# Patient Record
Sex: Female | Born: 1955 | Race: White | Hispanic: No | Smoking: Never smoker
Health system: Southern US, Community
[De-identification: ages and names within clinical notes are randomized; demographics above are authoritative.]

## PROBLEM LIST (undated history)

## (undated) DIAGNOSIS — R609 Edema, unspecified: Secondary | ICD-10-CM

## (undated) DIAGNOSIS — I10 Essential (primary) hypertension: Secondary | ICD-10-CM

## (undated) DIAGNOSIS — I83899 Varicose veins of unspecified lower extremities with other complications: Secondary | ICD-10-CM

## (undated) DIAGNOSIS — E0500 Thyrotoxicosis with diffuse goiter without thyrotoxic crisis or storm: Secondary | ICD-10-CM

## (undated) DIAGNOSIS — I83893 Varicose veins of bilateral lower extremities with other complications: Secondary | ICD-10-CM

## (undated) DIAGNOSIS — R600 Localized edema: Secondary | ICD-10-CM

## (undated) DIAGNOSIS — D649 Anemia, unspecified: Secondary | ICD-10-CM

## (undated) DIAGNOSIS — L309 Dermatitis, unspecified: Secondary | ICD-10-CM

## (undated) DIAGNOSIS — E785 Hyperlipidemia, unspecified: Secondary | ICD-10-CM

## (undated) DIAGNOSIS — E669 Obesity, unspecified: Secondary | ICD-10-CM

## (undated) DIAGNOSIS — I8390 Asymptomatic varicose veins of unspecified lower extremity: Secondary | ICD-10-CM

## (undated) DIAGNOSIS — I319 Disease of pericardium, unspecified: Secondary | ICD-10-CM

## (undated) DIAGNOSIS — E079 Disorder of thyroid, unspecified: Secondary | ICD-10-CM

## (undated) HISTORY — DX: Essential (primary) hypertension: I10

## (undated) HISTORY — DX: Obesity, unspecified: E66.9

## (undated) HISTORY — DX: Edema, unspecified: R60.9

## (undated) HISTORY — PX: BREAST BIOPSY: SHX20

## (undated) HISTORY — DX: Thyrotoxicosis with diffuse goiter without thyrotoxic crisis or storm: E05.00

## (undated) HISTORY — DX: Varicose veins of bilateral lower extremities with other complications: I83.893

## (undated) HISTORY — PX: SKIN GRAFT: SHX250

## (undated) HISTORY — DX: Dermatitis, unspecified: L30.9

## (undated) HISTORY — DX: Varicose veins of unspecified lower extremity with other complications: I83.899

## (undated) HISTORY — DX: Localized edema: R60.0

## (undated) HISTORY — DX: Disorder of thyroid, unspecified: E07.9

## (undated) HISTORY — DX: Asymptomatic varicose veins of unspecified lower extremity: I83.90

## (undated) HISTORY — DX: Hyperlipidemia, unspecified: E78.5

## (undated) HISTORY — DX: Anemia, unspecified: D64.9

## (undated) HISTORY — DX: Disease of pericardium, unspecified: I31.9

## (undated) HISTORY — PX: CHOLECYSTECTOMY: SHX55

## (undated) HISTORY — PX: ENDOVENOUS ABLATION SAPHENOUS VEIN W/ LASER: SUR449

---

## 1999-11-23 ENCOUNTER — Ambulatory Visit (HOSPITAL_COMMUNITY): Admission: RE | Admit: 1999-11-23 | Discharge: 1999-11-23 | Payer: Self-pay | Admitting: Orthopedic Surgery

## 1999-11-23 ENCOUNTER — Encounter: Payer: Self-pay | Admitting: Orthopedic Surgery

## 1999-12-06 ENCOUNTER — Other Ambulatory Visit: Admission: RE | Admit: 1999-12-06 | Discharge: 1999-12-06 | Payer: Self-pay | Admitting: Gynecology

## 2001-06-03 ENCOUNTER — Other Ambulatory Visit: Admission: RE | Admit: 2001-06-03 | Discharge: 2001-06-03 | Payer: Self-pay | Admitting: Gynecology

## 2002-09-11 ENCOUNTER — Encounter: Payer: Self-pay | Admitting: Family Medicine

## 2002-09-11 ENCOUNTER — Encounter: Admission: RE | Admit: 2002-09-11 | Discharge: 2002-09-11 | Payer: Self-pay | Admitting: Family Medicine

## 2002-11-02 ENCOUNTER — Other Ambulatory Visit: Admission: RE | Admit: 2002-11-02 | Discharge: 2002-11-02 | Payer: Self-pay | Admitting: Gynecology

## 2003-05-07 ENCOUNTER — Encounter: Admission: RE | Admit: 2003-05-07 | Discharge: 2003-05-07 | Payer: Self-pay | Admitting: Orthopedic Surgery

## 2004-04-20 ENCOUNTER — Other Ambulatory Visit: Admission: RE | Admit: 2004-04-20 | Discharge: 2004-04-20 | Payer: Self-pay | Admitting: Gynecology

## 2009-01-10 ENCOUNTER — Encounter: Admission: RE | Admit: 2009-01-10 | Discharge: 2009-01-10 | Payer: Self-pay | Admitting: Gynecology

## 2010-04-17 ENCOUNTER — Other Ambulatory Visit: Payer: Self-pay | Admitting: Gynecology

## 2010-04-17 DIAGNOSIS — Z1231 Encounter for screening mammogram for malignant neoplasm of breast: Secondary | ICD-10-CM

## 2010-04-19 ENCOUNTER — Ambulatory Visit
Admission: RE | Admit: 2010-04-19 | Discharge: 2010-04-19 | Disposition: A | Discharge status: Home / Self Care | Payer: Commercial Indemnity | Source: Ambulatory Visit | Attending: Gynecology | Admitting: Gynecology

## 2010-04-19 DIAGNOSIS — Z1231 Encounter for screening mammogram for malignant neoplasm of breast: Secondary | ICD-10-CM

## 2010-05-04 ENCOUNTER — Other Ambulatory Visit: Payer: Self-pay | Admitting: Gynecology

## 2010-05-04 DIAGNOSIS — N950 Postmenopausal bleeding: Secondary | ICD-10-CM

## 2010-05-10 ENCOUNTER — Ambulatory Visit
Admission: RE | Admit: 2010-05-10 | Discharge: 2010-05-10 | Disposition: A | Discharge status: Home / Self Care | Payer: Commercial Indemnity | Source: Ambulatory Visit | Attending: Gynecology | Admitting: Gynecology

## 2010-05-10 DIAGNOSIS — N950 Postmenopausal bleeding: Secondary | ICD-10-CM

## 2010-05-30 ENCOUNTER — Observation Stay (HOSPITAL_COMMUNITY)
Admission: EM | Admit: 2010-05-30 | Discharge: 2010-05-31 | Disposition: A | Discharge status: Home / Self Care | Payer: Commercial Indemnity | Attending: Internal Medicine | Admitting: Internal Medicine

## 2010-05-30 ENCOUNTER — Emergency Department (HOSPITAL_COMMUNITY): Payer: Commercial Indemnity

## 2010-05-30 DIAGNOSIS — E0500 Thyrotoxicosis with diffuse goiter without thyrotoxic crisis or storm: Secondary | ICD-10-CM | POA: Insufficient documentation

## 2010-05-30 DIAGNOSIS — R030 Elevated blood-pressure reading, without diagnosis of hypertension: Secondary | ICD-10-CM | POA: Insufficient documentation

## 2010-05-30 DIAGNOSIS — E669 Obesity, unspecified: Secondary | ICD-10-CM | POA: Insufficient documentation

## 2010-05-30 DIAGNOSIS — R0789 Other chest pain: Principal | ICD-10-CM | POA: Insufficient documentation

## 2010-05-30 LAB — POCT I-STAT, CHEM 8
HCT: 36 % (ref 36.0–46.0)
Hemoglobin: 12.2 g/dL (ref 12.0–15.0)
Potassium: 3.8 mEq/L (ref 3.5–5.1)
Sodium: 140 mEq/L (ref 135–145)
TCO2: 27 mmol/L (ref 0–100)

## 2010-05-30 LAB — DIFFERENTIAL
Basophils Absolute: 0.1 10*3/uL (ref 0.0–0.1)
Basophils Relative: 1 % (ref 0–1)
Lymphocytes Relative: 31 % (ref 12–46)
Monocytes Absolute: 0.6 10*3/uL (ref 0.1–1.0)
Neutro Abs: 4.8 10*3/uL (ref 1.7–7.7)

## 2010-05-30 LAB — CBC
HCT: 37.2 % (ref 36.0–46.0)
Hemoglobin: 12.7 g/dL (ref 12.0–15.0)
MCHC: 34.1 g/dL (ref 30.0–36.0)
RDW: 14.1 % (ref 11.5–15.5)
WBC: 8 10*3/uL (ref 4.0–10.5)

## 2010-05-30 LAB — POCT CARDIAC MARKERS
CKMB, poc: 2.8 ng/mL (ref 1.0–8.0)
Myoglobin, poc: 84 ng/mL (ref 12–200)

## 2010-05-31 DIAGNOSIS — R072 Precordial pain: Secondary | ICD-10-CM

## 2010-05-31 LAB — COMPREHENSIVE METABOLIC PANEL
AST: 29 U/L (ref 0–37)
BUN: 15 mg/dL (ref 6–23)
CO2: 26 mEq/L (ref 19–32)
Calcium: 8.9 mg/dL (ref 8.4–10.5)
Chloride: 106 mEq/L (ref 96–112)
Creatinine, Ser: 0.85 mg/dL (ref 0.4–1.2)
GFR calc non Af Amer: >60 mL/min (ref >60)
Glucose, Bld: 108 mg/dL — ABNORMAL HIGH (ref 70–99)
Total Bilirubin: 0.8 mg/dL (ref 0.3–1.2)

## 2010-05-31 LAB — CARDIAC PANEL(CRET KIN+CKTOT+MB+TROPI)
CK, MB: 2.5 ng/mL (ref 0.3–4.0)
Relative Index: INVALID (ref 0.0–2.5)
Relative Index: 2.1 (ref 0.0–2.5)
Total CK: 120 U/L (ref 7–177)
Troponin I: 0.02 ng/mL (ref 0.00–0.06)

## 2010-05-31 LAB — CBC
MCH: 29.2 pg (ref 26.0–34.0)
MCHC: 33.5 g/dL (ref 30.0–36.0)
Platelets: 227 10*3/uL (ref 150–400)
RDW: 14.2 % (ref 11.5–15.5)

## 2010-05-31 LAB — CK TOTAL AND CKMB (NOT AT ARMC)
Relative Index: INVALID (ref 0.0–2.5)
Total CK: 98 U/L (ref 7–177)

## 2010-05-31 LAB — LIPID PANEL
Cholesterol: 189 mg/dL (ref 0–200)
HDL: 36 mg/dL — ABNORMAL LOW (ref >39)
LDL Cholesterol: 115 mg/dL — ABNORMAL HIGH (ref 0–99)
Total CHOL/HDL Ratio: 5.3 RATIO
VLDL: 38 mg/dL (ref 0–40)

## 2010-05-31 LAB — POCT CARDIAC MARKERS: Myoglobin, poc: 81.9 ng/mL (ref 12–200)

## 2010-05-31 LAB — MAGNESIUM: Magnesium: 2.1 mg/dL (ref 1.5–2.5)

## 2010-05-31 LAB — TSH: TSH: 2.409 u[IU]/mL (ref 0.350–4.500)

## 2010-05-31 NOTE — H&P (Signed)
NAME:  Cheryl [name], Cheryl [name]                ACCOUNT NO.:  [id number]  MEDICAL RECORD NO.:  [id number]           PATIENT TYPE:  E  LOCATION:  [facility]                         FACILITY:  [facility]  PHYSICIAN:  Talmage Nap, MD  DATE OF BIRTH:  1955-09-06  DATE OF ADMISSION:  05/30/2010 DATE OF DISCHARGE:                             HISTORY & PHYSICAL   PRIMARY CARE PHYSICIAN:  Unassigned.  History obtainable from the patient.  CHIEF COMPLAINT:  Chest pain of 1 day duration.  The patient is a 55 year old obese Caucasian female with history of Graves disease on Synthroid replacement presenting to the emergency room with chest pain of 1 day duration, which started 24 hours prior to presentation.  The patient claimed that she was at home, at rest and developed retrosternal chest pain about 4/10 in intensity and was nonradiating.  This was said to be associated with feeling of gas in her chest.  She sometimes described the pain as pressure like.  There was no radiation of pain, but the patient claimed she had some discomfort on deep inspiration.  She denied any history of diaphoresis.  She denied any history of nausea or vomiting.  No fever or chills.  No rigor.  Pain was said to have been transient and subsided.  There was no known relieving or aggravating factor. However, the patient had a second episode today and subsequently decided to come to the emergency room to be evaluated.  PAST MEDICAL HISTORY:  Positive for Graves disease.  PAST SURGICAL HISTORY:  Cholecystectomy and skin graft following a washing machine accident.  Preadmission meds include aspirin dose unknown, Synthroid dose unknown.  ALLERGIES:  COUGH SYRUP with ANTIHISTAMINE.  SOCIAL HISTORY:  Negative for alcohol or tobacco use.  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  Family history is said to be positive for coronary artery disease.  REVIEW OF SYSTEMS:  The patient denies any  history of headaches.  No blurred vision.  No nausea or vomiting.  No fever.  No chills.  No rigor.  Presently denies any chest pain or discomfort, but claimed that the pain is now in the precordial region.  Denies any radiation of pain. No cough.  Denies any PND or orthopnea.  No abdominal discomfort.  No diarrhea or hematochezia.  No dysuria or hematuria.  Periodically have swelling of the lower extremity with area of irregular hyperemia in the lower extremity.  No intolerance to heat, cold.  No neuropsychiatric disorder.  PHYSICAL EXAMINATION:  GENERAL:  On examination, very pleasant middle- aged lady, not in any obvious respiratory distress. VITAL SIGNS:  Blood pressure is 192/92, pulse 83, respiratory rate is 19, temperature is 97.4. HEENT:  Pupils are reactive to light and extraocular muscles are intact. NECK:  No jugular venous distention.  No carotid bruit.  No lymphadenopathy. CHEST:  Clear to auscultation. HEART:  Heart sounds are 1 and 2. ABDOMEN:  Soft, nontender.  Liver, spleen, kidney not palpable.  Bowel sounds are positive. EXTREMITIES:  Trace edema with areas of irregular hyperemia in the lower leg.  No intolerance to heat or cold.  NEURO:  No neuropsychiatric disorder.  LABORATORY DATA:  First set of cardiac markers; troponin-I less than 0.05, CK-MB 2.8.  Hematological indices showed WBC of 8.0, hemoglobin of 12.7, hematocrit 37.2, MCV 87.2, platelet count of 232, normal differentials.  Chemistry shows sodium of 140, potassium of 3.8, chloride of 105, BUN is 18, creatinine is 1.0, glucose is 92.  Imaging studies done include chest x-ray, which showed borderline cardiomegaly without any acute disease.  EKG showed normal sinus rhythm with nonspecific ST-wave changes in the anterolateral leads.  ADMITTING IMPRESSION: 1. Chest pain, rule out acute coronary syndrome. 2. Graves disease. 3. Obesity. 4. Elevated blood pressure. 5. 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  PLAN:  Admit the patient to telemetry.  The patient will be on aspirin 325 mg p.o. daily, morphine 2 mg IV q.4 p.r.n. for chest pain, and nitroglycerin 0.4 mg sublingual p.r.n. for chest pain.  She will also be restarted on Synthroid 50 mcg p.o. daily for now, when exact dose is known will be changed.  Elevated blood pressure will be controlled with lisinopril 10 mg p.o. daily.  She will be on Protonix 40 mg p.o. daily for GI prophylaxis as well as Mylanta 10 mL p.o. t.i.d. for dyspepsia. GI prophylaxis will be with Lovenox 40 mg subcu q.24 h.  Further labs to be ordered on this patient will include cardiac enzymes q.6 x3, lipid panel, thyroid panel, which include TSH, T3, and T4, 2-D echo will also be done.  CBC, CMP, and magnesium will be repeated in a.m.  The patient will be followed and evaluated on day-to-day basis.     Talmage Nap, MD     CN/MEDQ  D:  05/30/2010  T:  05/30/2010  Job:  [id number]  Electronically Signed by Talmage Nap  on 05/31/2010 01:01:53 AM

## 2010-06-02 NOTE — Discharge Summary (Signed)
NAME:  Cheryl [name], Cheryl [name]                ACCOUNT NO.:  [id number]  MEDICAL RECORD NO.:  [id number]           PATIENT TYPE:  I  LOCATION:  2020                         FACILITY:  [facility]  PHYSICIAN:  Marinda Elk, M.D.DATE OF BIRTH:  February 09, 1955  DATE OF ADMISSION:  05/30/2010 DATE OF DISCHARGE:  05/31/2010                              DISCHARGE SUMMARY   PRIMARY CARE DOCTOR:  Dr. Andrey Campanile over at [facility].  DISCHARGE DIAGNOSES: 1. Atypical chest pain with cardiac markers negative x3.  EKG with     sinus rhythm. 2. Graves disease, 3. Elevated blood pressure.  DISCHARGE MEDICATIONS: 1. Tylenol 500 mg p.o. at bedtime p.r.n. 2. Aspirin 325 mg daily. 3. Synthroid 175 mcg daily. 4. Vitamin B6 1 tablet daily. 5. Pravachol 40 mg p.o. daily.  PROCEDURES PERFORMED: 1. Two-D echo which is pending at the time of the dictation, which     will be followed up by Dr. Andrey Campanile. 2. Chest x-ray that showed borderline cardiomegaly without any acute     pulmonary disease.  CONSULTANTS:  None.  BRIEF ADMITTING H AND P:  This is a 55 year old female with past medical history of Graves disease, who came to the emergency room for chest pain 1 day prior to coming to the hospital, it is intermittent.  She relates she was at home.  She was sitting in chair when she developed retrosternal chest pain, 4/10, nonradiating, was associated with gas in her chest.  She describes sometimes the pain as pressure like, but the patient claimed that it is some discomfort with inspiration.  She denies any diaphoresis.  Denies any history of nausea, vomiting, chills, rigor. There were no alleviating or aggravating factors.  This happened twice, lasted about a minute or so.  PHYSICAL EXAMINATION:  VITAL SIGNS:  Blood pressure 192/92, pulse of 83, respiration of 19, temperature 97.  HEENT:  Pupils equally round and reactive to light. NECK:  No JVD.  No carotid bruits.  No  thyromegaly. CARDIOVASCULAR:  She has a regular rate and rhythm with distant heart sounds, S1 and S2. LUNGS:  Good air movement.  Clear to auscultation. ABDOMEN:  Positive bowel sounds, nontender, nondistended, soft. EXTREMITIES:  Trace edema with areas of hyperemia in the legs and two knots posterior.  It looks like chronic venous insufficiency. NEUROLOGIC:  Nonfocal.  Labs on admission showed white count of 8.0, hemoglobin of 12, platelet count of 232, ANC of 4.8.  Her two sets of point of cardiac care markers are negative x2.  Her triglycerides are 190, HDL 36, LDL 115.  Her white count is 6.1, hemoglobin of 12.3, platelet count of 227.  Third set of cardiac enzymes is negative.  Sodium 141, potassium 4.1, chloride 106, bicarb 26, glucose 108, BUN of 15, creatinine 0.5.  LFTs within normal limits except for an AST, which is 50.  Her mag is 2.1.  ASSESSMENT AND PLAN: 1. Atypical chest pain.  Monitor on telemetry with no events,     currently in sinus rhythm with nonspecific T-wave changes on EKG,     but in V2.  She has a normal  axis, normal intervals, and no ST-     segment changes.  Cardiac enzymes were negative x3 repeated after     the point of cardiac care markers.  The chest pain is reproducible     by palpation.  A two-D echo is pending at the time of the     dictation.  She will be followed up with [facility] for     stress test as an outpatient.  Results will be sent to her primary     care doctor. 2. Graves disease.  Continue Synthroid.  No changes were made. 3. Elevated blood pressure.  She was started on lisinopril.  On     admission, her blood pressure was 190, now in the hospital was     95/58.  This is just one measurement.  I will not start her on any     blood pressure medication at this time. 4. Hyperlipidemia.  We will start her on statin, Pravachol.  Her LFTs     were checked and were within normal limits.  LABS ON THE DAY OF DISCHARGE:  Cardiac  enzymes continued to be negative. They were TI 0.01, before that it was 0.01.  Her MB was 2.5, before that it was 3.4.  Her TSH was checked, it was 2.4.  Her T3 and T4 was 1.2.  Vitals on the day of discharge show a temperature 98, pulse 76, respirations 18, blood pressure 95/58, she was satting 95% on room air.  DISPOSITION:  The patient will follow up with [facility] cardiologist for an outpatient stress test, and the results will be sent to her primary care doctor.     Marinda Elk, M.D.     AF/MEDQ  D:  05/31/2010  T:  06/01/2010  Job:  [id number]  cc:   Dr. Andrey Campanile  Electronically Signed by Marinda Elk M.D. on 06/02/2010 04:07:36 PM

## 2010-06-14 ENCOUNTER — Encounter: Payer: Self-pay | Admitting: Cardiovascular Disease

## 2010-06-15 ENCOUNTER — Encounter: Payer: Self-pay | Admitting: Cardiovascular Disease

## 2010-06-15 ENCOUNTER — Ambulatory Visit (INDEPENDENT_AMBULATORY_CARE_PROVIDER_SITE_OTHER): Payer: Commercial Indemnity | Admitting: Cardiovascular Disease

## 2010-06-15 DIAGNOSIS — R609 Edema, unspecified: Secondary | ICD-10-CM | POA: Insufficient documentation

## 2010-06-15 DIAGNOSIS — E079 Disorder of thyroid, unspecified: Secondary | ICD-10-CM

## 2010-06-15 DIAGNOSIS — R079 Chest pain, unspecified: Secondary | ICD-10-CM

## 2010-06-15 HISTORY — DX: Edema, unspecified: R60.9

## 2010-06-15 HISTORY — DX: Disorder of thyroid, unspecified: E07.9

## 2010-06-15 NOTE — Assessment & Plan Note (Signed)
F/U Dr Sharl Ma.  TSH normal in hospital

## 2010-06-15 NOTE — Assessment & Plan Note (Signed)
Atypical but required hospitalization and abnormal ECG.  F/U myovue

## 2010-06-15 NOTE — Assessment & Plan Note (Signed)
Likely venous isuff.  F/U Dr Andrey Campanile consider diruetic RX and support hose.  She declines duplex

## 2010-06-15 NOTE — Patient Instructions (Signed)
Your physician has requested that you have en exercise stress myoview. For further information please visit [email or web address]. Please follow instruction sheet, as given.   

## 2010-06-15 NOTE — Progress Notes (Signed)
Referred post hospital for SSCP 4/24 Atypical pain R/O Echo with AV sclerosis.  Pain worse with inspiration.  Both shoulders and epigastric area.  Only taking ASA not NSAI's.  ECG in hospital with nonspecific ST/T wave changes.  Pain persists post D/C but not worse.  Makes it hard for her to sleep at night as pain can be positional.  No dyspnea, palpitations, edema or syncope.  Anxiety as two of her sisters were recently diagnosed with cancer ( breast and uterine)  No previous ETT Reviewed hospital D/C summary, echo, ECG and labs.  TSH was 2.4 and has outpt F/U for her thyroid disease with Dr Sharl Ma.  Has LE edema and venous insuficiency.  Offerred to work this up further but she declined due to cost.  Encouraged her to F/U with Dr Andrey Campanile and consider a diuretic for LE edema.  ROS: Denies fever, malais, weight loss, blurry vision, decreased visual acuity, cough, sputum, SOB, hemoptysis, pleuritic pain, palpitaitons, heartburn, abdominal pain, melena, lower extremity edema, claudication, or rash.   General: Affect appropriate Healthy:  appears stated age HEENT: normal Neck supple with no adenopathy JVP normal no bruits no thyromegaly Lungs clear with no wheezing and good diaphragmatic motion Heart:  S1/S2 SEM no ,rub, gallop or click PMI normal Abdomen: benighn, BS positve, no tenderness, no AAA no bruit.  No HSM or HJR Distal pulses intact with no bruits Plus one bilateral edema with varicosities Neuro non-focal Skin warm and dry No muscular weakness  Medications Current Outpatient Prescriptions  Medication Sig Dispense Refill  . acetaminophen (TYLENOL) 500 MG tablet Take 500 mg by mouth every 6 (six) hours as needed.        Marland Kitchen aspirin 325 MG tablet Take 325 mg by mouth daily.        Marland Kitchen ibuprofen (ADVIL,MOTRIN) 200 MG tablet Take 200 mg by mouth every 6 (six) hours as needed.        Marland Kitchen levothyroxine (SYNTHROID, LEVOTHROID) 175 MCG tablet Take 175 mcg by mouth daily.        . pravastatin  (PRAVACHOL) 40 MG tablet Take 40 mg by mouth daily.        . Pyridoxine HCl (VITAMIN B-6 PO) Take by mouth daily.          Allergies Review of patient's allergies indicates no known allergies.  Family History: Family History  Problem Relation Age of Onset  . Coronary artery disease      Social History: History   Social History  . Marital Status: Married    Spouse Name: N/A    Number of Children: N/A  . Years of Education: N/A   Occupational History  . automobile purifiers    Social History Main Topics  . Smoking status: Never Smoker   . Smokeless tobacco: Not on file  . Alcohol Use: No  . Drug Use: Not on file  . Sexually Active: Not on file   Other Topics Concern  . Not on file   Social History Narrative  . No narrative on file    Electrocardiogram:  NSR nonspecific ST/T wave changes   Assessment and Plan

## 2010-06-20 ENCOUNTER — Ambulatory Visit: Payer: Commercial Indemnity | Admitting: Physician Assistant

## 2010-06-20 ENCOUNTER — Other Ambulatory Visit (HOSPITAL_COMMUNITY): Payer: Commercial Indemnity | Admitting: Radiology

## 2010-06-22 ENCOUNTER — Ambulatory Visit (HOSPITAL_COMMUNITY): Payer: Commercial Indemnity | Attending: Cardiovascular Disease | Admitting: Radiology

## 2010-06-22 DIAGNOSIS — R0789 Other chest pain: Secondary | ICD-10-CM

## 2010-06-22 DIAGNOSIS — R0989 Other specified symptoms and signs involving the circulatory and respiratory systems: Secondary | ICD-10-CM

## 2010-06-22 DIAGNOSIS — R9431 Abnormal electrocardiogram [ECG] [EKG]: Secondary | ICD-10-CM

## 2010-06-22 MED ORDER — TECHNETIUM TC 99M TETROFOSMIN IV KIT
33.0000 | PACK | Freq: Once | INTRAVENOUS | Status: AC | PRN
  Administered 2010-06-22: 33 via INTRAVENOUS

## 2010-06-22 NOTE — Progress Notes (Signed)
[facility] SITE 3 NUCLEAR MED [address] [place] [address] [phone]  Cardiology Nuclear Med Study  Cheryl [name] is a 55 y.o. female [id number] 1955/11/12   Nuclear Med Background Indication for Stress Test:  Evaluation for Ischemia, Post Hospital 05/30/10 CP, (-) enzymes and Abnormal EKG History:  '05/31/10 Echo:EF=60% Cardiac Risk Factors: Family History - CAD, Hypertension, Lipids and Obesity  Symptoms:  Chest Pain, Chest Pressure.  (last episode of chest discomfort was this a.m., 6/10; none now), DOE and Fatigue   Nuclear Pre-Procedure Caffeine/Decaff Intake:  None NPO After: 12:00am   Lungs:  Clear IV 0.9% NS with Angio Cath:  22g  IV Site: R Antecubital  IV Started by:  Burna Mortimer Deal, RT-N  Chest Size (in):  46 Cup Size: D  Height: 5\' 7"  (1.702 m)  Weight:  284 lb (128.822 kg)  BMI:  Body mass index is 44.48 kg/(m^2). Tech Comments:  n/a    Nuclear Med Study 1 or 2 day study: 2 day  Stress Test Type:  Stress  Reading MD: Cassell Clement MD  Order Authorizing Provider:  Charlton Haws, MD  Resting Radionuclide: Technetium 49m Tetrofosmin  Resting Radionuclide Dose: 33 mCi   Stress Radionuclide:  Technetium 69m Tetrofosmin  Stress Radionuclide Dose: 33 mCi           Stress Protocol Rest HR: 98 Stress HR: 141  Rest BP: 117/77 Stress BP: 175/69  Exercise Time (min): 3:31 METS: 5.0   Predicted Max HR: 167 bpm % Max HR: 84.43 bpm Rate Pressure Product: 21308   Dose of Adenosine (mg):  n/a Dose of Lexiscan: n/a mg  Dose of Atropine (mg): n/a Dose of Dobutamine: n/a mcg/kg/min (at max HR)  Stress Test Technologist: Smiley Houseman, CMA-N  Nuclear Technologist:  Doyne Keel, CNMT     Rest Procedure:  Myocardial perfusion imaging was performed at rest 45 minutes following the intravenous administration of Technetium 88m Tetrofosmin.  Rest ECG: Nonspecific T-wave changes.  Stress Procedure:  The patient exercised for 3:31 on the treadmill  utilizing the Bruce protocol.  The patient stopped due to fatigue and denied any chest pain.  There were no significant ST-T wave changes.  She did c/o chest tightness, 4-5/10, with exercise.  Technetium 53m Tetrofosmin was injected at peak exercise and myocardial perfusion imaging was performed after a brief delay.  Stress ECG: No significant change from baseline ECG  QPS Raw Data Images:  Normal; no motion artifact; normal heart/lung ratio. Stress Images:  Normal homogeneous uptake in all areas of the myocardium. Rest Images:  Normal homogeneous uptake in all areas of the myocardium. Subtraction (SDS):  No evidence of ischemia. Transient Ischemic Dilatation (Normal <1.22): .78  Lung/Heart Ratio (Normal <0.45):  .31   Quantitative Gated Spect Images QGS EDV:  73 ml QGS ESV:  23 ml QGS cine images:  NL LV Function; NL Wall Motion QGS EF: 69%  Impression Exercise Capacity:  Poor exercise capacity. BP Response:  Normal blood pressure response. Clinical Symptoms:  Mild chest pain/dyspnea. ECG Impression:  No significant ST segment change suggestive of ischemia. Comparison with Prior Nuclear Study: No previous nuclear study performed  Overall Impression:  Normal stress nuclear study.      Cassell Clement

## 2010-06-26 ENCOUNTER — Inpatient Hospital Stay (HOSPITAL_COMMUNITY)
Admission: EM | Admit: 2010-06-26 | Discharge: 2010-06-30 | DRG: 187 | Disposition: A | Discharge status: Home / Self Care | Payer: Commercial Indemnity | Source: Ambulatory Visit | Attending: Internal Medicine | Admitting: Internal Medicine

## 2010-06-26 ENCOUNTER — Emergency Department (HOSPITAL_COMMUNITY): Payer: Commercial Indemnity

## 2010-06-26 DIAGNOSIS — I319 Disease of pericardium, unspecified: Secondary | ICD-10-CM | POA: Diagnosis present

## 2010-06-26 DIAGNOSIS — E039 Hypothyroidism, unspecified: Secondary | ICD-10-CM | POA: Diagnosis present

## 2010-06-26 DIAGNOSIS — J90 Pleural effusion, not elsewhere classified: Principal | ICD-10-CM | POA: Diagnosis present

## 2010-06-26 DIAGNOSIS — E0500 Thyrotoxicosis with diffuse goiter without thyrotoxic crisis or storm: Secondary | ICD-10-CM | POA: Diagnosis present

## 2010-06-26 DIAGNOSIS — Z6835 Body mass index (BMI) 35.0-35.9, adult: Secondary | ICD-10-CM

## 2010-06-26 DIAGNOSIS — Z7982 Long term (current) use of aspirin: Secondary | ICD-10-CM

## 2010-06-26 DIAGNOSIS — E785 Hyperlipidemia, unspecified: Secondary | ICD-10-CM | POA: Diagnosis present

## 2010-06-26 LAB — DIFFERENTIAL
Basophils Absolute: 0 10*3/uL (ref 0.0–0.1)
Eosinophils Relative: 0 % (ref 0–5)
Lymphocytes Relative: 20 % (ref 12–46)
Neutrophils Relative %: 73 % (ref 43–77)

## 2010-06-26 LAB — CBC
HCT: 30.7 % — ABNORMAL LOW (ref 36.0–46.0)
Hemoglobin: 10.1 g/dL — ABNORMAL LOW (ref 12.0–15.0)
MCH: 28.5 pg (ref 26.0–34.0)
MCHC: 32.9 g/dL (ref 30.0–36.0)
MCV: 86.7 fL (ref 78.0–100.0)
Platelets: 355 10*3/uL (ref 150–400)
RBC: 3.54 MIL/uL — ABNORMAL LOW (ref 3.87–5.11)
RDW: 13.7 % (ref 11.5–15.5)
WBC: 10.4 10*3/uL (ref 4.0–10.5)

## 2010-06-26 LAB — HEPATIC FUNCTION PANEL
ALT: 141 U/L — ABNORMAL HIGH (ref 0–35)
AST: 66 U/L — ABNORMAL HIGH (ref 0–37)
Bilirubin, Direct: 0.3 mg/dL (ref 0.0–0.3)
Total Bilirubin: 0.8 mg/dL (ref 0.3–1.2)

## 2010-06-26 LAB — POCT CARDIAC MARKERS
CKMB, poc: 1.3 ng/mL (ref 1.0–8.0)
Myoglobin, poc: 78.5 ng/mL (ref 12–200)
Troponin i, poc: <0.05 ng/mL (ref 0.00–0.09)

## 2010-06-26 LAB — BASIC METABOLIC PANEL
BUN: 11 mg/dL (ref 6–23)
Calcium: 9 mg/dL (ref 8.4–10.5)
GFR calc non Af Amer: >60 mL/min (ref >60)
Glucose, Bld: 106 mg/dL — ABNORMAL HIGH (ref 70–99)
Sodium: 138 mEq/L (ref 135–145)

## 2010-06-27 ENCOUNTER — Encounter (HOSPITAL_COMMUNITY): Payer: Commercial Indemnity | Admitting: Radiology

## 2010-06-27 ENCOUNTER — Emergency Department (HOSPITAL_COMMUNITY): Payer: Commercial Indemnity

## 2010-06-27 DIAGNOSIS — R079 Chest pain, unspecified: Secondary | ICD-10-CM

## 2010-06-27 LAB — CARDIAC PANEL(CRET KIN+CKTOT+MB+TROPI)
CK, MB: 1.5 ng/mL (ref 0.3–4.0)
CK, MB: 1.6 ng/mL (ref 0.3–4.0)
Relative Index: INVALID (ref 0.0–2.5)
Total CK: 62 U/L (ref 7–177)
Troponin I: <0.3 ng/mL (ref <0.30)

## 2010-06-27 MED ORDER — IOHEXOL 350 MG/ML SOLN
100.0000 mL | Freq: Once | INTRAVENOUS | Status: AC | PRN
  Administered 2010-06-27: 100 mL via INTRAVENOUS

## 2010-06-28 DIAGNOSIS — I319 Disease of pericardium, unspecified: Secondary | ICD-10-CM

## 2010-06-28 LAB — COMPREHENSIVE METABOLIC PANEL
Albumin: 3.1 g/dL — ABNORMAL LOW (ref 3.5–5.2)
Alkaline Phosphatase: 166 U/L — ABNORMAL HIGH (ref 39–117)
BUN: 15 mg/dL (ref 6–23)
Chloride: 100 mEq/L (ref 96–112)
Glucose, Bld: 121 mg/dL — ABNORMAL HIGH (ref 70–99)
Potassium: 3.7 mEq/L (ref 3.5–5.1)
Total Bilirubin: 0.6 mg/dL (ref 0.3–1.2)

## 2010-06-29 LAB — COMPREHENSIVE METABOLIC PANEL
ALT: 93 U/L — ABNORMAL HIGH (ref 0–35)
AST: 23 U/L (ref 0–37)
Albumin: 3 g/dL — ABNORMAL LOW (ref 3.5–5.2)
Alkaline Phosphatase: 152 U/L — ABNORMAL HIGH (ref 39–117)
BUN: 18 mg/dL (ref 6–23)
Chloride: 101 mEq/L (ref 96–112)
Potassium: 4.3 mEq/L (ref 3.5–5.1)
Total Bilirubin: 0.3 mg/dL (ref 0.3–1.2)

## 2010-06-29 LAB — APTT: aPTT: 29 seconds (ref 24–37)

## 2010-06-29 LAB — RHEUMATOID FACTOR: Rhuematoid fact SerPl-aCnc: <10 IU/mL (ref <=14)

## 2010-06-30 LAB — ANTIPHOSPHOLIPID SYNDROME EVAL, BLD
Anticardiolipin IgA: 3 APL U/mL — ABNORMAL LOW (ref <22)
Anticardiolipin IgG: 6 GPL U/mL — ABNORMAL LOW (ref <23)
Anticardiolipin IgM: 3 MPL U/mL — ABNORMAL LOW (ref <11)
PTT Lupus Anticoagulant: 36.9 secs (ref 30.0–45.6)
Phosphatydalserine, IgA: <20 U/mL (ref <20)
Phosphatydalserine, IgG: <10 U/mL (ref <10)
Phosphatydalserine, IgM: <25 U/mL (ref <25)

## 2010-06-30 LAB — MITOCHONDRIAL ANTIBODIES: Mitochondrial M2 Ab, IgG: 0.09 (ref <0.91)

## 2010-06-30 LAB — HEPATITIS PANEL, ACUTE: Hep B C IgM: NEGATIVE

## 2010-07-01 NOTE — Discharge Summary (Signed)
NAME:  Cheryl [name], Cheryl [name]                ACCOUNT NO.:  [id number]  MEDICAL RECORD NO.:  [id number]           PATIENT TYPE:  I  LOCATION:  3702                         FACILITY:  [facility]  PHYSICIAN:  Conley Canal, MD      DATE OF BIRTH:  1955/06/25  DATE OF ADMISSION:  06/26/2010 DATE OF DISCHARGE:  06/30/2010                        DISCHARGE SUMMARY - REFERRING   PRIMARY CARE PHYSICIAN:  Dr. Benedetto Goad with [facility].  CONSULTING PHYSICIAN:  Dr. Charlton Haws, Cardiology.  DISCHARGE DIAGNOSES: 1. Pleuropericardial effusion,  moderate.  Etiology unclear.  No     clinical evidence of tamponade. 2. Hypothyroidism. 3. Hyperlipidemia. 4. Transaminitis may be combination of medications including Tylenol     and statins. 5. Morbid obesity.  BMI greater than 35. 6. Graves disease, status post radioactive iodine therapy. 7. Status post cholecystectomy, status post IUD. 8. Hyperlipidemia.  DISCHARGE MEDICATIONS: 1. Colchicine 0.6 mg twice daily. 2. Lasix 20 mg daily. 3. Naproxen 500 mg 3 times daily. 4. Protonix 40 mg daily. 5. KCl 20 mEq daily. 6. Aspirin 325 mg daily. 7. Synthroid 175 mcg daily. 8. Prednisone taper.  PROCEDURES PERFORMED: 1. CT of the chest with contrast on 05/22 as well as CT abdomen and     pelvis with contrast 05/22 showing no evidence of pulmonary     embolism and mild bibasilar linear atelectasis as well as small to     moderate pericardial effusion. 2. The 2-D echocardiogram on 05/23 showing EF 55% to 60% with grade 2     diastolic dysfunction and a moderate to large pericardial effusion     with no significant right ventricular diastolic collapse suggesting     of early tamponade on the 2-D echocardiogram.  HOSPITAL COURSE:  Cheryl [name] is a pleasant 55 year old female who came in with complaints of pleuritic chest pain, positional in nature who was found to have pleuropericardial effusion as described above.  For this reason, she was seen  by Cardiology who started patient on a trial of steroids, colchicine, and naproxen.  She feels better with this intervention.  She also had a PPD which was negative.  The cause of the cause pericardial effusion not clear at this point.  Her workup also included ANA which was negative.  Sed rate 50, rheumatoid factor, negative D-dimer slightly elevated at 2.64.  Cardiac enzymes which were negative.  Antiphospholipid antibody unremarkable.  Mitochondrial antibodies also negative.  Hepatitis panel negative.  She had some transaminitis which continued to improve once patient was taken off statins.  She may not be tolerating statins well.  She has remained hemodynamically stable and should follow with Dr. Charlton Haws who graciously helped with the patient's management in this hospitalization. She will be on prednisone, naproxen, colchicine for 3 weeks until she gets followed up by Dr. Eden Emms.  In the interim, I set up an appointment with Dr. Benedetto Goad on June 4 at 3:20 p.m. particularly to have her kidney function checked due to the potential for nephrotoxicity with the medication combination she is being discharged on.  Otherwise, patient feels much better today.  She is discharged  in a stable condition.  Her labs include electrolytes showing sodium 137, potassium 4.3, BUN 18, creatinine 0.83.  LFTs showing total bilirubin 0.3, alkaline phosphatase 152, AST 23, ALT 93.  CBC showing white count 10.4, hemoglobin 10.1, hematocrit 30.7, platelet count 355.  Coagulation profile unremarkable. Time spent for discharge preparation less than 30 minutes.     Conley Canal, MD     SR/MEDQ  D:  06/30/2010  T:  06/30/2010  Job:  [id number]  cc:   Gloriajean Dell. Andrey Campanile, M.D. Noralyn Pick. Eden Emms, MD, [facility]  Electronically Signed by Conley Canal  on 07/01/2010 03:54:10 PM

## 2010-07-04 ENCOUNTER — Telehealth: Payer: Self-pay | Admitting: Cardiovascular Disease

## 2010-07-04 NOTE — Telephone Encounter (Signed)
Will ask Dr.Nishan on 5/30. Staff note fowarded to [facility] LPN because she will be working with him tomorrow.

## 2010-07-04 NOTE — Telephone Encounter (Signed)
Amy is the pt employer. Amy states she needs a letter stating what the patient is able to do and not do. Amy states the pt gave her a letter that she able to return to work but with restriction. Amy states they need details.  Amy fax# [phone]

## 2010-07-04 NOTE — Telephone Encounter (Signed)
Take naproxen with steroids and colchicine for pericarditis Naproxen q8 regularly not just as needed

## 2010-07-04 NOTE — Telephone Encounter (Signed)
Pt called because was unclear if she should take Naprosyn as needed or schedule it. Advised her if no Sx could take PRN. Pt still with some pain at night, advised her she should take before bedtime to prevent this. Pt understood.

## 2010-07-06 ENCOUNTER — Telehealth: Payer: Self-pay | Admitting: *Deleted

## 2010-07-06 NOTE — Telephone Encounter (Signed)
ATTEMPTED TO REACH PT RE NO RESTRICTIONS FOR JOB OTHER THAN NO MORE THAN 8 HOUR SHIFT AND MAY NOT LIFT MORE THAN 50 POUNDS. PER DR NISHAN./CY

## 2010-07-10 NOTE — Telephone Encounter (Signed)
Lmtcb./cy 

## 2010-07-12 NOTE — Telephone Encounter (Signed)
Spoke with pt, she is aware of dr Fabio Bering recommendations. She has FMLA papers that she will bring to the office Deliah Goody

## 2010-07-12 NOTE — Telephone Encounter (Signed)
pls rtn call to pt at [phone]

## 2010-07-14 NOTE — Consult Note (Signed)
NAME:  Cheryl [name], Cheryl [name]                ACCOUNT NO.:  [id number]  MEDICAL RECORD NO.:  [id number]           PATIENT TYPE:  O  LOCATION:  3702                         FACILITY:  [facility]  PHYSICIAN:  Jesse Sans. Shalaina Guardiola, MD, FACCDATE OF BIRTH:  December 22, 1955  DATE OF CONSULTATION:  06/27/2010 DATE OF DISCHARGE:                                CONSULTATION   PRIMARY CARDIOLOGIST:  Theron Arista C. Eden Emms, MD, [facility]  PRIMARY CARE PROVIDER:  Gloriajean Dell. Andrey Campanile, MD at [facility].  HISTORY OF PRESENT ILLNESS:  This is a 55 year old Caucasian female who was recently evaluated by Dr. Eden Emms for atypical chest pain.  At that time, the patient was complaining of pain worse with inspiration in the substernal area as well as between her shoulder blades and epigastric area.  The patient was scheduled for a stress Myoview at that time.  The patient completed the stress images approximately 4 days ago and was scheduled for her resting images today.  I have called the office and the patient's stress images showing ejection fraction of 60% with a small area of anterior apical thinning that is possibly breast attenuation, there is no full report as the patient's rest images are not completed.  The patient has continued to complain of the same pain, it has been persistent for over a month.  Again, it is still worse with inspiration as well as lying flat.  The patient does state now the pain has wrapped around her mid torso, it feels like a squeezing fullness sensation.  Over the past 3 days, she has had no appetite secondary to increased fullness.  She has also been complaining of chills intermittently over the past month with a fever, although she has not check this with the thermometer.  The patient has been complaining of increased nausea over the weekend as well as weakness.  With the increased pain, the patient came to the emergency department for further evaluation.  Of note, the patient's pain is not  exertional.  In the emergency department, labs were drawn showing an elevated D-dimer at 5.4.  Subsequent CT angio showed no evidence of pulmonary emboli, but there was a small-to-moderate pleural effusion that was not seen on echocardiogram on May 31, 2010.  The patient dif vomit while in the emergency department, but this was after contrast injection.  She has had no further episodes of vomiting.  She still mildly nauseous with intermittent pain in her shoulder and abdomen.  EKG shows diffuse T-wave flattening and low voltage, but no acute changes.  Of note, her LFTs are also elevated, she has currently been on a statin for almost a month at this time.  The patient was initially tachycardic upon arrival to the emergency department, but pulse is better controlled.  The patient was admitted by the [facility] and Cardiology was consulted for questionable chest pain and need for progressive imaging studies.  The patient has tried alternating Tylenol and ibuprofen for pain without relief.  She states that she will needs two certain positions that does make the pain ease, although does not resolve it completely.  PAST MEDICAL  HISTORY: 1. Graves disease, status post radioactive iodine in 1993.     a.     TSH, free T3/T4 are within normal limits last admission. 2. Hyperlipidemia, statin therapy initiated last admission. 3. Obesity. 4. Status post cholecystectomy. 5. Status post IUD.  SOCIAL HISTORY:  The patient lives in [place] by herself.  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  She has one son.  She is divorced.  She denies any tobacco or illicit drug use.  She occasionally uses alcohol.  FAMILY HISTORY:  Noncontributory for premature coronary artery disease. Her mother passed away from lung cancer.  Her father passed away from myocardial infarction in his late 5s.  She has two sisters, one with breast cancer and one with uterine cancer.  ALLERGIES:   No known drug allergies, also histamine-containing medications caused increased jitters.  HOME MEDICATIONS: 1. Synthroid 175 mg daily. 2. Pravachol 40 mg daily. 3. Tylenol 500 mg as needed. 4. Ibuprofen 200 mg as needed. 5. Aspirin 325 mg daily.  REVIEW OF SYSTEMS:  All pertinent positives and negatives as stated in HPI.  Other systems have been reviewed and are negative.  CODE STATUS:  Full.  PHYSICAL EXAMINATION:  VITAL SIGNS:  Temperature 98.8, pulse 85-113, respirations 18-20, blood pressure 117-142/57-91, O2 saturation 94% on room air. GENERAL:  This is a tearful middle-aged female.  She is in no acute distress. HEENT:  Normal. NECK:  Supple without bruit or JVD. HEART:  Regular rate and rhythm with S1 and S2.  There is a soft systolic murmur noted.  No rub.  1+ pedal pulses. LUNGS:  Clear to auscultation without wheezes, rales or rhonchi. ABDOMEN:  Soft, nontender, obese, positive bowel sounds x4. EXTREMITIES:  Trace bilateral lower extremity edema with no jugular venous insufficiency. MUSCULOSKELETAL:  No joint deformities or effusions. NEUROLOGIC:  Alert and oriented x3, cranial nerves II through XII grossly intact.  CT angiography of the chest, with no evidence of pulmonary embolus. Mild bibasilar atelectasis.  Small-to-moderate pericardial effusion.  CT of the abdomen and pelvis with contrast demonstrating no acute abnormalities within the abdomen or pelvis.  Very mild diverticulosis along the ascending colon.  No evidence of diverticulitis.  Intrauterine device noted in expected position.  Minimal degenerative changes at the lower lumbar spine.  Acute abdomen series showing no acute abnormalities of the chest and abdomen.  Stable cardiomegaly without failure.  Low lung volumes.  Leftward curvature at lumbar spine.  EKG showing sinus tachycardia at a rate of 100 beats per minute.  There is diffuse T-wave flattening as well as low voltage.  This is somewhat worse  than prior tracings in April.  Axis is normal.  Intervals are normal.  LABORATORY DATA:  WBC of 10.4, hemoglobin 10.1, hematocrit 30.7, platelets 355.  Sodium 138, potassium 3.5, chloride 100, bicarb 27, BUN 11, creatinine 0.67, alkaline phosphatase 165, AST 66, ALT 141, D-dimer 5.4.  Point-of-care markers are negative x1.  ASSESSMENT AND PLAN:  This is a 55 year old Caucasian female with history of Graves disease who presents with a chest pain that is pleuritic as well positional.  We felt the patient is having pericarditic pain without EKG changes besides low voltage.  The patient has no rub on exam.  CT angio of the chest did show mild-to-moderate pericardial effusion.  We will treat the patient empirically for pericarditis as well as her pain with nonsteroidal anti-inflammatory drugs.  Of note, pulsus paradoxus could not be evaluated, as there was not a  large cuff available on the floor.  Clinically the patient does not appear to have tamponade but we will repeat a 2-D echocardiogram to determine the size and strain of the pericardial effusion.  With the history of fevers and chills over the past 1-2 months and no complaints of upper respiratory infection or other infections, we will check a PPD to rule out tuberculosis as etiology.  The patient is with increased LFTs and question if this is infection versus hepatic congestion versus statin initiation.  The patient's statin will be discontinued.  The patient should be continued on Synthroid for her Graves disease.  Further treatment will be dependent upon the above. As far as the patient's recent stress test, she can have her resting images  completed at the office once her pericarditic pain has resolved.     Leonette Monarch, PA-C   ______________________________ Jesse Sans Daleen Squibb, MD, [facility]    NB/MEDQ  D:  06/27/2010  T:  06/28/2010  Job:  [id number]  cc:   Noralyn Pick. Eden Emms, MD, [facility] Gloriajean Dell. Andrey Campanile, M.D.  Electronically Signed  by Alen Blew P.A. on 06/28/2010 02:05:17 PM Electronically Signed by Valera Castle MD [facility] on 07/14/2010 01:37:09 PM

## 2010-07-21 ENCOUNTER — Other Ambulatory Visit (INDEPENDENT_AMBULATORY_CARE_PROVIDER_SITE_OTHER): Payer: Commercial Indemnity | Admitting: *Deleted

## 2010-07-21 ENCOUNTER — Encounter: Payer: Commercial Indemnity | Admitting: Cardiovascular Disease

## 2010-07-21 ENCOUNTER — Telehealth: Payer: Self-pay | Admitting: *Deleted

## 2010-07-21 DIAGNOSIS — R0989 Other specified symptoms and signs involving the circulatory and respiratory systems: Secondary | ICD-10-CM

## 2010-07-21 DIAGNOSIS — E079 Disorder of thyroid, unspecified: Secondary | ICD-10-CM

## 2010-07-21 DIAGNOSIS — R609 Edema, unspecified: Secondary | ICD-10-CM

## 2010-07-21 DIAGNOSIS — I319 Disease of pericardium, unspecified: Secondary | ICD-10-CM

## 2010-07-21 NOTE — Telephone Encounter (Signed)
PT CAME IN TODAY  FOR AN APPT WITH DR Eden Emms WAS NOT ON HIS SCHEDULE PER DR NISHAN PT NEEDS TO FINISH MYOVIEW AND F/U  AFTER REST OF TEST DONE .PT ALSO HAD QUESTION RE MEDS PREDNISONE NAPROXEN, AND COLCHICINE IF NEEDED TO CONT PER DR NISHAN PT MAY CONT NAPROXEN AND COLCHICINE  AND NEEDS SED RATE PENDING IF SED  RATE LOW IS TO WHETHER PT NEEDS TO CONT PREDNISONE PT AWARE  WILL HAVE SED RATE DRAWN TODAY AND MYOVIEW SCHEDULED .Zack Seal

## 2010-07-26 ENCOUNTER — Encounter (HOSPITAL_COMMUNITY): Payer: Commercial Indemnity | Admitting: Radiology

## 2010-07-31 ENCOUNTER — Telehealth: Payer: Self-pay | Admitting: Cardiovascular Disease

## 2010-07-31 NOTE — Telephone Encounter (Signed)
Pt has questions regarding medication if she should keep taking them or not

## 2010-07-31 NOTE — Telephone Encounter (Signed)
Spoke with pt, questions answered Cheryl [name]  

## 2010-08-03 ENCOUNTER — Ambulatory Visit (HOSPITAL_COMMUNITY): Payer: Commercial Indemnity | Attending: Cardiovascular Disease | Admitting: Radiology

## 2010-08-03 ENCOUNTER — Other Ambulatory Visit: Payer: Self-pay | Admitting: *Deleted

## 2010-08-03 ENCOUNTER — Other Ambulatory Visit (INDEPENDENT_AMBULATORY_CARE_PROVIDER_SITE_OTHER): Payer: Commercial Indemnity | Admitting: *Deleted

## 2010-08-03 DIAGNOSIS — I319 Disease of pericardium, unspecified: Secondary | ICD-10-CM

## 2010-08-03 DIAGNOSIS — R079 Chest pain, unspecified: Secondary | ICD-10-CM | POA: Insufficient documentation

## 2010-08-03 DIAGNOSIS — R0989 Other specified symptoms and signs involving the circulatory and respiratory systems: Secondary | ICD-10-CM

## 2010-08-03 MED ORDER — TECHNETIUM TC 99M TETROFOSMIN IV KIT
33.0000 | PACK | Freq: Once | INTRAVENOUS | Status: AC | PRN
  Administered 2010-08-03: 33 via INTRAVENOUS

## 2010-08-08 ENCOUNTER — Telehealth: Payer: Self-pay | Admitting: Cardiovascular Disease

## 2010-08-08 NOTE — Telephone Encounter (Signed)
Returning call back to [place].

## 2010-08-08 NOTE — Telephone Encounter (Signed)
Left message for pt of normal lab results Cheryl [name]

## 2010-08-18 ENCOUNTER — Telehealth: Payer: Self-pay | Admitting: Cardiovascular Disease

## 2010-08-18 NOTE — Telephone Encounter (Signed)
Per pt call, please call anytime after 3pm. Pt calling asking about results from stress test. Pt said stress test was over 2 weeks ago. Please call pt to report test results.

## 2010-08-21 ENCOUNTER — Telehealth: Payer: Self-pay | Admitting: Cardiovascular Disease

## 2010-08-21 NOTE — Telephone Encounter (Signed)
Pt returning call to Debra from Friday. Regarding stress test results. Please return pt call to advise.

## 2010-08-22 NOTE — Telephone Encounter (Signed)
See other phone note Jatinder Mcdonagh  

## 2010-08-22 NOTE — Telephone Encounter (Signed)
Spoke with pt, she is aware of the stress test results. She is still having some discomfort in her chest and back. She has several questions and conerns. Follow up appt made with dr Eden Emms Deliah Goody

## 2010-08-22 NOTE — Telephone Encounter (Signed)
Test results

## 2010-08-24 NOTE — H&P (Signed)
NAME:  Cheryl [name], Cheryl [name]                ACCOUNT NO.:  [id number]  MEDICAL RECORD NO.:  [id number]           PATIENT TYPE:  E  LOCATION:  [facility]                         FACILITY:  [facility]  PHYSICIAN:  Calvert Cantor, M.D.     DATE OF BIRTH:  [birth year]  DATE OF ADMISSION:  06/26/2010 DATE OF DISCHARGE:                             HISTORY & PHYSICAL   PRIMARY CARE PHYSICIAN:  Gloriajean Dell. Andrey Campanile, MD at [facility].  CHIEF COMPLAINT:  Chest pain.  HISTORY OF PRESENT ILLNESS:  This is a 55 year old female with a history of hyperlipidemia, Graves disease status post radioiodine ablation, and obesity.  The patient came in for chest pain on May 31, 2010.  She was discharged on June 01, 2010 after being ruled out for an MI.  She was set up with [facility] as an outpatient.  She had part one of the stress test done, part 2 of the stress test is supposed to be done tomorrow.  The patient states that the pain restarted about 3 days ago.  At present she describes it like a band around her chest traveling from her back to under her breasts resulting in tightness and has been coming and going. It has caused some shortness of breath.  In addition to the pain, she felt her abdomen was quite distended and tight.  Due to this fullness, she states that she did not eat at all yesterday.  Last night she was asked to drink contrast for a CAT scan and after drinking this contrast, she vomited it all up.  Currently she is not feeling any abdominal distention or abdominal discomfort. Currently she is not having any chest pain either.  She also describes pain in the back of her neck resulting in headaches. Pain is also present in her upper and mid back.  She has been taking ibuprofen and Tylenol for this pain without much relief.  In the ER, her D-dimer was noted to be elevated.  She had a CT of her chest done which is negative for a PE but does reveal a small to moderate pericardial  effusion.  Her LFTs are also noted to be elevated.  She has had a CT of her abdomen and pelvis with contrast performed in the ER which does not reveal any cause for the vomiting or the pain.  PAST MEDICAL HISTORY: 1. Hyperlipidemia. 2. Graves disease status post radioiodine ablation.  She is now on     Synthroid. 3. Morbid obesity.  PAST SURGICAL HISTORY: 1. Cholecystectomy. 2. She has an IUD.  SOCIAL HISTORY:  She has never smoked.  She drinks occasionally.  She is divorced.  She has 1 child.  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  ALLERGIES:  HISTAMINE medications give her the jitters.  HOME MEDICATIONS:  Per med rec. 1. Synthroid 175 mcg daily. 2. Pravachol 40 mg daily. 3. Aspirin 325 mg daily. 4. Acetaminophen as needed for muscular pain. 5. Ibuprofen as needed for muscular pain.  FAMILY HISTORY:  One sister with breast cancer.  Another sister with uterine cancer.  Mother died of lung cancer.  She was a smoker.  Father had a massive heart attack and died in his late 30s.  REVIEW OF SYSTEMS:  CONSTITUTIONAL:  She thinks she has lost a few pounds over this past month.  Positive for fatigue, positive for feeling hot and cold.  HEENT: She complains of headaches, mostly radiating up from her neck.  Most of her neck pain is left-sided.  No visual disturbances.  She does not have a sore throat but does have sinus trouble.  No earache or fullness in her ears.  RESPIRATORY:  She has been coughing up clear thick mucus.  She does have shortness of breath when she walks.  CARDIAC:  Chest pain as described in H and P.  No palpitations.  Complains of pedal edema.  GI:  Occasionally has heartburn, otherwise does not usually have any nausea, vomiting, abdominal pain, or diarrhea.  GU: No dysuria or hematuria.  She states urine has been very dark but she also has not been eating or drinking much for about 48 hours.  HEMATOLOGICAL: No easy bruising.  SKIN:  No rash.   MUSCULOSKELETAL:  Has joint pain in her ankles and knees and some back pain and neck pain as described in H&P.  NEUROLOGICAL:  She has never had a stroke or seizure.  No focal numbness or weakness. PSYCHOLOGICAL:  No anxiety or depression.  PHYSICAL EXAMINATION:  GENERAL:  Middle-aged female sitting up in bed in no acute distress. VITAL SIGNS:  Blood pressure 142/83, pulse 113, respiratory rate 20, temperature 98.8, oxygen saturation is 98%. HEENT:  Pupils equal, round, reactive to light.  Extraocular movements are intact.  Conjunctiva pink.  No scleral icterus.  Oral mucosa moist. NECK:  Supple.  No thyromegaly or lymphadenopathy. HEART:  Regular rate and rhythm.  No murmurs, rubs, or gallops. LUNGS:  Clear bilaterally.  Good respiratory effort.  No use of accessory muscles. ABDOMEN:  Obese, soft, nontender, nondistended.  Bowel sounds positive. Unable to assess for organomegaly. EXTREMITIES:  No cyanosis, clubbing, or edema.  Pedal pulses positive. NEUROLOGICAL:  Cranial nerves II-XII intact.  Able to move all 4 extremities. PSYCHOLOGICAL:  Awake, alert, oriented x3.  Mood and affect normal. SKIN:  Warm, dry.  No rash or bruising.  BLOOD WORK:  CBC reveals a hemoglobin of 10.1, hematocrit of 30.7.  First set of cardiac markers are negative.  LFTs reveal alk phos which is elevated at 165, AST 66, ALT 141.  Lipase is normal at 17.  D dimer a is elevated at 5.40.  Metabolic panel reveals a glucose which is mildly elevated at 106.  CT scan of the chest with contrast reveals mild basilar linear atelectasis and small to moderate pericardial effusion.  CT of the abdomen and pelvis with contrast reveals very mild diverticulosis along the ascending colon.  Intrauterine device noted in the expected position at the fundus of the uterus.  Minimal degenerative changes in lower lumbar spine.  X-ray of the abdomen acute with chest x-ray reveals stable cardiomegaly without failure to low  lung volumes and left fourth curvature of the lumbar spine.  EKG reveals sinus tachycardia at 100 beats per minute.  There is some T- wave flattening and mild inversion in V2, V3, V4, V5, and V6 and aVF.  ASSESSMENT/PLAN: 1. Chest pain.  I will request [facility] to see if we can     complete the rest of her stress test here.  In addition, I will ask     them to comment on  her pericardial effusion.  At this time her pain     is resolved.  We will go ahead and check 3 sets of cardiac enzymes     as well. 2. Abdominal distention.  Pain may be coming from the abdominal     etiology.  I will start her on proton pump inhibitor.  She has had     a cholecystectomy already. 3. Muscular neck and back pain.  She has tried Tylenol and ibuprofen     without much relief.  I will order some Vicodin for her. 4. Elevated LFTs.  We will follow up on these.  As mentioned above,     she has had a cholecystectomy. 5. Hyperlipidemia.  Continue pravastatin. 6. Hypothyroidism.  Continue Synthroid. 7. I am not sure why she takes a full-dose aspirin every day but for     now I will continue this as well. 8. Dehydration.  The patient states she has not been eating or     drinking much for a couple of days.  We will give her slow IV     fluids at 75 mL an hour for 1 liter. 9. Vomiting after contrast.  She can have Zofran p.r.n. if she     continues to have nausea and vomiting.  Time on admission was 50 minutes.     Calvert Cantor, M.D.     SR/MEDQ  D:  06/27/2010  T:  06/27/2010  Job:  [id number]  cc:   Gloriajean Dell. Andrey Campanile, M.D.  Electronically Signed by Calvert Cantor M.D. on 08/24/2010 12:03:51 PM

## 2010-09-08 ENCOUNTER — Encounter: Payer: Self-pay | Admitting: Cardiovascular Disease

## 2010-09-08 ENCOUNTER — Ambulatory Visit (INDEPENDENT_AMBULATORY_CARE_PROVIDER_SITE_OTHER): Payer: Commercial Indemnity | Admitting: Cardiovascular Disease

## 2010-09-08 VITALS — BP 158/84 | HR 90 | Ht 67.0 in | Wt 286.4 lb

## 2010-09-08 DIAGNOSIS — R609 Edema, unspecified: Secondary | ICD-10-CM

## 2010-09-08 DIAGNOSIS — R079 Chest pain, unspecified: Secondary | ICD-10-CM

## 2010-09-08 DIAGNOSIS — I319 Disease of pericardium, unspecified: Secondary | ICD-10-CM

## 2010-09-08 NOTE — Assessment & Plan Note (Signed)
Stable dependant PRN diuretic.  Declines duplex and vein specialist referral

## 2010-09-08 NOTE — Patient Instructions (Signed)
Your physician recommends that you schedule a follow-up appointment in: AS NEEDED  Your physician recommends that you continue on your current medications as directed. Please refer to the Current Medication list given to you today.  Your physician has requested that you have an echocardiogram. Echocardiography is a painless test that uses sound waves to create images of your heart. It provides your doctor with information about the size and shape of your heart and how well your heart's chambers and valves are working. This procedure takes approximately one hour. There are no restrictions for this procedure. PT'S CONVENIENCE DX PERCARDITIS

## 2010-09-08 NOTE — Progress Notes (Signed)
Referred post hospital for SSCP 4/24 Atypical pain R/O Echo with AV sclerosis. Pain worse with inspiration. Both shoulders and epigastric area. Only taking ASA not NSAI's. ECG in hospital with nonspecific ST/T wave changes. Pain persists post D/C but not worse. Makes it hard for her to sleep at night as pain can be positional. No dyspnea, palpitations, edema or syncope. Anxiety as two of her sisters were recently diagnosed with cancer ( breast and uterine) Rehospitalzed 5/12 and seen by TW.  CT with moderate pericardial effusion.  Rx ASA and NSAI's.  Myovue in our office normal Reviewed hospital D/C summary, echo, ECG and labs. TSH was 2.4 and has outpt F/U for her thyroid disease with Dr Sharl Ma. Has LE edema and venous insuficiency. Offerred to work this up further but she declined due to cost. Encouraged her to F/U with Dr Andrey Campanile and consider a diuretic for LE edema.  Had CT 5/22 and had pericardial effusion.  Reviewed Myovue 06/22/10  Normal no ishemia EF 57% Reviewed echo: 06/28/10 moderate effusion no tamponade  ROS: Denies fever, malais, weight loss, blurry vision, decreased visual acuity, cough, sputum, SOB, hemoptysis, pleuritic pain, palpitaitons, heartburn, abdominal pain, melena, lower extremity edema, claudication, or rash.  All other systems reviewed and negative  General: Affect appropriate Healthy:  appears stated age HEENT: normal Neck supple with no adenopathy JVP normal no bruits no thyromegaly Lungs clear with no wheezing and good diaphragmatic motion Heart:  S1/S2 no murmur,rub, gallop or click PMI normal Abdomen: benighn, BS positve, no tenderness, no AAA no bruit.  No HSM or HJR Distal pulses intact with no bruits No edema Neuro non-focal Skin warm and dry No muscular weakness   Current Outpatient Prescriptions  Medication Sig Dispense Refill  . acetaminophen (TYLENOL) 500 MG tablet Take 500 mg by mouth every 6 (six) hours as needed.        Marland Kitchen ibuprofen (ADVIL,MOTRIN)  200 MG tablet Take 200 mg by mouth every 6 (six) hours as needed.        Marland Kitchen levothyroxine (SYNTHROID, LEVOTHROID) 175 MCG tablet Take 175 mcg by mouth daily.          Allergies  Review of patient's allergies indicates no known allergies.  Electrocardiogram:  Assessment and Plan

## 2010-09-08 NOTE — Assessment & Plan Note (Signed)
Likely pericardial. Had course of steroids for 3 weeks.  No just asa.  F/U echo.  If effusion gone no other Rx.  If still present resume 3 week course of naproxen and colchicine

## 2010-09-14 ENCOUNTER — Ambulatory Visit (HOSPITAL_COMMUNITY): Payer: Commercial Indemnity | Attending: Cardiovascular Disease | Admitting: Radiology

## 2010-09-14 DIAGNOSIS — E669 Obesity, unspecified: Secondary | ICD-10-CM | POA: Insufficient documentation

## 2010-09-14 DIAGNOSIS — R609 Edema, unspecified: Secondary | ICD-10-CM | POA: Insufficient documentation

## 2010-09-14 DIAGNOSIS — I319 Disease of pericardium, unspecified: Secondary | ICD-10-CM | POA: Insufficient documentation

## 2010-09-14 DIAGNOSIS — I079 Rheumatic tricuspid valve disease, unspecified: Secondary | ICD-10-CM | POA: Insufficient documentation

## 2010-09-14 DIAGNOSIS — R079 Chest pain, unspecified: Secondary | ICD-10-CM | POA: Insufficient documentation

## 2010-09-14 DIAGNOSIS — I872 Venous insufficiency (chronic) (peripheral): Secondary | ICD-10-CM | POA: Insufficient documentation

## 2010-09-14 DIAGNOSIS — E0500 Thyrotoxicosis with diffuse goiter without thyrotoxic crisis or storm: Secondary | ICD-10-CM | POA: Insufficient documentation

## 2010-09-19 ENCOUNTER — Telehealth: Payer: Self-pay | Admitting: Cardiovascular Disease

## 2010-09-19 NOTE — Telephone Encounter (Signed)
Spoke with pt, aware of echo results. Follow up appt made per pt request Cheryl [name]

## 2010-09-19 NOTE — Telephone Encounter (Signed)
PT CALLING FOR ECHO RESULTS

## 2010-10-05 ENCOUNTER — Encounter: Payer: Self-pay | Admitting: Cardiovascular Disease

## 2010-10-05 ENCOUNTER — Encounter: Payer: Self-pay | Admitting: *Deleted

## 2010-10-05 ENCOUNTER — Ambulatory Visit (INDEPENDENT_AMBULATORY_CARE_PROVIDER_SITE_OTHER): Payer: Commercial Indemnity | Admitting: Cardiovascular Disease

## 2010-10-05 DIAGNOSIS — R079 Chest pain, unspecified: Secondary | ICD-10-CM

## 2010-10-05 DIAGNOSIS — I319 Disease of pericardium, unspecified: Secondary | ICD-10-CM

## 2010-10-05 HISTORY — DX: Disease of pericardium, unspecified: I31.9

## 2010-10-05 NOTE — Assessment & Plan Note (Signed)
Pleuritic pain gone.  Echo shows total resolution of pericardial effusion.  Continue ASA  F/U Dr Andrey Campanile

## 2010-10-05 NOTE — Progress Notes (Signed)
Referred post hospital for SSCP 4/24 Atypical pain R/O Echo with AV sclerosis. Pain worse with inspiration. Both shoulders and epigastric area. Only taking ASA not NSAI's. ECG in hospital with nonspecific ST/T wave changes. Pain persists post D/C but not worse. Makes it hard for her to sleep at night as pain can be positional. No dyspnea, palpitations, edema or syncope. Anxiety as two of her sisters were recently diagnosed with cancer ( breast and uterine) Rehospitalzed 5/12 and seen by TW. CT with moderate pericardial effusion. Rx ASA and NSAI's. Myovue in our office normal Reviewed hospital D/C summary, echo, ECG and labs. TSH was 2.4 and has outpt F/U for her thyroid disease with Dr Sharl Ma. Has LE edema and venous insuficiency. Offerred to work this up further but she declined due to cost. Encouraged her to F/U with Dr Andrey Campanile and consider a diuretic for LE edema.   Had CT 5/22 and had pericardial effusion. Reviewed Myovue 06/22/10 Normal no ishemia EF 57% Reviewed echo: 06/28/10 moderate effusion no tamponade  Reviewed echo 09/14/10 effusion resolved normal LV  ROS: Denies fever, malais, weight loss, blurry vision, decreased visual acuity, cough, sputum, SOB, hemoptysis, pleuritic pain, palpitaitons, heartburn, abdominal pain, melena, lower extremity edema, claudication, or rash.  All other systems reviewed and negative  General: Affect appropriate Healthy:  appears stated age HEENT: normal Neck supple with no adenopathy JVP normal no bruits no thyromegaly Lungs clear with no wheezing and good diaphragmatic motion Heart:  S1/S2 no murmur,rub, gallop or click PMI normal Abdomen: benighn, BS positve, no tenderness, no AAA no bruit.  No HSM or HJR Distal pulses intact with no bruits No edema Neuro non-focal Skin warm and dry No muscular weakness   Current Outpatient Prescriptions  Medication Sig Dispense Refill  . acetaminophen (TYLENOL) 500 MG tablet Take 500 mg by mouth every 6 (six) hours  as needed.        Marland Kitchen ibuprofen (ADVIL,MOTRIN) 200 MG tablet Take 200 mg by mouth every 6 (six) hours as needed.        Marland Kitchen levothyroxine (SYNTHROID, LEVOTHROID) 175 MCG tablet Take 175 mcg by mouth daily.          Allergies  Review of patient's allergies indicates no known allergies.  Electrocardiogram:  Assessment and Plan

## 2010-10-05 NOTE — Assessment & Plan Note (Signed)
Normal myovue no evidence of CAD

## 2011-04-17 ENCOUNTER — Other Ambulatory Visit: Payer: Self-pay | Admitting: Gynecology

## 2011-04-17 DIAGNOSIS — Z1231 Encounter for screening mammogram for malignant neoplasm of breast: Secondary | ICD-10-CM

## 2011-04-30 ENCOUNTER — Ambulatory Visit: Payer: Commercial Indemnity

## 2011-05-01 ENCOUNTER — Ambulatory Visit
Admission: RE | Admit: 2011-05-01 | Discharge: 2011-05-01 | Disposition: A | Discharge status: Home / Self Care | Payer: Commercial Indemnity | Source: Ambulatory Visit | Attending: Gynecology | Admitting: Gynecology

## 2011-05-01 DIAGNOSIS — Z1231 Encounter for screening mammogram for malignant neoplasm of breast: Secondary | ICD-10-CM

## 2012-05-07 ENCOUNTER — Other Ambulatory Visit: Payer: Self-pay

## 2012-05-07 DIAGNOSIS — Z803 Family history of malignant neoplasm of breast: Secondary | ICD-10-CM

## 2012-05-07 DIAGNOSIS — Z1231 Encounter for screening mammogram for malignant neoplasm of breast: Secondary | ICD-10-CM

## 2012-06-03 ENCOUNTER — Ambulatory Visit
Admission: RE | Admit: 2012-06-03 | Discharge: 2012-06-03 | Disposition: A | Discharge status: Home / Self Care | Payer: Commercial Indemnity | Source: Ambulatory Visit

## 2012-06-03 DIAGNOSIS — Z803 Family history of malignant neoplasm of breast: Secondary | ICD-10-CM

## 2012-06-03 DIAGNOSIS — Z1231 Encounter for screening mammogram for malignant neoplasm of breast: Secondary | ICD-10-CM

## 2012-06-04 ENCOUNTER — Other Ambulatory Visit: Payer: Self-pay | Admitting: Gynecology

## 2012-06-04 DIAGNOSIS — R928 Other abnormal and inconclusive findings on diagnostic imaging of breast: Secondary | ICD-10-CM

## 2012-06-17 ENCOUNTER — Ambulatory Visit
Admission: RE | Admit: 2012-06-17 | Discharge: 2012-06-17 | Disposition: A | Discharge status: Home / Self Care | Payer: Commercial Indemnity | Source: Ambulatory Visit | Attending: Gynecology | Admitting: Gynecology

## 2012-06-17 ENCOUNTER — Other Ambulatory Visit: Payer: Self-pay | Admitting: Gynecology

## 2012-06-17 DIAGNOSIS — R928 Other abnormal and inconclusive findings on diagnostic imaging of breast: Secondary | ICD-10-CM

## 2012-06-17 DIAGNOSIS — N6320 Unspecified lump in the left breast, unspecified quadrant: Secondary | ICD-10-CM

## 2012-06-23 ENCOUNTER — Other Ambulatory Visit: Payer: Self-pay | Admitting: Gynecology

## 2012-06-23 ENCOUNTER — Ambulatory Visit
Admission: RE | Admit: 2012-06-23 | Discharge: 2012-06-23 | Disposition: A | Discharge status: Home / Self Care | Payer: Commercial Indemnity | Source: Ambulatory Visit | Attending: Gynecology | Admitting: Gynecology

## 2012-06-23 DIAGNOSIS — N6320 Unspecified lump in the left breast, unspecified quadrant: Secondary | ICD-10-CM

## 2012-07-07 ENCOUNTER — Other Ambulatory Visit: Payer: Self-pay | Admitting: *Deleted

## 2012-07-07 DIAGNOSIS — R600 Localized edema: Secondary | ICD-10-CM

## 2012-09-01 ENCOUNTER — Encounter: Payer: Self-pay | Admitting: Vascular Surgery

## 2012-09-02 ENCOUNTER — Ambulatory Visit (INDEPENDENT_AMBULATORY_CARE_PROVIDER_SITE_OTHER): Payer: Commercial Indemnity | Admitting: Vascular Surgery

## 2012-09-02 ENCOUNTER — Encounter: Payer: Self-pay | Admitting: Vascular Surgery

## 2012-09-02 ENCOUNTER — Encounter (INDEPENDENT_AMBULATORY_CARE_PROVIDER_SITE_OTHER): Payer: Commercial Indemnity | Admitting: *Deleted

## 2012-09-02 VITALS — BP 143/83 | HR 65 | Resp 20 | Ht 67.0 in | Wt 262.0 lb

## 2012-09-02 DIAGNOSIS — R609 Edema, unspecified: Secondary | ICD-10-CM

## 2012-09-02 DIAGNOSIS — R600 Localized edema: Secondary | ICD-10-CM

## 2012-09-02 DIAGNOSIS — I83893 Varicose veins of bilateral lower extremities with other complications: Secondary | ICD-10-CM

## 2012-09-02 HISTORY — DX: Localized edema: R60.0

## 2012-09-02 HISTORY — DX: Varicose veins of bilateral lower extremities with other complications: I83.893

## 2012-09-02 NOTE — Progress Notes (Signed)
Subjective:     Patient ID: Cheryl [name], female   DOB: [birth year], 56 y.o.   MRN: [id number]  HPI this 57 year old female is referred for swelling, pain, and darkening of the skin in both lower extremities. She states that this has been progressing over the last few years. She denies a history of DVT, thrombophlebitis, or previous stasis ulcers or bleeding. She has noticed the skin being quite thick and shiny over the past several months. She does not wear elastic compression stockings nor elevate her legs or regular basis. She does have aching throbbing and burning discomfort as the day progresses.  Past Medical History  Diagnosis Date  . Chest pain   . Grave's disease   . HTN (hypertension)   . HLD (hyperlipidemia)   . Obesity   . Chronic dermatitis   . Edema of both legs     History  Substance Use Topics  . Smoking status: Never Smoker   . Smokeless tobacco: Never Used  . Alcohol Use: No    Family History  Problem Relation Age of Onset  . Coronary artery disease    . Cancer Mother     lung  . Other Mother     varicose veins  . Heart disease Father   . Hyperlipidemia Father   . Hypertension Father   . Other Father     pvd  . Cancer Sister     No Known Allergies  Current outpatient prescriptions:acetaminophen (TYLENOL) 500 MG tablet, Take 500 mg by mouth every 6 (six) hours as needed.  , Disp: , Rfl: ;  ibuprofen (ADVIL,MOTRIN) 200 MG tablet, Take 200 mg by mouth every 6 (six) hours as needed.  , Disp: , Rfl: ;  levothyroxine (SYNTHROID, LEVOTHROID) 175 MCG tablet, Take 0.15 mcg by mouth daily. , Disp: , Rfl:   BP 143/83  Pulse 65  Resp 20  Ht 5\' 7"  (1.702 m)  Wt 262 lb (118.842 kg)  BMI 41.03 kg/m2  Body mass index is 41.03 kg/(m^2).           Review of Systems denies chest pain or dyspnea on exertion or hemoptysis. Does complain primarily of swelling in her legs with no other specific complaints and a complete review of systems    Objective:   Physical Exam blood pressure 143/83 heart rate 65 respirations 20 Gen.-alert and oriented x3 in no apparent distress HEENT normal for age Lungs no rhonchi or wheezing Cardiovascular regular rhythm no murmurs carotid pulses 3+ palpable no bruits audible Abdomen soft nontender no palpable masses Musculoskeletal free of  major deformities Skin clear -no rashes Neurologic normal Lower extremities 3+ femoral and dorsalis pedis pulses palpable bilaterally with 1+ edema bilaterally Both lower extremities have symmetrical severe hyperpigmentation and thickening of the skin with lipoma dermatosclerosis but no active ulceration. Multiple reticular veins around both ankle areas. No large bulging varicosities noted.  Today I ordered bilateral venous duplex exam which I reviewed and interpreted. Patient has gross reflux throughout both great saphenous systems from the mid calf to the saphenofemoral junction with no DVT and no reflux in the small saphenous systems      Assessment:     Severe venous insufficiency bilaterally due to gross reflux bilateral great saphenous systems with severe skin changes but no active ulcers    Plan:     #1 long-leg elastic compression stockings 20-30 mm gradient #2 elevate legs as much as possible during the day #3 ibuprofen on a daily basis #4 patient  to return in 3 months. If no dramatic improvement in skin quality she will need laser ablation of bilateral great saphenous veins.

## 2012-11-11 ENCOUNTER — Telehealth: Payer: Self-pay | Admitting: *Deleted

## 2012-11-11 NOTE — Telephone Encounter (Signed)
Returned patient's call. He leg pain has worsened esp behind her knees. I suggested continued use of the compression stockings , Ibuprofen, and heat. She is to return to [place] in a feww weeks. We will then precert her for laser ablation.

## 2012-12-08 ENCOUNTER — Encounter: Payer: Self-pay | Admitting: Vascular Surgery

## 2012-12-09 ENCOUNTER — Encounter: Payer: Self-pay | Admitting: Vascular Surgery

## 2012-12-09 ENCOUNTER — Ambulatory Visit (INDEPENDENT_AMBULATORY_CARE_PROVIDER_SITE_OTHER): Payer: Commercial Indemnity | Admitting: Vascular Surgery

## 2012-12-09 VITALS — BP 152/84 | HR 72 | Resp 16 | Ht 67.0 in | Wt 262.0 lb

## 2012-12-09 DIAGNOSIS — I83893 Varicose veins of bilateral lower extremities with other complications: Secondary | ICD-10-CM

## 2012-12-09 NOTE — Progress Notes (Signed)
Subjective:     Patient ID: Cheryl [name], female   DOB: 1955/05/28, 57 y.o.   MRN: [id number]  HPI this 57 year old female returns for continued followup regarding her severe venous insufficiency of both lower extremities. She has severe skin changes with hyperpigmentation and lipopdermatosclerosis of both lower extremities but has no history of active ulcerations. She has pain and swelling despite trying use long-leg elastic compression stockings 20-30 mm gradient as well as trying elevation and ibuprofen. She continues to have pain and has made no improvement over the past 3 months with conservative measures.  Past Medical History  Diagnosis Date  . Chest pain   . Grave's disease   . HTN (hypertension)   . HLD (hyperlipidemia)   . Obesity   . Chronic dermatitis   . Edema of both legs     History  Substance Use Topics  . Smoking status: Never Smoker   . Smokeless tobacco: Never Used  . Alcohol Use: No    Family History  Problem Relation Age of Onset  . Coronary artery disease    . Cancer Mother     lung  . Other Mother     varicose veins  . Heart disease Father   . Hyperlipidemia Father   . Hypertension Father   . Other Father     pvd  . Cancer Sister     No Known Allergies  Current outpatient prescriptions:acetaminophen (TYLENOL) 500 MG tablet, Take 500 mg by mouth every 6 (six) hours as needed.  , Disp: , Rfl: ;  ibuprofen (ADVIL,MOTRIN) 200 MG tablet, Take 200 mg by mouth every 6 (six) hours as needed.  , Disp: , Rfl: ;  levothyroxine (SYNTHROID, LEVOTHROID) 175 MCG tablet, Take 0.15 mcg by mouth daily. Take 0.15 mcg per day, Disp: , Rfl:   BP 152/84  Pulse 72  Resp 16  Ht 5\' 7"  (1.702 m)  Wt 262 lb (118.842 kg)  BMI 41.03 kg/m2  Body mass index is 41.03 kg/(m^2).           Review of Systems denies chest pain, dyspnea on exertion, PND, orthopnea, hemoptysis.    Objective:   Physical Exam BP 152/84  Pulse 72  Resp 16  Ht 5\' 7"  (1.702 m)  Wt 262  lb (118.842 kg)  BMI 41.03 kg/m2  General well-developed well-nourished female no apparent stress alert and oriented x3 Lungs no rhonchi or wheezing Bilateral lower extremities with severe hyperpigmentation and thinning of skin with scaly rash lower third and some reticular veins distally 1+ edema and tightness of skin.  Patient has documented gross reflux in both great saphenous systems on formal venous duplex performed at last visit but no DVT     Assessment:     Severe bilateral gross reflux great saphenous veins with severe skin changes lower third bilateral lower extremities with pain and swelling not responding to conservative measures    Plan:     Patient needs #1 laser ablation right great saphenous vein to be followed by #2 laser ablation left great saphenous vein. Will proceed with precertification to perform this in the near future to hopefully stabilize and improve the skin quality and edema and pain and this nicely

## 2012-12-17 ENCOUNTER — Other Ambulatory Visit: Payer: Self-pay | Admitting: *Deleted

## 2012-12-17 DIAGNOSIS — I83893 Varicose veins of bilateral lower extremities with other complications: Secondary | ICD-10-CM

## 2012-12-26 ENCOUNTER — Encounter: Payer: Self-pay | Admitting: Vascular Surgery

## 2012-12-29 ENCOUNTER — Encounter: Payer: Self-pay | Admitting: Vascular Surgery

## 2012-12-29 ENCOUNTER — Ambulatory Visit (INDEPENDENT_AMBULATORY_CARE_PROVIDER_SITE_OTHER): Payer: Commercial Indemnity | Admitting: Vascular Surgery

## 2012-12-29 VITALS — BP 164/78 | HR 69 | Resp 18 | Ht 67.0 in | Wt 250.0 lb

## 2012-12-29 DIAGNOSIS — I83893 Varicose veins of bilateral lower extremities with other complications: Secondary | ICD-10-CM

## 2012-12-29 NOTE — Progress Notes (Signed)
   Laser Ablation Procedure      Date: 12/29/2012    Cheryl [name] DOB:October 03, 1955  Consent signed: Yes  Surgeon:J.D. Hart [place]  Procedure: Laser Ablation: right Greater Saphenous Vein  BP 164/78  Pulse 69  Resp 18  Ht 5\' 7"  (1.702 m)  Wt 250 lb (113.399 kg)  BMI 39.15 kg/m2  Start time: 9   End time: 9:50  Tumescent Anesthesia: 475 cc 0.9% NaCl with 50 cc Lidocaine HCL with 1% Epi and 15 cc 8.4% NaHCO3  Local Anesthesia: 4 cc Lidocaine HCL and NaHCO3 (ratio 2:1)  Pulsed mode: 15 watts, delay, 1.0 duration Total energy: 2478, total pulses: 167, total time: 2:46       Patient tolerated procedure well: Yes  Notes:   Description of Procedure:  After marking the course of the saphenous vein and the secondary varicosities in the standing position, the patient was placed on the operating table in the supine position, and the right leg was prepped and draped in sterile fashion. Local anesthetic was administered, and under ultrasound guidance the saphenous vein was accessed with a micro needle and guide wire; then the micro puncture sheath was placed. A guide wire was inserted to the saphenofemoral junction, followed by a 5 french sheath.  The position of the sheath and then the laser fiber below the junction was confirmed using the ultrasound and visualization of the aiming beam.  Tumescent anesthesia was administered along the course of the saphenous vein using ultrasound guidance. Protective laser glasses were placed on the patient, and the laser was fired at 15 watts pulsed mode.  For a total of 2478 joules.  A steri strip was applied to the puncture site.   ABD pads and thigh high compression stockings were applied.  Ace wrap bandages were applied over the phlebectomy sites and at the top of the saphenofemoral junction.  Blood loss was less than 15 cc.  The patient ambulated out of the operating room having tolerated the procedure well.

## 2012-12-29 NOTE — Progress Notes (Signed)
Subjective:     Patient ID: Cheryl [name], female   DOB: Sep 15, 1955, 57 y.o.   MRN: [id number]  HPI this 57 year old female had laser ablation of the right great saphenous vein performed under local tumescent anesthesia for venous hypertension with severe skin changes the lower third of the right leg. Total of 2470 J of energy was utilized. She tolerated suture well.  Review of Systems     Objective:   Physical Exam BP 164/78  Pulse 69  Resp 18  Ht 5\' 7"  (1.702 m)  Wt 250 lb (113.399 kg)  BMI 39.15 kg/m2       Assessment:     Well-tolerated laser ablation right great saphenous vein performed under local tumescent anesthesia for venous hypertension with severe skin changes    Plan:     Return in one week for venous duplex exam to confirm closure right great saphenous vein. Will then proceed with similar procedure on contralateral left leg

## 2012-12-30 ENCOUNTER — Encounter: Payer: Self-pay | Admitting: Vascular Surgery

## 2012-12-30 ENCOUNTER — Telehealth: Payer: Self-pay | Admitting: *Deleted

## 2012-12-30 NOTE — Telephone Encounter (Signed)
Patient doing well. Not having any pain. Following all instructions. Reminded her of her fu appt next week.

## 2013-01-05 ENCOUNTER — Encounter (HOSPITAL_COMMUNITY): Payer: Commercial Indemnity

## 2013-01-05 ENCOUNTER — Ambulatory Visit: Payer: Commercial Indemnity | Admitting: Vascular Surgery

## 2013-01-05 ENCOUNTER — Encounter: Payer: Self-pay | Admitting: Vascular Surgery

## 2013-01-06 ENCOUNTER — Encounter: Payer: Self-pay | Admitting: Vascular Surgery

## 2013-01-06 ENCOUNTER — Ambulatory Visit (HOSPITAL_COMMUNITY)
Admission: RE | Admit: 2013-01-06 | Discharge: 2013-01-06 | Disposition: A | Discharge status: Home / Self Care | Payer: Commercial Indemnity | Source: Ambulatory Visit | Attending: Vascular Surgery | Admitting: Vascular Surgery

## 2013-01-06 ENCOUNTER — Ambulatory Visit (INDEPENDENT_AMBULATORY_CARE_PROVIDER_SITE_OTHER): Payer: Commercial Indemnity | Admitting: Vascular Surgery

## 2013-01-06 VITALS — BP 153/85 | HR 66 | Resp 16 | Ht 67.0 in | Wt 250.0 lb

## 2013-01-06 DIAGNOSIS — I83893 Varicose veins of bilateral lower extremities with other complications: Secondary | ICD-10-CM

## 2013-01-06 NOTE — Progress Notes (Signed)
Subjective:     Patient ID: Cheryl [name], female   DOB: 07/09/1955, 57 y.o.   MRN: [id number]  HPI this 57 year old female is one week post laser ablation right great saphenous vein from proximal The saphenofemoral junction for severe venous insufficiency with severe skin changes and edema in the lower third of the right leg. She states the leg feels much better with decreased edema. She's had some mild-to-moderate discomfort along the course of the great saphenous vein from the knee to the groin area. She has worn elastic compression stocking in take and ibuprofen as instructed.  Past Medical History  Diagnosis Date  . Chest pain   . Grave's disease   . HTN (hypertension)   . HLD (hyperlipidemia)   . Obesity   . Chronic dermatitis   . Edema of both legs     History  Substance Use Topics  . Smoking status: Never Smoker   . Smokeless tobacco: Never Used  . Alcohol Use: No    Family History  Problem Relation Age of Onset  . Coronary artery disease    . Cancer Mother     lung  . Other Mother     varicose veins  . Heart disease Father   . Hyperlipidemia Father   . Hypertension Father   . Other Father     pvd  . Cancer Sister     No Known Allergies  Current outpatient prescriptions:acetaminophen (TYLENOL) 500 MG tablet, Take 500 mg by mouth every 6 (six) hours as needed.  , Disp: , Rfl: ;  ibuprofen (ADVIL,MOTRIN) 200 MG tablet, Take 200 mg by mouth every 6 (six) hours as needed.  , Disp: , Rfl: ;  levothyroxine (SYNTHROID, LEVOTHROID) 175 MCG tablet, Take 0.15 mcg by mouth daily. Take 0.15 mcg per day, Disp: , Rfl:   BP 153/85  Pulse 66  Resp 16  Ht 5\' 7"  (1.702 m)  Wt 250 lb (113.399 kg)  BMI 39.15 kg/m2  Body mass index is 39.15 kg/(m^2).          Review of Systems denies chest pain, dyspnea on exertion, PND, orthopnea, hemoptysis     Objective:   Physical Exam BP 153/85  Pulse 66  Resp 16  Ht 5\' 7"  (1.702 m)  Wt 250 lb (113.399 kg)  BMI 39.15  kg/m2  Gen. obese female in no apparent distress alert and oriented x3 Lungs no rhonchi or wheezing Right leg with mild discomfort along the course of great saphenous vein from proximal calf to saphenofemoral junction. Decreased edema distally with severe thickening and hyperpigmentation of the lower leg with no active ulcer. 3+ dorsalis pedis pulse palpable.  Today I ordered a venous duplex exam of the right leg which are reviewed and interpreted. There is no DVT. There is successful closure of the right great saphenous vein.      Assessment:     Successful laser ablation right great saphenous vein for venous hypertension with severe skin changes and edema right leg    Plan:     Return next week for same procedure on contralateral left leg

## 2013-01-09 ENCOUNTER — Encounter: Payer: Self-pay | Admitting: Vascular Surgery

## 2013-01-12 ENCOUNTER — Ambulatory Visit (INDEPENDENT_AMBULATORY_CARE_PROVIDER_SITE_OTHER): Payer: Commercial Indemnity | Admitting: Vascular Surgery

## 2013-01-12 ENCOUNTER — Encounter: Payer: Self-pay | Admitting: Vascular Surgery

## 2013-01-12 VITALS — BP 125/85 | HR 75 | Resp 16 | Ht 66.0 in | Wt 250.0 lb

## 2013-01-12 DIAGNOSIS — I83893 Varicose veins of bilateral lower extremities with other complications: Secondary | ICD-10-CM

## 2013-01-12 NOTE — Progress Notes (Signed)
   Laser Ablation Procedure      Date: 01/12/2013    Cheryl [name] DOB:04-08-1955  Consent signed: Yes  Surgeon:J.D. Hart [place]  Procedure: Laser Ablation: left Greater Saphenous Vein  BP 125/85  Pulse 75  Resp 16  Ht 5\' 6"  (1.676 m)  Wt 250 lb (113.399 kg)  BMI 40.37 kg/m2  Start time: 3:00   End time: 3:45  Tumescent Anesthesia: 425 cc 0.9% NaCl with 50 cc Lidocaine HCL with 1% Epi and 15 cc 8.4% NaHCO3  Local Anesthesia: 5 cc Lidocaine HCL and NaHCO3 (ratio 2:1)  Pulsed mode: 15 watts, 500 ms delay, 1.0 duration Total energy: 2215, total pulses: 148, total time: 2:28     Patient tolerated procedure well: Yes  Notes:   Description of Procedure:  After marking the course of the saphenous vein and the secondary varicosities in the standing position, the patient was placed on the operating table in the supine position, and the left leg was prepped and draped in sterile fashion. Local anesthetic was administered, and under ultrasound guidance the saphenous vein was accessed with a micro needle and guide wire; then the micro puncture sheath was placed. A guide wire was inserted to the saphenofemoral junction, followed by a 5 french sheath.  The position of the sheath and then the laser fiber below the junction was confirmed using the ultrasound and visualization of the aiming beam.  Tumescent anesthesia was administered along the course of the saphenous vein using ultrasound guidance. Protective laser glasses were placed on the patient, and the laser was fired at 15 watt pulsed mode advancing 1-2 mm per sec.  For a total of 2215 joules.  A steri strip was applied to the puncture site.    ABD pads and thigh high compression stockings were applied.  Ace wrap bandages were applied over the phlebectomy sites and at the top of the saphenofemoral junction.  Blood loss was less than 15 cc.  The patient ambulated out of the operating room having tolerated the procedure well.

## 2013-01-12 NOTE — Progress Notes (Signed)
Subjective:     Patient ID: EFFIE WAHLERT, female   DOB: [birth year], 57 y.o.   MRN: [id number]  HPI this 57 year old female laser ablation left great saphenous vein performed from the proximal calf to the saphenofemoral junction under local tumescent anesthesia. She tolerated the procedure well.   Review of Systems     Objective:   Physical Exam BP 125/85  Pulse 75  Resp 16  Ht 5\' 6"  (1.676 m)  Wt 250 lb (113.399 kg)  BMI 40.37 kg/m2        Assessment:     Well-tolerated laser ablation left great saphenous vein performed under local tumescent anesthesia for venous hypertension with pain and severe skin changes    Plan:     Return in one week for venous duplex exam to confirm closure left great saphenous

## 2013-01-13 ENCOUNTER — Telehealth: Payer: Self-pay | Admitting: *Deleted

## 2013-01-13 ENCOUNTER — Encounter: Payer: Self-pay | Admitting: Vascular Surgery

## 2013-01-13 NOTE — Telephone Encounter (Signed)
Pt doing well. No problems or concerns. Reminded her of her fu appt next week. She is following all instructions.

## 2013-01-19 ENCOUNTER — Encounter: Payer: Self-pay | Admitting: Vascular Surgery

## 2013-01-20 ENCOUNTER — Other Ambulatory Visit: Payer: Self-pay | Admitting: Vascular Surgery

## 2013-01-20 ENCOUNTER — Ambulatory Visit (INDEPENDENT_AMBULATORY_CARE_PROVIDER_SITE_OTHER): Payer: Commercial Indemnity | Admitting: Vascular Surgery

## 2013-01-20 ENCOUNTER — Encounter: Payer: Self-pay | Admitting: Vascular Surgery

## 2013-01-20 ENCOUNTER — Ambulatory Visit (HOSPITAL_COMMUNITY)
Admission: RE | Admit: 2013-01-20 | Discharge: 2013-01-20 | Disposition: A | Discharge status: Home / Self Care | Payer: Commercial Indemnity | Source: Ambulatory Visit | Attending: Vascular Surgery | Admitting: Vascular Surgery

## 2013-01-20 VITALS — BP 124/84 | HR 74 | Resp 16 | Ht 66.5 in | Wt 250.0 lb

## 2013-01-20 DIAGNOSIS — I83893 Varicose veins of bilateral lower extremities with other complications: Secondary | ICD-10-CM

## 2013-01-20 NOTE — Progress Notes (Signed)
Subjective:     Patient ID: Cheryl [name], female   DOB: 1955/07/23, 57 y.o.   MRN: [id number]  HPI this 57 year old female returns 1 week post laser ablation left great saphenous vein for venous hypertension with severe skin changes and chronic edema. She states that the skin or any looks better and that the swelling has definitely diminished since her procedure. She has had some mild-to-moderate discomfort in the medial thigh as one would expect.  Past Medical History  Diagnosis Date  . Chest pain   . Grave's disease   . HTN (hypertension)   . HLD (hyperlipidemia)   . Obesity   . Chronic dermatitis   . Edema of both legs   . Varicose veins     History  Substance Use Topics  . Smoking status: Never Smoker   . Smokeless tobacco: Never Used  . Alcohol Use: No    Family History  Problem Relation Age of Onset  . Coronary artery disease    . Cancer Mother     lung  . Other Mother     varicose veins  . Heart disease Father   . Hyperlipidemia Father   . Hypertension Father   . Other Father     pvd  . Cancer Sister     No Known Allergies  Current outpatient prescriptions:acetaminophen (TYLENOL) 500 MG tablet, Take 500 mg by mouth every 6 (six) hours as needed.  , Disp: , Rfl: ;  ibuprofen (ADVIL,MOTRIN) 200 MG tablet, Take 200 mg by mouth every 6 (six) hours as needed.  , Disp: , Rfl: ;  levothyroxine (SYNTHROID, LEVOTHROID) 175 MCG tablet, Take 0.15 mcg by mouth daily. Take 0.15 mcg per day, Disp: , Rfl:   BP 124/84  Pulse 74  Resp 16  Ht 5' 6.5" (1.689 m)  Wt 250 lb (113.399 kg)  BMI 39.75 kg/m2  Body mass index is 39.75 kg/(m^2).          Review of Systems denies chest pain, dyspnea on exertion, PND, orthopnea, hemoptysis, claudication.    Objective:   Physical Exam BP 124/84  Pulse 74  Resp 16  Ht 5' 6.5" (1.689 m)  Wt 250 lb (113.399 kg)  BMI 39.75 kg/m2  General well-developed well-nourished female no apparent stress alert and oriented x3 Lungs no  rhonchi or wheezing Left leg with mild discomfort to palpation mid thigh over the great saphenous vein. 3+ dorsalis pedis pulse palpable distally. Hyperpigmentation lower third of left leg with no active ulcers and decreased edema from last week.  Today I ordered venous duplex exam of the left leg which are reviewed and interpreted. There is no DVT. Left great saphenous vein is closed up to near the saphenofemoral junction.      Assessment:     Successful laser ablation left great saphenous vein venous hypertension with skin changes and chronic edema-already improved    Plan:     Return to see [place] on when necessary basis

## 2013-04-30 ENCOUNTER — Other Ambulatory Visit: Payer: Self-pay

## 2013-04-30 DIAGNOSIS — Z803 Family history of malignant neoplasm of breast: Secondary | ICD-10-CM

## 2013-04-30 DIAGNOSIS — Z1231 Encounter for screening mammogram for malignant neoplasm of breast: Secondary | ICD-10-CM

## 2013-06-04 ENCOUNTER — Ambulatory Visit
Admission: RE | Admit: 2013-06-04 | Discharge: 2013-06-04 | Disposition: A | Discharge status: Home / Self Care | Payer: Commercial Indemnity | Source: Ambulatory Visit

## 2013-06-04 DIAGNOSIS — Z803 Family history of malignant neoplasm of breast: Secondary | ICD-10-CM

## 2013-06-04 DIAGNOSIS — Z1231 Encounter for screening mammogram for malignant neoplasm of breast: Secondary | ICD-10-CM

## 2014-01-13 ENCOUNTER — Ambulatory Visit (INDEPENDENT_AMBULATORY_CARE_PROVIDER_SITE_OTHER): Payer: Commercial Indemnity | Admitting: *Deleted

## 2014-01-13 DIAGNOSIS — I83892 Varicose veins of left lower extremities with other complications: Secondary | ICD-10-CM

## 2014-01-13 NOTE — Progress Notes (Signed)
Pt came in to have me look at a small "bump" on her inner left lower calf. Had Dr, Edilia Boickson come in. He thinks its a perforator and told her not to worry about it. He said there is no need to treat it. He suggested wearing knee high compression since even after her laser procedure, her skin is still thickened and discolored. 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 Pt seemed reassured. We also discussed what to do if the raised spider vein on the outer side of her left leg were to burst. Gave her a 4" ace wrap. Will follow prn.

## 2014-05-11 ENCOUNTER — Other Ambulatory Visit: Payer: Self-pay

## 2014-05-11 DIAGNOSIS — Z1231 Encounter for screening mammogram for malignant neoplasm of breast: Secondary | ICD-10-CM

## 2014-06-07 ENCOUNTER — Encounter (INDEPENDENT_AMBULATORY_CARE_PROVIDER_SITE_OTHER): Payer: Self-pay

## 2014-06-07 ENCOUNTER — Ambulatory Visit
Admission: RE | Admit: 2014-06-07 | Discharge: 2014-06-07 | Disposition: A | Discharge status: Home / Self Care | Payer: Commercial Indemnity | Source: Ambulatory Visit

## 2014-06-07 DIAGNOSIS — Z1231 Encounter for screening mammogram for malignant neoplasm of breast: Secondary | ICD-10-CM

## 2014-06-19 ENCOUNTER — Emergency Department (HOSPITAL_COMMUNITY): Payer: Commercial Indemnity

## 2014-06-19 ENCOUNTER — Encounter (HOSPITAL_COMMUNITY): Payer: Self-pay | Admitting: *Deleted

## 2014-06-19 ENCOUNTER — Emergency Department (HOSPITAL_COMMUNITY)
Admit: 2014-06-19 | Discharge: 2014-06-19 | Disposition: A | Discharge status: Home / Self Care | Payer: Commercial Indemnity | Attending: Emergency Medicine | Admitting: Emergency Medicine

## 2014-06-19 ENCOUNTER — Emergency Department (HOSPITAL_COMMUNITY)
Admission: EM | Admit: 2014-06-19 | Discharge: 2014-06-19 | Disposition: A | Discharge status: Home / Self Care | Payer: Commercial Indemnity | Attending: Emergency Medicine | Admitting: Emergency Medicine

## 2014-06-19 DIAGNOSIS — Z79899 Other long term (current) drug therapy: Secondary | ICD-10-CM | POA: Diagnosis not present

## 2014-06-19 DIAGNOSIS — E0500 Thyrotoxicosis with diffuse goiter without thyrotoxic crisis or storm: Secondary | ICD-10-CM | POA: Diagnosis not present

## 2014-06-19 DIAGNOSIS — R079 Chest pain, unspecified: Secondary | ICD-10-CM | POA: Insufficient documentation

## 2014-06-19 DIAGNOSIS — M7122 Synovial cyst of popliteal space [Baker], left knee: Secondary | ICD-10-CM | POA: Diagnosis not present

## 2014-06-19 DIAGNOSIS — Z872 Personal history of diseases of the skin and subcutaneous tissue: Secondary | ICD-10-CM | POA: Diagnosis not present

## 2014-06-19 DIAGNOSIS — I10 Essential (primary) hypertension: Secondary | ICD-10-CM | POA: Diagnosis not present

## 2014-06-19 DIAGNOSIS — M79605 Pain in left leg: Secondary | ICD-10-CM | POA: Diagnosis present

## 2014-06-19 DIAGNOSIS — E669 Obesity, unspecified: Secondary | ICD-10-CM | POA: Diagnosis not present

## 2014-06-19 LAB — URINALYSIS, ROUTINE W REFLEX MICROSCOPIC
BILIRUBIN URINE: NEGATIVE
GLUCOSE, UA: NEGATIVE mg/dL
Hgb urine dipstick: NEGATIVE
KETONES UR: NEGATIVE mg/dL
LEUKOCYTES UA: NEGATIVE
Nitrite: NEGATIVE
Protein, ur: NEGATIVE mg/dL
Specific Gravity, Urine: 1.017 (ref 1.005–1.030)
UROBILINOGEN UA: 1 mg/dL (ref 0.0–1.0)
pH: 6.5 (ref 5.0–8.0)

## 2014-06-19 LAB — COMPREHENSIVE METABOLIC PANEL
ALBUMIN: 3.7 g/dL (ref 3.5–5.0)
ALT: 21 U/L (ref 14–54)
AST: 19 U/L (ref 15–41)
Alkaline Phosphatase: 69 U/L (ref 38–126)
Anion gap: 8 (ref 5–15)
BUN: 11 mg/dL (ref 6–20)
CALCIUM: 8.8 mg/dL — AB (ref 8.9–10.3)
CO2: 24 mmol/L (ref 22–32)
CREATININE: 0.71 mg/dL (ref 0.44–1.00)
Chloride: 107 mmol/L (ref 101–111)
GFR calc non Af Amer: >60 mL/min (ref >60)
GLUCOSE: 102 mg/dL — AB (ref 65–99)
POTASSIUM: 4.1 mmol/L (ref 3.5–5.1)
Sodium: 139 mmol/L (ref 135–145)
Total Bilirubin: 0.6 mg/dL (ref 0.3–1.2)
Total Protein: 5.8 g/dL — ABNORMAL LOW (ref 6.5–8.1)

## 2014-06-19 LAB — CBC WITH DIFFERENTIAL/PLATELET
BASOS ABS: 0.1 10*3/uL (ref 0.0–0.1)
BASOS PCT: 1 % (ref 0–1)
EOS PCT: 3 % (ref 0–5)
Eosinophils Absolute: 0.2 10*3/uL (ref 0.0–0.7)
HEMATOCRIT: 38 % (ref 36.0–46.0)
HEMOGLOBIN: 12.6 g/dL (ref 12.0–15.0)
Lymphocytes Relative: 33 % (ref 12–46)
Lymphs Abs: 1.8 10*3/uL (ref 0.7–4.0)
MCH: 29.4 pg (ref 26.0–34.0)
MCHC: 33.2 g/dL (ref 30.0–36.0)
MCV: 88.6 fL (ref 78.0–100.0)
MONOS PCT: 6 % (ref 3–12)
Monocytes Absolute: 0.3 10*3/uL (ref 0.1–1.0)
Neutro Abs: 3.2 10*3/uL (ref 1.7–7.7)
Neutrophils Relative %: 57 % (ref 43–77)
Platelets: 237 10*3/uL (ref 150–400)
RBC: 4.29 MIL/uL (ref 3.87–5.11)
RDW: 13.6 % (ref 11.5–15.5)
WBC: 5.5 10*3/uL (ref 4.0–10.5)

## 2014-06-19 LAB — I-STAT TROPONIN, ED: TROPONIN I, POC: 0 ng/mL (ref 0.00–0.08)

## 2014-06-19 LAB — D-DIMER, QUANTITATIVE (NOT AT ARMC): D-Dimer, Quant: 0.48 ug/mL-FEU (ref 0.00–0.48)

## 2014-06-19 MED ORDER — OXYCODONE-ACETAMINOPHEN 5-325 MG PO TABS: 2.0000 | ORAL_TABLET | Freq: Once | ORAL | Status: DC

## 2014-06-19 NOTE — ED Provider Notes (Signed)
CSN: [id number]     Arrival date & time 06/19/14  [id number] History   First MD Initiated Contact with Patient 06/19/14 [phone]     Chief Complaint  Patient presents with  . Leg Pain     (Consider location/radiation/quality/duration/timing/severity/associated sxs/prior Treatment) HPI 59 year old female with some history of chronic edema of both legs presents today with increased bilateral lower extremity swelling left greater than right that she has noted over the past day and a half. She has not had any known injury. She does have some upper chest discomfort that has been present for 24 hours. She describes it as pressure and constant. It radiates from right shoulder to left shoulder and back. She has not had any similar pain in the past. She does have some dyspnea on exertion over the past month when she has attempted to go for a walk. She has attempted to go for walk twice with one episode being a month ago and the other episode being one week ago. Any dyspnea at rest. She denies any history of DVT or pulmonary embolism. She has had some knee problems but has not been immobilized from surgery or on any extended trips recently. Past Medical History  Diagnosis Date  . Chest pain   . Grave's disease   . HTN (hypertension)   . HLD (hyperlipidemia)   . Obesity   . Chronic dermatitis   . Edema of both legs   . Varicose veins    Past Surgical History  Procedure Laterality Date  . Cholecystectomy    . Skin graft    . Endovenous ablation saphenous vein w/ laser     Family History  Problem Relation Age of Onset  . Coronary artery disease    . Cancer Mother     lung  . Other Mother     varicose veins  . Heart disease Father   . Hyperlipidemia Father   . Hypertension Father   . Other Father     pvd  . Cancer Sister    History  Substance Use Topics  . Smoking status: Never Smoker   . Smokeless tobacco: Never Used  . Alcohol Use: No   OB History    No data available     Review of  Systems  All other systems reviewed and are negative.     Allergies  Review of patient's allergies indicates no known allergies.  Home Medications   Prior to Admission medications   Medication Sig Start Date End Date Taking? Authorizing Provider  acetaminophen (TYLENOL) 500 MG tablet Take 500 mg by mouth every 6 (six) hours as needed.      Historical Provider, MD  ibuprofen (ADVIL,MOTRIN) 200 MG tablet Take 200 mg by mouth every 6 (six) hours as needed.      Historical Provider, MD  levothyroxine (SYNTHROID, LEVOTHROID) 175 MCG tablet Take 0.15 mcg by mouth daily. Take 0.15 mcg per day    Historical Provider, MD   BP 144/60 mmHg  Pulse 80  Temp(Src) 98 F (36.7 C) (Oral)  Resp 22  SpO2 98% Physical Exam  Constitutional: She is oriented to person, place, and time. She appears well-developed and well-nourished.  Obese  HENT:  Head: Normocephalic and atraumatic.  Right Ear: External ear normal.  Left Ear: External ear normal.  Nose: Nose normal.  Mouth/Throat: Oropharynx is clear and moist.  Eyes: Conjunctivae and EOM are normal. Pupils are equal, round, and reactive to light.  Neck: Normal range of motion. Neck supple.  Cardiovascular: Normal rate, regular rhythm, normal heart sounds and intact distal pulses.   Pulmonary/Chest: Effort normal and breath sounds normal.  Abdominal: Soft. Bowel sounds are normal.  Musculoskeletal: Normal range of motion. She exhibits edema and tenderness.       Legs: Neurological: She is alert and oriented to person, place, and time. She has normal reflexes.  Skin: Skin is warm and dry.  Psychiatric: She has a normal mood and affect. Her behavior is normal. Judgment and thought content normal.  Nursing note and vitals reviewed.   ED Course  Procedures (including critical care time) Labs Review Labs Reviewed  CBC WITH DIFFERENTIAL/PLATELET  D-DIMER, QUANTITATIVE  COMPREHENSIVE METABOLIC PANEL  I-STAT TROPOININ, ED    Imaging Review Dg  Chest 2 View  06/19/2014   CLINICAL DATA:  Bilateral clavicle and leg pain.  EXAM: CHEST  2 VIEW  COMPARISON:  05/30/2010  FINDINGS: Upper normal heart size. Clear lungs. No pleural effusion or pneumothorax.  IMPRESSION: No active cardiopulmonary disease.   Electronically Signed   By: Jolaine ClickArthur  Hoss M.D.   On: 06/19/2014 10:34     EKG Interpretation   Date/Time:  Saturday Jun 19 2014 09:26:40 EDT Ventricular Rate:  68 PR Interval:  160 QRS Duration: 91 QT Interval:  420 QTC Calculation: 447 R Axis:   2 Text Interpretation:  Sinus rhythm Confirmed by Breindel Collier MD, Winnie Barsky ([id number])  on 06/19/2014 10:20:08 AM      MDM   Final diagnoses:  Chest pain  Baker's cyst of knee, left  Chest pain, unspecified chest pain type    This is a 59 year old female with chronic edema comes in complaining of some left lower extreme pain and increased bilateral swelling. She has no evidence of overload. No evidence of DVT is seen on upper. She does have a Baker's cyst on the left which would be consistent with the pain she is having. She had some tightness across her chest which is present present for more than 24 hours and has a normal EKG with negative troponin. I have low index of suspicion for coronary source of chest pain.  Patient is advised regarding the Baker's cyst and need for close follow-up.    Margarita Grizzleanielle Sakshi Sermons, MD 06/19/14 [phone]

## 2014-06-19 NOTE — Progress Notes (Signed)
*  PRELIMINARY RESULTS* Vascular Ultrasound Lower extremity venous duplex has been completed.  Preliminary findings: no evidence of DVT. Bilateral baker's cyst noted.   Farrel DemarkJill Eunice, RDMS, RVT  06/19/2014, 10:51 AM

## 2014-06-19 NOTE — ED Notes (Signed)
Pt walked to bathroom to give urine sample with no problems.

## 2014-06-19 NOTE — Discharge Instructions (Signed)
Chest Pain (Nonspecific) It is often hard to give a diagnosis for the cause of chest pain. There is always a chance that your pain could be related to something serious, such as a heart attack or a blood clot in the lungs. You need to follow up with your doctor. HOME CARE  If antibiotic medicine was given, take it as directed by your doctor. Finish the medicine even if you start to feel better.  For the next few days, avoid activities that bring on chest pain. Continue physical activities as told by your doctor.  Do not use any tobacco products. This includes cigarettes, chewing tobacco, and e-cigarettes.  Avoid drinking alcohol.  Only take medicine as told by your doctor.  Follow your doctor's suggestions for more testing if your chest pain does not go away.  Keep all doctor visits you made. GET HELP IF:  Your chest pain does not go away, even after treatment.  You have a rash with blisters on your chest.  You have a fever. GET HELP RIGHT AWAY IF:   You have more pain or pain that spreads to your arm, neck, jaw, back, or belly (abdomen).  You have shortness of breath.  You cough more than usual or cough up blood.  You have very bad back or belly pain.  You feel sick to your stomach (nauseous) or throw up (vomit).  You have very bad weakness.  You pass out (faint).  You have chills. This is an emergency. Do not wait to see if the problems will go away. Call your local emergency services (911 in [place] Do not drive yourself to the hospital. MAKE SURE YOU:   Understand these instructions.  Will watch your condition.  Will get help right away if you are not doing well or get worse. Document Released: 07/11/2007 Document Revised: 01/27/2013 Document Reviewed: 07/11/2007 [facility] Patient Information 2015 [employer], [place]. This information is not intended to replace advice given to you by your health care provider. Make sure you discuss any questions you have with your  health care provider. Baker Cyst A Baker cyst is a sac-like structure that forms in the back of the knee. It is filled with the same fluid that is located in your knee. This fluid lubricates the bones and cartilage of the knee and allows them to move over each other more easily. CAUSES  When the knee becomes injured or inflamed, increased fluid forms in the knee. When this happens, the joint lining is pushed out behind the knee and forms the Baker cyst. This cyst may also be caused by inflammation from arthritic conditions and infections. SIGNS AND SYMPTOMS  A Baker cyst usually has no symptoms. When the cyst is substantially enlarged:  You may feel pressure behind the knee, stiffness in the knee, or a mass in the area behind the knee.  You may develop pain, redness, and swelling in the calf. This can suggest a blood clot and requires evaluation by your health care provider. DIAGNOSIS  A Baker cyst is most often found during an ultrasound exam. This exam may have been performed for other reasons, and the cyst was found incidentally. Sometimes an MRI is used. This picks up other problems within a joint that an ultrasound exam may not. If the Baker cyst developed immediately after an injury, X-Rachit Grim exams may be used to diagnose the cyst. TREATMENT  The treatment depends on the cause of the cyst. Anti-inflammatory medicines and rest often will be prescribed. If the cyst  is caused by a bacterial infection, antibiotic medicines may be prescribed.  HOME CARE INSTRUCTIONS   If the cyst was caused by an injury, for the first 24 hours, keep the injured leg elevated on 2 pillows while lying down.  For the first 24 hours while you are awake, apply ice to the injured area:  Put ice in a plastic bag.  Place a towel between your skin and the bag.  Leave the ice on for 20 minutes, 2-3 times a day.  Only take over-the-counter or prescription medicines for pain, discomfort, or fever as directed by your  health care provider.  Only take antibiotic medicine as directed. Make sure to finish it even if you start to feel better. MAKE SURE YOU:   Understand these instructions.  Will watch your condition.  Will get help right away if you are not doing well or get worse. Document Released: 01/22/2005 Document Revised: 11/12/2012 Document Reviewed: 09/03/2012 [facility] Patient Information 2015 [place], [place]. This information is not intended to replace advice given to you by your health care provider. Make sure you discuss any questions you have with your health care provider.

## 2014-06-19 NOTE — ED Notes (Signed)
Pt reports hx of knee pain, has been getting injections in both knees. Now having pain to right hip area and reports having difficulty ambulating due to her "legs giving out on her."

## 2014-07-20 ENCOUNTER — Other Ambulatory Visit: Payer: Self-pay | Admitting: *Deleted

## 2014-07-20 DIAGNOSIS — I83893 Varicose veins of bilateral lower extremities with other complications: Secondary | ICD-10-CM

## 2014-08-04 ENCOUNTER — Encounter: Payer: Self-pay | Admitting: Vascular Surgery

## 2014-08-10 ENCOUNTER — Ambulatory Visit (INDEPENDENT_AMBULATORY_CARE_PROVIDER_SITE_OTHER): Payer: Commercial Indemnity | Admitting: Vascular Surgery

## 2014-08-10 ENCOUNTER — Encounter: Payer: Self-pay | Admitting: Vascular Surgery

## 2014-08-10 ENCOUNTER — Ambulatory Visit (HOSPITAL_COMMUNITY)
Admission: RE | Admit: 2014-08-10 | Discharge: 2014-08-10 | Disposition: A | Discharge status: Home / Self Care | Payer: Commercial Indemnity | Source: Ambulatory Visit | Attending: Vascular Surgery | Admitting: Vascular Surgery

## 2014-08-10 VITALS — BP 136/75 | HR 78 | Ht 66.5 in | Wt 276.6 lb

## 2014-08-10 DIAGNOSIS — I83893 Varicose veins of bilateral lower extremities with other complications: Secondary | ICD-10-CM | POA: Insufficient documentation

## 2014-08-10 DIAGNOSIS — I83899 Varicose veins of unspecified lower extremities with other complications: Secondary | ICD-10-CM | POA: Insufficient documentation

## 2014-08-10 HISTORY — DX: Varicose veins of unspecified lower extremity with other complications: I83.899

## 2014-08-10 NOTE — Progress Notes (Signed)
Subjective:     Patient ID: Cheryl [name], female   DOB: 08/02/1955, 59 y.o.   MRN: [id number]  HPI this 59 year old female returns 2 years post-laser ablation bilateral great saphenous veins for bilateral gross reflux with pain and severe skin changes lower third both legs. She states that recently she has developed some aching discomfort in the lower thighs and proximal calves of both legs. Her edema has not worsened. She does wear short leg elastic compression stockings but not on a regular basis. She's had no new episodes of thrombophlebitis or DVT or stasis ulcers. She has not noticed any new bulging varicosities.  Past Medical History  Diagnosis Date  . Chest pain   . Grave's disease   . HTN (hypertension)   . HLD (hyperlipidemia)   . Obesity   . Chronic dermatitis   . Edema of both legs   . Varicose veins   . Anemia     History  Substance Use Topics  . Smoking status: Never Smoker   . Smokeless tobacco: Never Used  . Alcohol Use: No    Family History  Problem Relation Age of Onset  . Coronary artery disease    . Cancer Mother     lung  . Other Mother     varicose veins  . Varicose Veins Mother   . Heart disease Father   . Hyperlipidemia Father   . Hypertension Father   . Other Father     pvd  . Heart attack Father   . Peripheral vascular disease Father     amputation  . Cancer Sister   . Cancer Sister     No Known Allergies   Current outpatient prescriptions:  .  acetaminophen (TYLENOL) 500 MG tablet, Take 500 mg by mouth every 6 (six) hours as needed.  , Disp: , Rfl:  .  ibuprofen (ADVIL,MOTRIN) 200 MG tablet, Take 200 mg by mouth every 6 (six) hours as needed.  , Disp: , Rfl:  .  levothyroxine (SYNTHROID, LEVOTHROID) 150 MCG tablet, Take 150 mcg by mouth daily., Disp: , Rfl: 5  Filed Vitals:   08/10/14 1345  BP: 136/75  Pulse: 78  Height: 5' 6.5" (1.689 m)  Weight: 276 lb 9.6 oz (125.465 kg)  SpO2: 100%    Body mass index is 43.98  kg/(m^2).           Review of Systems denies chest pain, dyspnea on exertion, PND, orthopnea, hemoptysis.     Objective:   Physical Exam BP 136/75 mmHg  Pulse 78  Ht 5' 6.5" (1.689 m)  Wt 276 lb 9.6 oz (125.465 kg)  BMI 43.98 kg/m2  SpO2 100%  General obese female in no apparent distress alert and oriented 3 Lungs no rhonchi or wheezing Cardiovascular regular rhythm no murmurs Bilateral lower extremities with severe hyperpigmentation lower third of legs with 0-1+ edema distally. 3+ dorsalis pedis pulse palpable bilaterally. No active ulceration noted. Skin changes appear stable. A few small bulging varicosities left distal medial thigh.  Today our venous duplex exam of both legs which I reviewed and interpreted. There is no DVT. There is successful closure of bilateral great saphenous veins up to near the saphenofemoral junction. There is a short anterior accessory branch bilaterally with gross reflux. There is also one incompetent perforator in the left mid calf.     Assessment:     Status post successful laser ablation bilateral great saphenous veins for severe skin changes due to gross reflux area Persistent discomfort both  legs with no evidence of new changes in venous system    Plan:     #1 elevated but a bed 2-3 inches #2 short leg elastic compression stockings on a daily basis to be placed immediately after getting up in the morning #3 nothing further to add from a vascular standpoint as superficial system has been treated successfully Return to see me on when necessary basis

## 2014-08-12 ENCOUNTER — Encounter: Payer: Self-pay | Admitting: Sports Medicine

## 2014-09-15 ENCOUNTER — Other Ambulatory Visit (HOSPITAL_COMMUNITY): Payer: Self-pay | Admitting: Sports Medicine

## 2014-09-15 DIAGNOSIS — M1711 Unilateral primary osteoarthritis, right knee: Secondary | ICD-10-CM

## 2014-09-24 ENCOUNTER — Encounter (HOSPITAL_COMMUNITY)
Admission: RE | Admit: 2014-09-24 | Discharge: 2014-09-24 | Disposition: A | Discharge status: Home / Self Care | Payer: Commercial Indemnity | Source: Ambulatory Visit | Attending: Sports Medicine | Admitting: Sports Medicine

## 2014-09-24 DIAGNOSIS — M1711 Unilateral primary osteoarthritis, right knee: Secondary | ICD-10-CM | POA: Diagnosis not present

## 2014-09-24 MED ORDER — TECHNETIUM TC 99M MEDRONATE IV KIT
25.0000 | PACK | Freq: Once | INTRAVENOUS | Status: AC | PRN
  Administered 2014-09-24: 25.3 via INTRAVENOUS

## 2014-11-08 ENCOUNTER — Ambulatory Visit: Payer: Commercial Indemnity | Attending: Family Medicine | Admitting: Physical Therapy

## 2014-11-08 DIAGNOSIS — M25662 Stiffness of left knee, not elsewhere classified: Secondary | ICD-10-CM | POA: Diagnosis not present

## 2014-11-08 DIAGNOSIS — M25661 Stiffness of right knee, not elsewhere classified: Secondary | ICD-10-CM | POA: Insufficient documentation

## 2014-11-08 DIAGNOSIS — R2991 Unspecified symptoms and signs involving the musculoskeletal system: Secondary | ICD-10-CM | POA: Insufficient documentation

## 2014-11-08 DIAGNOSIS — R262 Difficulty in walking, not elsewhere classified: Secondary | ICD-10-CM | POA: Insufficient documentation

## 2014-11-08 DIAGNOSIS — Z7409 Other reduced mobility: Secondary | ICD-10-CM

## 2014-11-08 NOTE — Therapy (Signed)
[facility] [address] [place], [place], [address] Phone: [phone]   Fax:  [phone]  Physical Therapy Evaluation  Patient Details  Name: Cheryl [name] MRN: [id number] Date of Birth: 07/11/1955 Referring Provider:  Barbie Banner, MD  Encounter Date: 11/08/2014      PT End of Session - 11/08/14 1515    Visit Number 1   Number of Visits 12   Date for PT Re-Evaluation 01/03/15   PT Start Time 1345   PT Stop Time 1445   PT Time Calculation (min) 60 min   Activity Tolerance Patient tolerated treatment well;Patient limited by pain   Behavior During Therapy [facility] for tasks assessed/performed      Past Medical History  Diagnosis Date  . Chest pain   . Grave's disease   . HTN (hypertension)   . HLD (hyperlipidemia)   . Obesity   . Chronic dermatitis   . Edema of both legs   . Varicose veins   . Anemia     Past Surgical History  Procedure Laterality Date  . Cholecystectomy    . Skin graft    . Endovenous ablation saphenous vein w/ laser      There were no vitals filed for this visit.  Visit Diagnosis:  Knee stiffness, left  Knee stiff, right  Difficulty walking due to knee joint  Impaired transfers      Subjective Assessment - 11/08/14 1345    Subjective Pt presetns for knee/LE pain.  She has been seeing for injections in bilateral knee OA.  She currently has pain into both thighs, Rt. groin/post hips at times.  Denies sensory changes.  She has difficulty with standing, walking, getting in and out of the car, transfers, working.  She needs help to do yardwork, mowing lawn.  She is unable to go for walks with her sisters as she did up to a year ago becasue of leg pain and fearful of falling and not making back to her home.  Dr. Penni Bombard has been treating her knees and she has improved some but still has this thigh pain.     Pertinent History HTN, Varicose veins, edema, thyroid   Limitations  Sitting;Lifting;Standing;Walking;House hold activities;Other (comment)  Work   How long can you sit comfortably? knees get stiff when she sits over 20-30 min    How long can you stand comfortably? stands 1-4 hours at a time, can sometimes be 8 hours per day   How long can you walk comfortably? cannot walk >15 min regularly    Diagnostic tests MRI 2015 with medial meniscus tear, tricompartmental OA,  XR, bone scan    Patient Stated Goals pain relief for improved standing, walking and work   Currently in Pain? Yes   Pain Score 2    Pain Location Knee   Pain Orientation Right;Left;Posterior   Pain Descriptors / Indicators Tightness   Pain Type Chronic pain   Pain Radiating Towards thighs (post and ant)   Pain Onset More than a month ago   Pain Frequency Constant   Aggravating Factors  standing, sitting too long    Pain Relieving Factors changing positions, Tylenol   Effect of Pain on Daily Activities makes daily activities and work difficult   Multiple Pain Sites No            [facility] PT Assessment - 11/08/14 1349    Assessment   Medical Diagnosis knees, OA, pain   Onset Date/Surgical Date --  knees in  2015   Next MD Visit 11/24/14   Prior Therapy long ago   Precautions   Precautions None   Restrictions   Weight Bearing Restrictions No   Balance Screen   Has the patient fallen in the past 6 months Yes   How many times? 1   Has the patient had a decrease in activity level because of a fear of falling?  Yes   Is the patient reluctant to leave their home because of a fear of falling?  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    Overall Cognitive Status Within Functional Limits for tasks assessed   Observation/Other Assessments   Focus on Therapeutic Outcomes (FOTO)  72%  goal 52%   Observation/Other Assessments-Edema    Edema --  has LE swelling   Sensation   Light Touch Appears Intact   Coordination   Gross Motor Movements are Fluid and Coordinated Not tested   Single Leg Stance   Comments limited to attempt only    Posture/Postural Control   Posture/Postural Control Postural limitations   Postural Limitations Forward head;Flexed trunk   Posture Comments knees flexed   AROM   Right Knee Extension -10   Right Knee Flexion 110  pain   Left Knee Extension -15   Left Knee Flexion 105  pain   Lumbar Flexion WFL   Lumbar Extension 25%  discomfort Rt. lumbar   Lumbar - Right Side Bend 25% pain on R.t    Lumbar - Left Side Bend WNL pain on Rt.    Lumbar - Right Rotation 50% pain on Rt.    Lumbar - Left Rotation 25%    Strength   Right Hip Flexion 4+/5   Right Hip Extension 5/5   Right Hip ABduction 5/5   Left Hip Flexion 4+/5   Left Hip Extension 5/5   Left Hip ABduction 5/5   Right Knee Flexion 4+/5   Right Knee Extension 5/5   Left Knee Flexion 4+/5   Left Knee Extension 4+/5   Flexibility   Soft Tissue Assessment /Muscle Length yes  tight gastroc   Hamstrings 70-80 deg   Quadriceps tight   Palpation   Patella mobility hypomobile bilat. no pain    Palpation comment min pain medial aspect of L knee and posteriorly. General muscle soreness in ant/post thighs and ant tibialis         MHP to both knees, ant and post in supine. 10 min, reports less stiffness post heat.           PT Education - 11/08/14 1514    Education provided Yes   Education Details PT/POC, differential diag and eval findings, HEP   Person(s) Educated Patient   Methods Explanation;Demonstration;Handout   Comprehension Verbalized understanding;Returned demonstration;Need further instruction          PT  Short Term Goals - 11/08/14 1524    PT SHORT TERM GOAL #1   Title Pt will be I with HEP for LEs   Time 4   Period Weeks   Status New   PT SHORT TERM GOAL #2  Title Pt will report min improvement in ability to perform car transfers, sit to stand, toilet transfers.    Time 4   Period Weeks   Status New   PT SHORT TERM GOAL #3   Title Pt will be I with use of RICE, MHP and body mechanics for self care/pain mgmt   Time 4   Period Weeks   Status New   PT SHORT TERM GOAL #4   Title Pt will complete balance screen and set goal if appropriate   Time 4   Period Weeks   Status New           PT Long Term Goals - 11/08/14 1525    PT LONG TERM GOAL #1   Title Pt will be I with more advanced HEP for LEs   Time 8   Period Weeks   Status New   PT LONG TERM GOAL #2   Title Pt will score 55% or less impaired on FOTO to demo increased functional mobility.    Time 8   Period Weeks   Status New   PT LONG TERM GOAL #3   Title Pt will be able to walk for 30 min as desired with no more than mod pain in LEs.    Time 8   Period Weeks   Status New   PT LONG TERM GOAL #4   Title Pt will be able to do yardwork with less rest breaks and pain min overall in LEs.    Time 8   Period Weeks   Status New   PT LONG TERM GOAL #5   Title Pt will be able to perform car transfers with min occasional difficulty   Time 8   Period Weeks   Status New               Plan - 11/08/14 1515    Clinical Impression Statement Patient presents with symptoms consistent with knee OA, including stiffness on knees (extension and also ankle DF).  Of concern is the radiation of pain in post/ant thighs which presents with periods of walking. I saw that a bone scan was done and venous study was typical for OA.  We will continue to monitor her with exercise to progress mobility and R/O claudication, lumbar involvement.     Pt will benefit from skilled therapeutic intervention in order to improve on the following  deficits Postural dysfunction;Difficulty walking;Decreased activity tolerance;Decreased mobility;Impaired flexibility;Decreased range of motion;Decreased balance;Hypomobility;Pain;Increased edema;Increased fascial restricitons   Rehab Potential Good   PT Frequency 2x / week   PT Duration 8 weeks  6-8 weeks if progressing    PT Treatment/Interventions Electrical Stimulation;Iontophoresis 4mg /ml Dexamethasone;Moist Heat;Ultrasound;Patient/family education;Taping;Passive range of motion;Balance training;Therapeutic exercise;Cryotherapy;Functional mobility training;Manual techniques;Therapeutic activities   PT Next Visit Plan check HEP for hamstring, calf stretching, knee ext., NuStep, balance screen   PT Home Exercise Plan See above   Consulted and Agree with Plan of Care Patient         Problem List Patient Active Problem List   Diagnosis Date Noted  . 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 Leg edema 09/02/2012  . Varicose veins of lower extremities with other complications 09/02/2012  . Pericarditis 10/05/2010  . Chest pain 06/15/2010  . Edema 06/15/2010  . Thyroid disease 06/15/2010    Timiya Howells 11/08/2014, 3:32 PM  [facility] [address] [place], [place], [address] Phone: [phone]   Fax:  [phone]   Karie Mainland, PT  11/08/2014 3:33 PM Phone: [phone] Fax: [phone]

## 2014-11-10 ENCOUNTER — Ambulatory Visit: Payer: Commercial Indemnity | Admitting: Physical Therapy

## 2014-11-10 DIAGNOSIS — R262 Difficulty in walking, not elsewhere classified: Secondary | ICD-10-CM

## 2014-11-10 DIAGNOSIS — M25662 Stiffness of left knee, not elsewhere classified: Secondary | ICD-10-CM | POA: Diagnosis not present

## 2014-11-10 DIAGNOSIS — R2991 Unspecified symptoms and signs involving the musculoskeletal system: Secondary | ICD-10-CM

## 2014-11-10 DIAGNOSIS — Z7409 Other reduced mobility: Secondary | ICD-10-CM

## 2014-11-10 DIAGNOSIS — M25661 Stiffness of right knee, not elsewhere classified: Secondary | ICD-10-CM

## 2014-11-10 NOTE — Patient Instructions (Signed)
      Raise leg until knee is straight. __10_ reps per set, _2__ sets per day, ___7 days per week  Copyright  VHI. All rights reserved.  Short Arc [employer] a large can or rolled towel under leg. Straighten knee and leg. Hold _5___ seconds. Repeat with other leg. Repeat __10__ times. Do _2___ sessions per day.  http://gt2.exer.us/365   Copyright  VHI. All rights reserved.  Quad Set  PLACE SMALL TOWEL ROLL UNDER KNEE  Slowly tighten muscles on thigh of straight leg while pressing back of knee into towel Hold  _5__ sec. Repeat __10__ times each leg. Do __2__ sessions per day.  http://gt2.exer.us/361   Copyright  VHI. All rights reserved.

## 2014-11-10 NOTE — Therapy (Signed)
[name], [name], [address] Phone: [phone]   Fax:  [phone]  Physical Therapy Treatment  Patient Details  Name: Cheryl [name] MRN: [id number] Date of Birth: 12-28-1955 Referring Provider:  Christain Sacramento, MD  Encounter Date: 11/10/2014      PT End of Session - 11/10/14 1526    Visit Number 2   Number of Visits 12   Date for PT Re-Evaluation 01/03/15   PT Start Time 0306   PT Stop Time 0403   PT Time Calculation (min) 57 min      Past Medical History  Diagnosis Date  . Chest pain   . Grave's disease   . HTN (hypertension)   . HLD (hyperlipidemia)   . Obesity   . Chronic dermatitis   . Edema of both legs   . Varicose veins   . Anemia     Past Surgical History  Procedure Laterality Date  . Cholecystectomy    . Skin graft    . Endovenous ablation saphenous vein w/ laser      There were no vitals filed for this visit.  Visit Diagnosis:  Knee stiffness, left  Knee stiff, right  Difficulty walking due to knee joint  Impaired transfers      Subjective Assessment - 11/10/14 1515    Currently in Pain? Yes   Pain Score 7    Pain Location Knee   Pain Orientation Left;Right;Posterior;Lateral   Pain Descriptors / Indicators --  like something hit me   Aggravating Factors  standing too long   Pain Relieving Factors changing positions            [facility] PT Assessment - 11/10/14 1508    Standardized Balance Assessment   Standardized Balance Assessment Berg Balance Test   Berg Balance Test   Sit to Stand Able to stand without using hands and stabilize independently   Standing Unsupported Able to stand safely 2 minutes   Sitting with Back Unsupported but Feet Supported on Floor or Stool Able to sit safely and securely 2 minutes   Stand to Sit Sits safely with minimal use of hands   Transfers Able to transfer safely, minor use of hands   Standing Unsupported with Eyes Closed  Able to stand 10 seconds safely   Standing Ubsupported with Feet Together Able to place feet together independently and stand 1 minute safely   From Standing, Reach Forward with Outstretched Arm Can reach confidently >25 cm (10")   From Standing Position, Pick up Object from Floor Able to pick up shoe safely and easily   From Standing Position, Turn to Look Behind Over each Shoulder Looks behind from both sides and weight shifts well   Turn 360 Degrees Able to turn 360 degrees safely in 4 seconds or less   Standing Unsupported, Alternately Place Feet on Step/Stool Able to stand independently and safely and complete 8 steps in 20 seconds   Standing Unsupported, One Foot in Front Able to place foot tandem independently and hold 30 seconds   Standing on One Leg Tries to lift leg/unable to hold 3 seconds but remains standing independently   Total Score 53                     [facility] Adult PT Treatment/Exercise - 11/10/14 1545    Knee/Hip Exercises: Aerobic   Nustep Nustep L3 UE/LE x 8 min   Knee/Hip Exercises: Seated   Long Arc Quad 10  reps   Knee/Hip Exercises: Supine   Quad Sets 10 reps   Modalities   Modalities Moist Heat   Moist Heat Therapy   Number Minutes Moist Heat 15 Minutes   Moist Heat Location Knee  anterior and posterior bilateral                PT Education - 11/10/14 1602    Education provided Yes   Education Details QS, SAQ, LAQ   Person(s) Educated Patient   Methods Explanation;Handout   Comprehension Verbalized understanding          PT Short Term Goals - 11/10/14 1557    PT SHORT TERM GOAL #1   Title Pt will be I with HEP for LEs   Time 4   Period Weeks   Status Achieved   PT SHORT TERM GOAL #2   Title Pt will report min improvement in ability to perform car transfers, sit to stand, toilet transfers.    Time 4   Period Weeks   Status On-going   PT SHORT TERM GOAL #3   Title Pt will be I with use of RICE, MHP and body mechanics for  self care/pain mgmt   Time 4   Period Weeks   Status On-going   PT SHORT TERM GOAL #4   Title Pt will complete balance screen and set goal if appropriate   Baseline Berg 53/56   Time 4   Period Weeks   Status Partially Met           PT Long Term Goals - 11/08/14 1525    PT LONG TERM GOAL #1   Title Pt will be I with more advanced HEP for LEs   Time 8   Period Weeks   Status New   PT LONG TERM GOAL #2   Title Pt will score 55% or less impaired on FOTO to demo increased functional mobility.    Time 8   Period Weeks   Status New   PT LONG TERM GOAL #3   Title Pt will be able to walk for 30 min as desired with no more than mod pain in LEs.    Time 8   Period Weeks   Status New   PT LONG TERM GOAL #4   Title Pt will be able to do yardwork with less rest breaks and pain min overall in LEs.    Time 8   Period Weeks   Status New   PT LONG TERM GOAL #5   Title Pt will be able to perform car transfers with min occasional difficulty   Time 8   Period Weeks   Status New               Plan - 11/10/14 1558    Clinical Impression Statement BERG 53/56. Pt is independent with inital HEP for stretching calf and hamstring. She reports increased pain today to 7/10 but is eager to get better. Trial of Nustep with pt able to complete 8 minutes at level 3 with cues for full knee extension. Instructed pt in supine and seated quad strengthening at home. Pt complains of left anterior hip and posterior hip pain as well as lateral thigh pain during exercises. HMP at end of session for pain relief.    PT Next Visit Plan check new HEP and proegress as tolerated; Knee rom and strength, continue nustep, check ITB and hip flexor        Problem List Patient Active Problem List  Diagnosis Date Noted  . Varicose veins of leg with complications 71/24/5809  . Leg edema 09/02/2012  . Varicose veins of lower extremities with other complications 98/33/8250  . Pericarditis 10/05/2010  .  Chest pain 06/15/2010  . Edema 06/15/2010  . Thyroid disease 06/15/2010    Dorene Ar, PTA 11/10/2014, 4:06 PM  [facility] [address] [place], [name], [address] Phone: [phone]   Fax:  [phone]

## 2014-11-15 ENCOUNTER — Ambulatory Visit: Payer: Commercial Indemnity | Admitting: Physical Therapy

## 2014-11-15 DIAGNOSIS — M25661 Stiffness of right knee, not elsewhere classified: Secondary | ICD-10-CM

## 2014-11-15 DIAGNOSIS — M25662 Stiffness of left knee, not elsewhere classified: Secondary | ICD-10-CM

## 2014-11-15 DIAGNOSIS — R262 Difficulty in walking, not elsewhere classified: Secondary | ICD-10-CM

## 2014-11-15 DIAGNOSIS — R2991 Unspecified symptoms and signs involving the musculoskeletal system: Secondary | ICD-10-CM

## 2014-11-15 DIAGNOSIS — Z7409 Other reduced mobility: Secondary | ICD-10-CM

## 2014-11-15 NOTE — Therapy (Signed)
[place], [name], [address] Phone: [phone]   Fax:  [phone]  Physical Therapy Treatment  Patient Details  Name: Cheryl [name] MRN: [id number] Date of Birth: 1955/07/13 Referring Provider:  Pedro Earls, MD  Encounter Date: 11/15/2014      PT End of Session - 11/15/14 1510    Visit Number 3   Number of Visits 12   Date for PT Re-Evaluation 01/03/15   PT Start Time 1510   PT Stop Time 0349   PT Time Calculation (min) 34 min      Past Medical History  Diagnosis Date  . Chest pain   . Grave's disease   . HTN (hypertension)   . HLD (hyperlipidemia)   . Obesity   . Chronic dermatitis   . Edema of both legs   . Varicose veins   . Anemia     Past Surgical History  Procedure Laterality Date  . Cholecystectomy    . Skin graft    . Endovenous ablation saphenous vein w/ laser      There were no vitals filed for this visit.  Visit Diagnosis:  Knee stiffness, left  Knee stiff, right  Difficulty walking due to knee joint  Impaired transfers      Subjective Assessment - 11/15/14 1511    Subjective pretty good i guess still hurting, both legs and back. She is having a hard time today concentrating because her granddaughter passed out in MVA a month ago   Currently in Pain? Yes   Pain Score 7    Pain Location Leg   Pain Orientation Right;Left   Pain Descriptors / Indicators Sharp   Pain Type Chronic pain   Pain Frequency Constant   Aggravating Factors  standing and get up from sitting   Pain Relieving Factors totally off but it comes back and still hurts   Multiple Pain Sites No                         [facility] Adult PT Treatment/Exercise - 11/15/14 0001    Knee/Hip Exercises: Stretches   Active Hamstring Stretch 3 reps  30 sec bilat   Gastroc Stretch 3 reps  30 sec   Knee/Hip Exercises: Aerobic   Nustep Nustep L4 X 6 min   Knee/Hip Exercises: Seated   Long  Arc Quad 10 reps   Knee/Hip Exercises: Supine   Quad Sets 10 reps   Modalities   Modalities Moist Heat   Moist Heat Therapy   Number Minutes Moist Heat 15 Minutes   Moist Heat Location Knee                  PT Short Term Goals - 11/10/14 1557    PT SHORT TERM GOAL #1   Title Pt will be I with HEP for LEs   Time 4   Period Weeks   Status Achieved   PT SHORT TERM GOAL #2   Title Pt will report min improvement in ability to perform car transfers, sit to stand, toilet transfers.    Time 4   Period Weeks   Status On-going   PT SHORT TERM GOAL #3   Title Pt will be I with use of RICE, MHP and body mechanics for self care/pain mgmt   Time 4   Period Weeks   Status On-going   PT SHORT TERM GOAL #4   Title Pt will complete balance screen and  set goal if appropriate   Baseline Berg 53/56   Time 4   Period Weeks   Status Partially Met           PT Long Term Goals - 11/08/14 1525    PT LONG TERM GOAL #1   Title Pt will be I with more advanced HEP for LEs   Time 8   Period Weeks   Status New   PT LONG TERM GOAL #2   Title Pt will score 55% or less impaired on FOTO to demo increased functional mobility.    Time 8   Period Weeks   Status New   PT LONG TERM GOAL #3   Title Pt will be able to walk for 30 min as desired with no more than mod pain in LEs.    Time 8   Period Weeks   Status New   PT LONG TERM GOAL #4   Title Pt will be able to do yardwork with less rest breaks and pain min overall in LEs.    Time 8   Period Weeks   Status New   PT LONG TERM GOAL #5   Title Pt will be able to perform car transfers with min occasional difficulty   Time 8   Period Weeks   Status New               Plan - 11/15/14 1531    Clinical Impression Statement Reviewed HEP, she did not know she had one will start at home today. She does get a little confused. Her grandaughter was killed in a MVA one month ago and she keeps thinking about that today.    PT Next  Visit Plan cont with strengthening , stretching, pain relief        Problem List Patient Active Problem List   Diagnosis Date Noted  . Varicose veins of leg with complications 22/77/3750  . Leg edema 09/02/2012  . Varicose veins of lower extremities with other complications 51/08/1250  . Pericarditis 10/05/2010  . Chest pain 06/15/2010  . Edema 06/15/2010  . Thyroid disease 06/15/2010     Natividad Brood, PTA  11/15/2014, 3:44 PM  [facility] [address] [place], [name], [address] Phone: [phone]   Fax:  [phone]

## 2014-11-17 ENCOUNTER — Ambulatory Visit: Payer: Commercial Indemnity | Admitting: Physical Therapy

## 2014-11-17 DIAGNOSIS — M25662 Stiffness of left knee, not elsewhere classified: Secondary | ICD-10-CM | POA: Diagnosis not present

## 2014-11-17 DIAGNOSIS — R262 Difficulty in walking, not elsewhere classified: Secondary | ICD-10-CM

## 2014-11-17 DIAGNOSIS — Z7409 Other reduced mobility: Secondary | ICD-10-CM

## 2014-11-17 DIAGNOSIS — M25661 Stiffness of right knee, not elsewhere classified: Secondary | ICD-10-CM

## 2014-11-17 DIAGNOSIS — R2991 Unspecified symptoms and signs involving the musculoskeletal system: Secondary | ICD-10-CM

## 2014-11-17 NOTE — Patient Instructions (Signed)
Knee Extension Mobilization: Hang (Prone)    With table supporting thighs, place __0__ pound weight on right ankle. Hold _1-2_ minutes. Repeat __1-2__ times per set.  Do _2___ sessions per day.

## 2014-11-17 NOTE — Therapy (Signed)
[place], [name], [address] Phone: [phone]   Fax:  [phone]  Physical Therapy Treatment  Patient Details  Name: Cheryl [name] MRN: [id number] Date of Birth: 11/05/1955 Referring Provider:  Christain Sacramento, MD  Encounter Date: 11/17/2014      PT End of Session - 11/17/14 1506    Visit Number 4   Number of Visits 12   Date for PT Re-Evaluation 01/03/15   PT Start Time 7341   PT Stop Time 1559   PT Time Calculation (min) 53 min      Past Medical History  Diagnosis Date  . Chest pain   . Grave's disease   . HTN (hypertension)   . HLD (hyperlipidemia)   . Obesity   . Chronic dermatitis   . Edema of both legs   . Varicose veins   . Anemia     Past Surgical History  Procedure Laterality Date  . Cholecystectomy    . Skin graft    . Endovenous ablation saphenous vein w/ laser      There were no vitals filed for this visit.  Visit Diagnosis:  No diagnosis found.      Subjective Assessment - 11/17/14 1510    Subjective Pt reports she feels the same as last visit.    Patient Stated Goals pain relief for improved standing, walking and work   Currently in Pain? Yes   Pain Score 7    Pain Location Leg(hamstring area and lower leg)   Pain Orientation Right;Left    Pain Descriptors / Indicators Sharp  bruised inside   Aggravating Factors  walking, standing up    Pain Relieving Factors rest, with legs elevated.             [facility] PT Assessment - 11/17/14 0001    Assessment   Medical Diagnosis knees, OA, pain          [facility] Adult PT Treatment/Exercise - 11/17/14 0001    Exercises   Exercises Knee/Hip   Knee/Hip Exercises: Stretches   Active Hamstring Stretch 3 reps  30 sec bilat   Active Hamstring Stretch Limitations supine with strap   Quad Stretch Left;3 reps   Gastroc Stretch 3 reps  30 sec   Knee/Hip Exercises: Aerobic   Nustep NuStep L2: 5 min    Knee/Hip  Exercises: Seated   Long Arc Quad 15 reps   Knee/Hip Exercises: Supine   Quad Sets 10 reps   Knee/Hip Exercises: Prone   Hamstring Curl 2 sets;10 reps   Prone Knee Hang 1 minute  3 reps    Modalities   Modalities Moist Heat   Moist Heat Therapy   Number Minutes Moist Heat 15 Minutes   Moist Heat Location Knee                PT Education - 11/17/14 1543    Education provided Yes   Education Details prone knee hang   Person(s) Educated Patient   Methods Explanation;Handout   Comprehension Verbalized understanding          PT Short Term Goals - 11/10/14 1557    PT SHORT TERM GOAL #1   Title Pt will be I with HEP for LEs   Time 4   Period Weeks   Status Achieved   PT SHORT TERM GOAL #2   Title Pt will report min improvement in ability to perform car transfers, sit to stand, toilet transfers.    Time  4   Period Weeks   Status On-going   PT SHORT TERM GOAL #3   Title Pt will be I with use of RICE, MHP and body mechanics for self care/pain mgmt   Time 4   Period Weeks   Status On-going   PT SHORT TERM GOAL #4   Title Pt will complete balance screen and set goal if appropriate   Baseline Berg 53/56   Time 4   Period Weeks   Status Partially Met           PT Long Term Goals - 11/08/14 1525    PT LONG TERM GOAL #1   Title Pt will be I with more advanced HEP for LEs   Time 8   Period Weeks   Status New   PT LONG TERM GOAL #2   Title Pt will score 55% or less impaired on FOTO to demo increased functional mobility.    Time 8   Period Weeks   Status New   PT LONG TERM GOAL #3   Title Pt will be able to walk for 30 min as desired with no more than mod pain in LEs.    Time 8   Period Weeks   Status New   PT LONG TERM GOAL #4   Title Pt will be able to do yardwork with less rest breaks and pain min overall in LEs.    Time 8   Period Weeks   Status New   PT LONG TERM GOAL #5   Title Pt will be able to perform car transfers with min occasional  difficulty   Time 8   Period Weeks   Status New               Plan - 11/17/14 1544    Clinical Impression Statement Pt reported increased pain in Lt SI joint with quad set and hamstring stretch.  Pt tolerated exercises with minimal increase in pain. Progressing slowly towards goals; reports 10-20% improvement with transfers since beginning therapy.    Pt will benefit from skilled therapeutic intervention in order to improve on the following deficits Postural dysfunction;Difficulty walking;Decreased activity tolerance;Decreased mobility;Impaired flexibility;Decreased range of motion;Decreased balance;Hypomobility;Pain;Increased edema;Increased fascial restricitons   Rehab Potential Good   PT Frequency 2x / week   PT Duration --  6-8 wks if progressing    PT Treatment/Interventions Electrical Stimulation;Iontophoresis 39m/ml Dexamethasone;Moist Heat;Ultrasound;Patient/family education;Taping;Passive range of motion;Balance training;Therapeutic exercise;Cryotherapy;Functional mobility training;Manual techniques;Therapeutic activities   PT Next Visit Plan cont with strengthening , stretching, pain relief.  Assess SI/ leg length discrepancy.  [facility] get on [facility]    PT Home Exercise Plan Pt to wear shorts/ exercise pants next visit for easier assessment/ treatment of knees.    Consulted and Agree with Plan of Care Patient        Problem List Patient Active Problem List   Diagnosis Date Noted  . Varicose veins of leg with complications 034/91/7915 . Leg edema 09/02/2012  . Varicose veins of lower extremities with other complications 005/69/7948 . Pericarditis 10/05/2010  . Chest pain 06/15/2010  . Edema 06/15/2010  . Thyroid disease 06/15/2010    JKerin Perna PTA 11/17/2014 4:50 PM  [facility] [address]Phone: [phone]  Fax:   [phone]

## 2014-11-22 ENCOUNTER — Ambulatory Visit: Payer: Commercial Indemnity | Admitting: Physical Therapy

## 2014-11-22 DIAGNOSIS — M25661 Stiffness of right knee, not elsewhere classified: Secondary | ICD-10-CM

## 2014-11-22 DIAGNOSIS — M25662 Stiffness of left knee, not elsewhere classified: Secondary | ICD-10-CM

## 2014-11-22 DIAGNOSIS — R262 Difficulty in walking, not elsewhere classified: Secondary | ICD-10-CM

## 2014-11-22 DIAGNOSIS — Z7409 Other reduced mobility: Secondary | ICD-10-CM

## 2014-11-22 DIAGNOSIS — R2991 Unspecified symptoms and signs involving the musculoskeletal system: Secondary | ICD-10-CM

## 2014-11-22 NOTE — Therapy (Signed)
[place], [name], [address] Phone: [phone]   Fax:  [phone]  Physical Therapy Treatment  Patient Details  Name: Cheryl [name] MRN: [id number] Date of Birth: 10-13-1955 No Data Recorded  Encounter Date: 11/22/2014      PT End of Session - 11/22/14 1559    Visit Number 5   Number of Visits 12   Date for PT Re-Evaluation 01/03/15   PT Start Time 0938   PT Stop Time 1610   PT Time Calculation (min) 55 min   Activity Tolerance Patient tolerated treatment well;Patient limited by pain   Behavior During Therapy [facility] for tasks assessed/performed      Past Medical History  Diagnosis Date  . Chest pain   . Grave's disease   . HTN (hypertension)   . HLD (hyperlipidemia)   . Obesity   . Chronic dermatitis   . Edema of both legs   . Varicose veins   . Anemia     Past Surgical History  Procedure Laterality Date  . Cholecystectomy    . Skin graft    . Endovenous ablation saphenous vein w/ laser      There were no vitals filed for this visit.  Visit Diagnosis:  Knee stiffness, left  Knee stiff, right  Difficulty walking due to knee joint  Impaired transfers      Subjective Assessment - 11/22/14 1519    Subjective States she feels "slightly better." Still having pain and difficulty walking.   Pertinent History HTN, Varicose veins, edema, thyroid   Patient Stated Goals pain relief for improved standing, walking and work   Currently in Pain? Yes   Pain Score 7    Pain Location Leg  mostly in knees   Pain Orientation Right;Left   Pain Descriptors / Indicators Sharp  bruised inside   Pain Type Chronic pain   Pain Radiating Towards thighs   Pain Onset More than a month ago   Pain Frequency Constant   Aggravating Factors  walking, standing up   Pain Relieving Factors rest, elevation                         [facility] Adult PT Treatment/Exercise - 11/22/14 1521    Knee/Hip Exercises: Stretches   Active Hamstring Stretch 3 reps   Active Hamstring Stretch Limitations supine with strap;    Gastroc Stretch 3 reps;30 seconds   Gastroc Stretch Limitations seated with strap   Knee/Hip Exercises: Aerobic   Nustep L3 x 8 min   Knee/Hip Exercises: Seated   Long Arc Quad 15 reps   Long Arc Quad Weight 2 lbs.   Modalities   Modalities Electrical Stimulation;Moist Heat   Moist Heat Therapy   Number Minutes Moist Heat 15 Minutes   Moist Heat Location Knee   Electrical Stimulation   Electrical Stimulation Location bil knees   Electrical Stimulation Action pre mod   Electrical Stimulation Parameters to tolerance   Electrical Stimulation Goals Pain                  PT Short Term Goals - 11/10/14 1557    PT SHORT TERM GOAL #1   Title Pt will be I with HEP for LEs   Time 4   Period Weeks   Status Achieved   PT SHORT TERM GOAL #2   Title Pt will report min improvement in ability to perform car transfers, sit to stand, toilet transfers.  Time 4   Period Weeks   Status On-going   PT SHORT TERM GOAL #3   Title Pt will be I with use of RICE, MHP and body mechanics for self care/pain mgmt   Time 4   Period Weeks   Status On-going   PT SHORT TERM GOAL #4   Title Pt will complete balance screen and set goal if appropriate   Baseline Berg 53/56   Time 4   Period Weeks   Status Partially Met           PT Long Term Goals - 11/08/14 1525    PT LONG TERM GOAL #1   Title Pt will be I with more advanced HEP for LEs   Time 8   Period Weeks   Status New   PT LONG TERM GOAL #2   Title Pt will score 55% or less impaired on FOTO to demo increased functional mobility.    Time 8   Period Weeks   Status New   PT LONG TERM GOAL #3   Title Pt will be able to walk for 30 min as desired with no more than mod pain in LEs.    Time 8   Period Weeks   Status New   PT LONG TERM GOAL #4   Title Pt will be able to do yardwork with less rest breaks  and pain min overall in LEs.    Time 8   Period Weeks   Status New   PT LONG TERM GOAL #5   Title Pt will be able to perform car transfers with min occasional difficulty   Time 8   Period Weeks   Status New               Plan - 11/22/14 1559    Clinical Impression Statement Pt with limited tolerance to exercises due to pain.  Trialed e stim to see if pt has increased relief.  Will continue to benefit from PT to maximize funciton.   PT Next Visit Plan cont with strengthening , stretching, pain relief.  Assess SI/ leg length discrepancy.  Try to get on consistent team schedule (per [facility] request).    PT Home Exercise Plan Pt to wear shorts/ exercise pants next visit for easier assessment/ treatment of knees.         Problem List Patient Active Problem List   Diagnosis Date Noted  . Varicose veins of leg with complications 88/32/5498  . Leg edema 09/02/2012  . Varicose veins of lower extremities with other complications 26/41/5830  . Pericarditis 10/05/2010  . Chest pain 06/15/2010  . Edema 06/15/2010  . Thyroid disease 06/15/2010   Laureen Abrahams, PT, DPT 11/22/2014 8:05 PM  [facility] [address] [place], [name], [address] Phone: [phone]   Fax:  [phone]  Name: Cheryl [name] MRN: [id number] Date of Birth: 1955-10-24

## 2014-11-25 ENCOUNTER — Ambulatory Visit: Payer: Commercial Indemnity | Admitting: Physical Therapy

## 2014-11-25 DIAGNOSIS — M25662 Stiffness of left knee, not elsewhere classified: Secondary | ICD-10-CM

## 2014-11-25 DIAGNOSIS — M25661 Stiffness of right knee, not elsewhere classified: Secondary | ICD-10-CM

## 2014-11-25 DIAGNOSIS — Z7409 Other reduced mobility: Secondary | ICD-10-CM

## 2014-11-25 DIAGNOSIS — R2991 Unspecified symptoms and signs involving the musculoskeletal system: Secondary | ICD-10-CM

## 2014-11-25 DIAGNOSIS — R262 Difficulty in walking, not elsewhere classified: Secondary | ICD-10-CM

## 2014-11-25 NOTE — Therapy (Signed)
[facility] [address] [place], [place], [address] Phone: [phone]   Fax:  [phone]  Physical Therapy Treatment  Patient Details  Name: Cheryl [name] MRN: [id number] Date of Birth: 10-14-1955 No Data Recorded  Encounter Date: 11/25/2014      PT End of Session - 11/25/14 1647    Visit Number 6   Number of Visits 12   PT Start Time 1505   PT Stop Time 1605   PT Time Calculation (min) 60 min   Activity Tolerance Patient tolerated treatment well;Patient limited by pain   Behavior During Therapy [facility] for tasks assessed/performed      Past Medical History  Diagnosis Date  . Chest pain   . Grave's disease   . HTN (hypertension)   . HLD (hyperlipidemia)   . Obesity   . Chronic dermatitis   . Edema of both legs   . Varicose veins   . Anemia     Past Surgical History  Procedure Laterality Date  . Cholecystectomy    . Skin graft    . Endovenous ablation saphenous vein w/ laser      There were no vitals filed for this visit.  Visit Diagnosis:  Knee stiffness, left  Knee stiff, right  Difficulty walking due to knee joint  Impaired transfers      Subjective Assessment - 11/25/14 1510    Subjective E Stim helps 7/10 with standing.  Walking helps pain.  Worse going up down steps, getting in and out of the car ittitating needs to use her hands to move leg.    Currently in Pain? Yes   Pain Score 7    Pain Location Leg   Pain Orientation Left;Right                         [facility] Adult PT Treatment/Exercise - 11/25/14 1505    Knee/Hip Exercises: Aerobic   Nustep L6 X 8 minutes   Knee/Hip Exercises: Standing   Other Standing Knee Exercises decreased weight shift LT.   Other Standing Knee Exercises sit to stand 3 reps, cues.   Knee/Hip Exercises: Supine   Bridges Limitations 1 X checked ASIS RT higher, Hard to assess due to adipose tissue and tight clothing.  RT leg slightly longer    Other  Supine Knee/Hip Exercises ball squeeze  in hooklying 10 X, hip abduction isometric 10 X 5 seconds    Other Supine Knee/Hip Exercises decompression, 2 pillows under head, 1 under arms.  Leg lengthener  , tried leg press increased pain, for gentle stretch and str   Knee/Hip Exercises: Sidelying   Clams 5  one side only, cues   Moist Heat Therapy   Number Minutes Moist Heat 15 Minutes   Moist Heat Location Knee  both   Electrical Stimulation   Electrical Stimulation Location both   Electrical Stimulation Action IFC   Electrical Stimulation Parameters to tolerance   Electrical Stimulation Goals Pain                  PT Short Term Goals - 11/25/14 1649    PT SHORT TERM GOAL #1   Title Pt will be I with HEP for LEs   Time 4   Period Weeks   Status Achieved   PT SHORT TERM GOAL #2   Title Pt will report min improvement in ability to perform car transfers, sit to stand, toilet transfers.    Baseline still needs  to use hands   Time 4   Period Weeks   Status On-going   PT SHORT TERM GOAL #3   Title Pt will be I with use of RICE, MHP and body mechanics for self care/pain mgmt   Time 4   Period Weeks   Status On-going   PT SHORT TERM GOAL #4   Title Pt will complete balance screen and set goal if appropriate   Time 4   Period Weeks   Status Unable to assess           PT Long Term Goals - 11/08/14 1525    PT LONG TERM GOAL #1   Title Pt will be I with more advanced HEP for LEs   Time 8   Period Weeks   Status New   PT LONG TERM GOAL #2   Title Pt will score 55% or less impaired on FOTO to demo increased functional mobility.    Time 8   Period Weeks   Status New   PT LONG TERM GOAL #3   Title Pt will be able to walk for 30 min as desired with no more than mod pain in LEs.    Time 8   Period Weeks   Status New   PT LONG TERM GOAL #4   Title Pt will be able to do yardwork with less rest breaks and pain min overall in LEs.    Time 8   Period Weeks   Status  New   PT LONG TERM GOAL #5   Title Pt will be able to perform car transfers with min occasional difficulty   Time 8   Period Weeks   Status New               Plan - 11/25/14 1647    Clinical Impression Statement Focus on exercise that would be benificial and not increase her radiating pain.  E- Stim helpful.   PT Next Visit Plan cont with strengthening , stretching, pain relief.  Assess SI/ leg length discrepancy.  Try to get on consistent team schedule (per [facility] request).    Consulted and Agree with Plan of Care Patient        Problem List Patient Active Problem List   Diagnosis Date Noted  . 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 Leg edema 09/02/2012  . Varicose veins of lower extremities with other complications 09/02/2012  . Pericarditis 10/05/2010  . Chest pain 06/15/2010  . Edema 06/15/2010  . Thyroid disease 06/15/2010    [facility] 11/25/2014, 4:50 PM  [facility] [address] [place], [place], [address] Phone: [phone]   Fax:  [phone]  Name: Cheryl [name] MRN: [id number] Date of Birth: [birth year]    Liz Beach, PTA 11/25/2014 4:50 PM Phone: [phone] Fax: [phone]

## 2014-11-29 ENCOUNTER — Ambulatory Visit: Payer: Commercial Indemnity | Admitting: Physical Therapy

## 2014-11-29 DIAGNOSIS — R2991 Unspecified symptoms and signs involving the musculoskeletal system: Secondary | ICD-10-CM

## 2014-11-29 DIAGNOSIS — M25661 Stiffness of right knee, not elsewhere classified: Secondary | ICD-10-CM

## 2014-11-29 DIAGNOSIS — Z7409 Other reduced mobility: Secondary | ICD-10-CM

## 2014-11-29 DIAGNOSIS — M25662 Stiffness of left knee, not elsewhere classified: Secondary | ICD-10-CM

## 2014-11-29 DIAGNOSIS — R262 Difficulty in walking, not elsewhere classified: Secondary | ICD-10-CM

## 2014-11-29 NOTE — Therapy (Signed)
[facility] [address] [place], [place], [address] Phone: [phone]   Fax:  [phone]  Physical Therapy Treatment  Patient Details  Name: Cheryl BachelorBrenda [name] MRN: [id number] Date of Birth: October 02, 1955 No Data Recorded  Encounter Date: 11/29/2014      PT End of Session - 11/29/14 1701    Visit Number 7   Number of Visits 12   Date for PT Re-Evaluation 01/03/15   PT Start Time 1547   PT Stop Time 1640   PT Time Calculation (min) 53 min   Activity Tolerance Patient tolerated treatment well;No increased pain   Behavior During Therapy [facility] for tasks assessed/performed      Past Medical History  Diagnosis Date  . Chest pain   . Grave's disease   . HTN (hypertension)   . HLD (hyperlipidemia)   . Obesity   . Chronic dermatitis   . Edema of both legs   . Varicose veins   . Anemia     Past Surgical History  Procedure Laterality Date  . Cholecystectomy    . Skin graft    . Endovenous ablation saphenous vein w/ laser      There were no vitals filed for this visit.  Visit Diagnosis:  Knee stiffness, left  Knee stiff, right  Difficulty walking due to knee joint  Impaired transfers      Subjective Assessment - 11/29/14 1549    Subjective (p) Able to go to the fair in [place].  She was able to rest as needed.  6 at rest, 7/10 walking.  Everything  PT does helps.  Worse with standing .     Currently in Pain? (p) Yes   Pain Score (p) 6    Pain Location (p) Knee   Pain Orientation (p) Right;Left   Pain Descriptors / Indicators (p) Aching  nerve pinceLt   Aggravating Factors  (p) walking, longer standing , standing from sitting    better with rest, e-stim.                     [facility] Treatment/Exercise - 11/29/14 1548    Self-Care   Self-Care --  sitting posture, lumbar.  foot stool   Knee/Hip Exercises: Seated   Long Arc Quad 10 reps   Hamstring Curl 10 reps;Both   Hamstring Limitations  level 1  added to home yellow [name]   Sit to Sand --  3 X guarded   Programme researcher, broadcasting/film/videolectrical Stimulation   Electrical Stimulation Location both   Electrical Stimulation Action IFC   Electrical Stimulation Parameters to tolerance   Electrical Stimulation Goals Pain   Manual Therapy   Kinesiotex Inhibit Muscle;Facilitate Muscle   Kinesiotix   Inhibit Muscle  lower leg  Both   Facilitate Muscle  quads both                PT Education - 11/29/14 1659    Education provided Yes   Education Details knee [name].  Sitting posture   Person(s) Educated Patient   Methods Explanation;Demonstration;Verbal cues;Handout   Comprehension Verbalized understanding;Returned demonstration          PT Short Term Goals - 11/29/14 1703    PT SHORT TERM GOAL #1   Title Pt will be I with HEP for LEs   Status Achieved   PT SHORT TERM GOAL #2   Title Pt will report min improvement in ability to perform car transfers, sit to stand, toilet transfers.  Time 4   Period Weeks   Status On-going   PT SHORT TERM GOAL #3   Title Pt will be I with use of RICE, MHP and body mechanics for self care/pain mgmt   Time 4   Period Weeks   Status On-going   PT SHORT TERM GOAL #4   Title Pt will complete balance screen and set goal if appropriate   Time 4   Period Weeks   Status Unable to assess           PT Long Term Goals - 11/08/14 1525    PT LONG TERM GOAL #1   Title Pt will be I with more advanced HEP for LEs   Time 8   Period Weeks   Status New   PT LONG TERM GOAL #2   Title Pt will score 55% or less impaired on FOTO to demo increased functional mobility.    Time 8   Period Weeks   Status New   PT LONG TERM GOAL #3   Title Pt will be able to walk for 30 min as desired with no more than mod pain in LEs.    Time 8   Period Weeks   Status New   PT LONG TERM GOAL #4   Title Pt will be able to do yardwork with less rest breaks and pain min overall in LEs.    Time 8   Period Weeks   Status New    PT LONG TERM GOAL #5   Title Pt will be able to perform car transfers with min occasional difficulty   Time 8   Period Weeks   Status New               Plan - 11/29/14 1701    PT Next Visit Plan assess taping.  continue strengthening.  Patient does not like to exercise in gym with shorts .  try 4' step ups.   PT Home Exercise Plan hamstring [name]   Consulted and Agree with Plan of Care Patient        Problem List Patient Active Problem List   Diagnosis Date Noted  . 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 Leg edema 09/02/2012  . Varicose veins of lower extremities with other complications 09/02/2012  . Pericarditis 10/05/2010  . Chest pain 06/15/2010  . Edema 06/15/2010  . Thyroid disease 06/15/2010    [facility] 11/29/2014, 5:04 PM  [facility] [address] [place], [place], [address] Phone: [phone]   Fax:  [phone]  Name: Cheryl [name] MRN: [id number] Date of Birth: 09-12-1955    Liz Beach, PTA 11/29/2014 5:04 PM Phone: [phone] Fax: [phone]

## 2014-11-29 NOTE — Patient Instructions (Signed)
Band hamstrings added from drawer.  10 - 20X 1 X a day.  Stop if painful. Remove tape if irritating.

## 2014-12-01 ENCOUNTER — Ambulatory Visit: Payer: Commercial Indemnity | Admitting: Physical Therapy

## 2014-12-01 DIAGNOSIS — R262 Difficulty in walking, not elsewhere classified: Secondary | ICD-10-CM

## 2014-12-01 DIAGNOSIS — Z7409 Other reduced mobility: Secondary | ICD-10-CM

## 2014-12-01 DIAGNOSIS — M25661 Stiffness of right knee, not elsewhere classified: Secondary | ICD-10-CM

## 2014-12-01 DIAGNOSIS — M25662 Stiffness of left knee, not elsewhere classified: Secondary | ICD-10-CM

## 2014-12-01 DIAGNOSIS — R2991 Unspecified symptoms and signs involving the musculoskeletal system: Secondary | ICD-10-CM

## 2014-12-01 NOTE — Therapy (Signed)
[facility] [address] [place], [place], [address] Phone: [phone]   Fax:  [phone]  Physical Therapy Treatment  Patient Details  Name: Cheryl [name] MRN: [id number] Date of Birth: [birth year] No Data Recorded  Encounter Date: 12/01/2014      PT End of Session - 12/01/14 1700    Visit Number 8   Number of Visits 12   Date for PT Re-Evaluation 01/03/15   PT Start Time 1549   PT Stop Time 1700   PT Time Calculation (min) 71 min   Activity Tolerance Patient tolerated treatment well;Patient limited by pain   Behavior During Therapy [facility] for tasks assessed/performed      Past Medical History  Diagnosis Date  . Chest pain   . Grave's disease   . HTN (hypertension)   . HLD (hyperlipidemia)   . Obesity   . Chronic dermatitis   . Edema of both legs   . Varicose veins   . Anemia     Past Surgical History  Procedure Laterality Date  . Cholecystectomy    . Skin graft    . Endovenous ablation saphenous vein w/ laser      There were no vitals filed for this visit.  Visit Diagnosis:  Knee stiffness, left  Knee stiff, right  Difficulty walking due to knee joint  Impaired transfers      Subjective Assessment - 12/01/14 1600    Subjective Not sure if tape was helpful.  Doing her exercises no questions,   It is easier to sleep.     Limitations Sitting;Lifting;Standing;Walking;House hold activities;Other (comment)   Currently in Pain? Yes   Pain Score 7    Pain Location Knee   Pain Orientation Right;Left   Pain Descriptors / Indicators Sharp   Pain Radiating Towards back of knees   Pain Frequency Constant   Aggravating Factors  standing   Pain Relieving Factors rest elevation.                         [facility] Adult PT Treatment/Exercise - 12/01/14 1610    Knee/Hip Exercises: Stretches   Piriformis Stretch Limitations strap used.  added to home program.  decreased posterior leg pain.     triesd sitting with foot stool and supine difficult reach   Knee/Hip Exercises: Aerobic   Nustep L6 7 minutes   Knee/Hip Exercises: Standing   Heel Raises 10 reps  posterior leg pain  7/10 both 10 X   Forward Step Up Both;1 set;10 reps;Hand Hold: 2;Step Height: 4"   Wall Squat 10 reps  cues, posterior thigh pain   Knee/Hip Exercises: Seated   Heel Slides Limitations 2 sets 10 X yellow band flexion.   Knee/Hip Exercises: Supine   Bridges Limitations 10 X cues   Knee Flexion Limitations knee flexion with hip flexion 10 X cues for neutral spine.     Knee/Hip Exercises: Sidelying   Clams 2 sets 10 RT, LT  10 X2     Moist Heat Therapy   Number Minutes Moist Heat 15 Minutes   Moist Heat Location Knee  both   Electrical Stimulation   Electrical Stimulation Location both   Electrical Stimulation Action IFC   Electrical Stimulation Parameters to tolerance (17)   Electrical Stimulation Goals Pain   Manual Therapy   Manual Therapy --  foam roller to hamstrings distally, non tender   Kinesiotex --  trimmed loose piece, patient wanted it to remain  PT Education - 12/01/14 1659    Education provided Yes   Education Details clamshell, piriformis   Person(s) Educated Patient   Methods Explanation;Demonstration;Tactile cues;Verbal cues;Handout   Comprehension Verbalized understanding;Returned demonstration          PT Short Term Goals - 11/29/14 1703    PT SHORT TERM GOAL #1   Title Pt will be I with HEP for LEs   Status Achieved   PT SHORT TERM GOAL #2   Title Pt will report min improvement in ability to perform car transfers, sit to stand, toilet transfers.    Time 4   Period Weeks   Status On-going   PT SHORT TERM GOAL #3   Title Pt will be I with use of RICE, MHP and body mechanics for self care/pain mgmt   Time 4   Period Weeks   Status On-going   PT SHORT TERM GOAL #4   Title Pt will complete balance screen and set goal if appropriate   Time 4    Period Weeks   Status Unable to assess           PT Long Term Goals - 11/08/14 1525    PT LONG TERM GOAL #1   Title Pt will be I with more advanced HEP for LEs   Time 8   Period Weeks   Status New   PT LONG TERM GOAL #2   Title Pt will score 55% or less impaired on FOTO to demo increased functional mobility.    Time 8   Period Weeks   Status New   PT LONG TERM GOAL #3   Title Pt will be able to walk for 30 min as desired with no more than mod pain in LEs.    Time 8   Period Weeks   Status New   PT LONG TERM GOAL #4   Title Pt will be able to do yardwork with less rest breaks and pain min overall in LEs.    Time 8   Period Weeks   Status New   PT LONG TERM GOAL #5   Title Pt will be able to perform car transfers with min occasional difficulty   Time 8   Period Weeks   Status New               Plan - 12/01/14 1701    Clinical Impression Statement walks worse post exercise today prior to modalities.  Pain down back of leg intermittant and may not be related to knees.  IT is limiting progress toward goals.     PT Next Visit Plan wall sits, step ups, sit to stand from high, clams.     PT Home Exercise Plan clams , piriformis   Consulted and Agree with Plan of Care Patient        Problem List Patient Active Problem List   Diagnosis Date Noted  . 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 Leg edema 09/02/2012  . Varicose veins of lower extremities with other complications 09/02/2012  . Pericarditis 10/05/2010  . Chest pain 06/15/2010  . Edema 06/15/2010  . Thyroid disease 06/15/2010    HARRIS,KAREN 12/01/2014, 5:06 PM  [facility] [address] [place], [place], [address] Phone: [phone]   Fax:  [phone]  Name: Cheryl [name] MRN: [id number] Date of Birth: 04-25-1955    Liz BeachKaren Harris, PTA 12/01/2014 5:06 PM Phone: [phone] Fax: [phone]

## 2014-12-01 NOTE — Patient Instructions (Signed)
Abduction: Clam (Eccentric) - Side-Lying    Lie on side with knees bent. Lift top knee, keeping feet together. Keep trunk steady. Slowly lower for 3-5 seconds. __10_ reps per set, ___ sets per day, ___ days per week.    Piriformis Stretch - Supine    Pull uninvolved knee across body toward opposite shoulder. Hold slight stretch for 20-30___ seconds. Repeat with involved leg. Repeat __3_ times. Do __1_ times per day.  Use sheet around leg.  LT and rt if pain free.   May try sitting.    Copyright  VHI. All rights reserved.    http://ecce.exer.us/65   Copyright  VHI. All rights reserved.

## 2014-12-06 ENCOUNTER — Ambulatory Visit: Payer: Commercial Indemnity | Admitting: Physical Therapy

## 2014-12-06 DIAGNOSIS — Z7409 Other reduced mobility: Secondary | ICD-10-CM

## 2014-12-06 DIAGNOSIS — M25662 Stiffness of left knee, not elsewhere classified: Secondary | ICD-10-CM

## 2014-12-06 DIAGNOSIS — R262 Difficulty in walking, not elsewhere classified: Secondary | ICD-10-CM

## 2014-12-06 DIAGNOSIS — M25661 Stiffness of right knee, not elsewhere classified: Secondary | ICD-10-CM

## 2014-12-06 DIAGNOSIS — R2991 Unspecified symptoms and signs involving the musculoskeletal system: Secondary | ICD-10-CM

## 2014-12-06 NOTE — Therapy (Signed)
[facility] [address] [place], [place], [address] Phone: [phone]   Fax:  [phone]  Physical Therapy Treatment  Patient Details  Name: Cheryl BachelorBrenda [name] MRN: [id number] Date of Birth: 1955-05-18 No Data Recorded  Encounter Date: 12/06/2014      Cheryl [name] End of Session - 12/06/14 1558    Visit Number 9   Number of Visits 12   Date for Cheryl [name] Re-Evaluation 01/03/15   Cheryl [name] Start Time 1515   Cheryl [name] Stop Time 1610   Cheryl [name] Time Calculation (min) 55 min   Activity Tolerance Patient tolerated treatment well;Patient limited by pain   Behavior During Therapy [facility] for tasks assessed/performed      Past Medical History  Diagnosis Date  . Chest pain   . Grave's disease   . HTN (hypertension)   . HLD (hyperlipidemia)   . Obesity   . Chronic dermatitis   . Edema of both legs   . Varicose veins   . Anemia     Past Surgical History  Procedure Laterality Date  . Cholecystectomy    . Skin graft    . Endovenous ablation saphenous vein w/ laser      There were no vitals filed for this visit.  Visit Diagnosis:  Knee stiffness, left  Knee stiff, right  Difficulty walking due to knee joint  Impaired transfers      Subjective Assessment - 12/06/14 1518    Subjective Had a rough weekend; in a lot of pain.  Knees hurt today from working all day.   Pertinent History HTN, Varicose veins, edema, thyroid   Limitations Sitting;Lifting;Standing;Walking;House hold activities;Other (comment)   Diagnostic tests MRI 2015 with medial meniscus tear, tricompartmental OA,  XR, bone scan    Patient Stated Goals pain relief for improved standing, walking and work   Currently in Pain? Yes   Pain Score 8    Pain Location Knee   Pain Orientation Left;Right   Pain Descriptors / Indicators Sharp   Pain Type Chronic pain   Pain Radiating Towards back of knees   Pain Onset More than a month ago   Pain Frequency Constant   Aggravating Factors  standing   Pain Relieving Factors rest, elevation                         [facility] Adult Cheryl [name] Treatment/Exercise - 12/06/14 1520    Knee/Hip Exercises: Stretches   Active Hamstring Stretch 3 reps;30 seconds   Active Hamstring Stretch Limitations seated; bil   Knee/Hip Exercises: Aerobic   Nustep L5 x 8 min   Knee/Hip Exercises: Standing   Heel Raises 10 reps   Hip Flexion 10 reps;Both;Knee bent   Hip Abduction Both;10 reps   Hip Extension Both;10 reps   Wall Squat 10 reps   Knee/Hip Exercises: Supine   Other Supine Knee/Hip Exercises hamstring curls on physioball 2x10; bridging on physioball x10   Modalities   Modalities Electrical Stimulation;Moist Heat   Moist Heat Therapy   Number Minutes Moist Heat 15 Minutes   Moist Heat Location Knee  posterior bil   Electrical Stimulation   Electrical Stimulation Location bil knees (ant to post)   Statisticianlectrical Stimulation Action premod   Electrical Stimulation Parameters to tolerance (23)   Electrical Stimulation Goals Pain                  Cheryl [name] Short Term Goals - 11/29/14 1703    Cheryl [name] SHORT TERM GOAL #1  Title Cheryl [name] will be I with HEP for LEs   Status Achieved   Cheryl [name] SHORT TERM GOAL #2   Title Cheryl [name] will report min improvement in ability to perform car transfers, sit to stand, toilet transfers.    Time 4   Period Weeks   Status On-going   Cheryl [name] SHORT TERM GOAL #3   Title Cheryl [name] will be I with use of RICE, MHP and body mechanics for self care/pain mgmt   Time 4   Period Weeks   Status On-going   Cheryl [name] SHORT TERM GOAL #4   Title Cheryl [name] will complete balance screen and set goal if appropriate   Time 4   Period Weeks   Status Unable to assess           Cheryl [name] Long Term Goals - 11/08/14 1525    Cheryl [name] LONG TERM GOAL #1   Title Cheryl [name] will be I with more advanced HEP for LEs   Time 8   Period Weeks   Status New   Cheryl [name] LONG TERM GOAL #2   Title Cheryl [name] will score 55% or less impaired on FOTO to demo increased functional mobility.    Time 8   Period  Weeks   Status New   Cheryl [name] LONG TERM GOAL #3   Title Cheryl [name] will be able to walk for 30 min as desired with no more than mod pain in LEs.    Time 8   Period Weeks   Status New   Cheryl [name] LONG TERM GOAL #4   Title Cheryl [name] will be able to do yardwork with less rest breaks and pain min overall in LEs.    Time 8   Period Weeks   Status New   Cheryl [name] LONG TERM GOAL #5   Title Cheryl [name] will be able to perform car transfers with min occasional difficulty   Time 8   Period Weeks   Status New               Plan - 12/06/14 1558    Clinical Impression Statement Cheryl [name] tolerated standing exercises well today without c/o increased pain.  Cheryl [name] cont to report pain near 7-8/10.     Cheryl [name] Next Visit Plan wall sits, step ups, sit to stand from high, clams.  weight bearing activities as tolerated        Problem List Patient Active Problem List   Diagnosis Date Noted  . 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 Leg edema 09/02/2012  . Varicose veins of lower extremities with other complications 09/02/2012  . Pericarditis 10/05/2010  . Chest pain 06/15/2010  . Edema 06/15/2010  . Thyroid disease 06/15/2010   Cheryl [name], Cheryl [name], Cheryl [name] 12/06/2014 4:13 PM  [facility] [address] [place], [place], [address] Phone: [phone]   Fax:  [phone]  Name: Cheryl [name] MRN: [id number] Date of Birth: 05-31-1955

## 2014-12-08 ENCOUNTER — Ambulatory Visit: Payer: Commercial Indemnity | Attending: Family Medicine | Admitting: Physical Therapy

## 2014-12-08 DIAGNOSIS — M25662 Stiffness of left knee, not elsewhere classified: Secondary | ICD-10-CM | POA: Diagnosis present

## 2014-12-08 DIAGNOSIS — R2991 Unspecified symptoms and signs involving the musculoskeletal system: Secondary | ICD-10-CM | POA: Diagnosis present

## 2014-12-08 DIAGNOSIS — R262 Difficulty in walking, not elsewhere classified: Secondary | ICD-10-CM | POA: Insufficient documentation

## 2014-12-08 DIAGNOSIS — Z7409 Other reduced mobility: Secondary | ICD-10-CM

## 2014-12-08 DIAGNOSIS — M25661 Stiffness of right knee, not elsewhere classified: Secondary | ICD-10-CM | POA: Diagnosis present

## 2014-12-08 NOTE — Therapy (Signed)
[facility] [address] [place], [place], [address] Phone: [phone]   Fax:  [phone]  Physical Therapy Treatment  Patient Details  Name: Cheryl [name] MRN: [id number] Date of Birth: [birth year] No Data Recorded  Encounter Date: 12/08/2014      PT End of Session - 12/08/14 1557    Visit Number 10   Number of Visits 12   Date for PT Re-Evaluation 01/03/15   PT Start Time 1515   PT Stop Time 1607   PT Time Calculation (min) 52 min   Activity Tolerance Patient tolerated treatment well;Patient limited by pain   Behavior During Therapy [facility] for tasks assessed/performed      Past Medical History  Diagnosis Date  . Chest pain   . Grave's disease   . HTN (hypertension)   . HLD (hyperlipidemia)   . Obesity   . Chronic dermatitis   . Edema of both legs   . Varicose veins   . Anemia     Past Surgical History  Procedure Laterality Date  . Cholecystectomy    . Skin graft    . Endovenous ablation saphenous vein w/ laser      There were no vitals filed for this visit.  Visit Diagnosis:  Knee stiffness, left  Knee stiff, right  Difficulty walking due to knee joint  Impaired transfers      Subjective Assessment - 12/08/14 1520    Subjective Knees feel worse today and have felt worse since last session (did more standing exercises last session)   Pertinent History HTN, Varicose veins, edema, thyroid   Limitations Sitting;Lifting;Standing;Walking;House hold activities;Other (comment)   Currently in Pain? Yes   Pain Score 8   "a high 8"   Pain Location Knee   Pain Orientation Left;Right   Pain Descriptors / Indicators Sharp   Pain Type Chronic pain   Pain Onset More than a month ago   Pain Frequency Constant   Aggravating Factors  standing   Pain Relieving Factors rest, elevation            [facility] PT Assessment - 12/08/14 1631    Observation/Other Assessments   Focus on Therapeutic Outcomes (FOTO)  30  (70% limited); goal 52%                     [facility] Adult PT Treatment/Exercise - 12/08/14 1521    Lumbar Exercises: Stretches   Lower Trunk Rotation 3 reps;30 seconds   Lower Trunk Rotation Limitations bil   Knee/Hip Exercises: Aerobic   Nustep L5 x 8 min   Knee/Hip Exercises: Seated   Long Arc Quad 10 reps;2 sets   [employer] Weight 4 lbs.   Marching Limitations 2x10 4# bil   Marching Weights 4 lbs.   Moist Heat Therapy   Number Minutes Moist Heat 15 Minutes   Moist Heat Location Knee   Electrical Stimulation   Electrical Stimulation Location bil knees (ant to post)   Electrical Stimulation Action premod   Electrical Stimulation Parameters to tolerance   Electrical Stimulation Goals Pain   Manual Therapy   Manual Therapy Soft tissue mobilization   Soft tissue mobilization L hamstring trigger point release and manual stretching                  PT Short Term Goals - 11/29/14 1703    PT SHORT TERM GOAL #1   Title Pt will be I with HEP for LEs   Status Achieved  PT SHORT TERM GOAL #2   Title Pt will report min improvement in ability to perform car transfers, sit to stand, toilet transfers.    Time 4   Period Weeks   Status On-going   PT SHORT TERM GOAL #3   Title Pt will be I with use of RICE, MHP and body mechanics for self care/pain mgmt   Time 4   Period Weeks   Status On-going   PT SHORT TERM GOAL #4   Title Pt will complete balance screen and set goal if appropriate   Time 4   Period Weeks   Status Unable to assess           PT Long Term Goals - 11/08/14 1525    PT LONG TERM GOAL #1   Title Pt will be I with more advanced HEP for LEs   Time 8   Period Weeks   Status New   PT LONG TERM GOAL #2   Title Pt will score 55% or less impaired on FOTO to demo increased functional mobility.    Time 8   Period Weeks   Status New   PT LONG TERM GOAL #3   Title Pt will be able to walk for 30 min as desired with no more than mod pain in  LEs.    Time 8   Period Weeks   Status New   PT LONG TERM GOAL #4   Title Pt will be able to do yardwork with less rest breaks and pain min overall in LEs.    Time 8   Period Weeks   Status New   PT LONG TERM GOAL #5   Title Pt will be able to perform car transfers with min occasional difficulty   Time 8   Period Weeks   Status New               Plan - 12/08/14 1557    Clinical Impression Statement Increased pain after leaving last session therefore decreased weight bearing exercises today.  FOTO score only improved by 2%.   PT Next Visit Plan wall sits, step ups, sit to stand from high, clams.  weight bearing activities as tolerated   PT Home Exercise Plan clams , piriformis   Consulted and Agree with Plan of Care Patient        Problem List Patient Active Problem List   Diagnosis Date Noted  . 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 Leg edema 09/02/2012  . Varicose veins of lower extremities with other complications 09/02/2012  . Pericarditis 10/05/2010  . Chest pain 06/15/2010  . Edema 06/15/2010  . Thyroid disease 06/15/2010   Clarita CraneStephanie F Lorel Lembo, PT, DPT 12/08/2014 4:33 PM  [name] [address] [place], [place], [address] Phone: [phone]   Fax:  [phone]  Name: Keenan BachelorBrenda Tally MRN: [id number] Date of Birth: 02-21-1955

## 2014-12-13 ENCOUNTER — Ambulatory Visit: Payer: Commercial Indemnity | Admitting: Physical Therapy

## 2014-12-13 DIAGNOSIS — R2991 Unspecified symptoms and signs involving the musculoskeletal system: Secondary | ICD-10-CM

## 2014-12-13 DIAGNOSIS — M25662 Stiffness of left knee, not elsewhere classified: Secondary | ICD-10-CM | POA: Diagnosis not present

## 2014-12-13 DIAGNOSIS — R262 Difficulty in walking, not elsewhere classified: Secondary | ICD-10-CM

## 2014-12-13 DIAGNOSIS — M25661 Stiffness of right knee, not elsewhere classified: Secondary | ICD-10-CM

## 2014-12-13 DIAGNOSIS — Z7409 Other reduced mobility: Secondary | ICD-10-CM

## 2014-12-13 NOTE — Therapy (Signed)
[facility] [address] [place], [place], [address] Phone: [phone]   Fax:  [phone]  Physical Therapy Treatment  Patient Details  Name: Cheryl BachelorBrenda Servidio MRN: [id number] Date of Birth: 07-25-1955 No Data Recorded  Encounter Date: 12/13/2014      PT End of Session - 12/13/14 1559    Visit Number 11   Number of Visits 17   Date for PT Re-Evaluation 01/03/15   PT Start Time 1515   PT Stop Time 1611   PT Time Calculation (min) 56 min   Activity Tolerance Patient tolerated treatment well;Patient limited by pain   Behavior [facility] for [facility] assessed/performed      Past Medical History  Diagnosis Date  . Chest pain   . Grave's disease   . HTN (hypertension)   . HLD (hyperlipidemia)   . Obesity   . Chronic dermatitis   . Edema of both legs   . Varicose veins   . Anemia     Past Surgical History  Procedure Laterality Date  . Cholecystectomy    . Skin graft    . Endovenous ablation saphenous vein w/ laser      There were no vitals filed for this visit.  Visit Diagnosis:  Knee stiffness, left  Knee stiff, right  Difficulty walking due to knee joint  Impaired transfers      Subjective Assessment - 12/13/14 1518    Subjective Had a really good day after last session, called office to thank therapist.  Then walked a cemetary and pt felt pain return.  Pain has returned in back of legs   Currently in Pain? Yes   Pain Score 6   "it's not too bad"   Pain Location Knee   Pain Orientation Right;Left   Pain Descriptors / Indicators Sharp   Pain Type Chronic pain   Pain Onset More than a month ago   Pain Frequency Constant   Aggravating Factors  standing, walking   Pain Relieving Factors rest, elevation                         [facility] Adult PT Treatment/Exercise - 12/13/14 1521    Lumbar Exercises: Stretches   Passive Hamstring Stretch --   Passive Hamstring Stretch Limitations --   Knee/Hip Exercises: Stretches   Active Hamstring Stretch 3 reps;30 seconds   Active Hamstring Stretch Limitations supine with strap; including calf stretch   Gastroc Stretch 3 reps;30 seconds   Other Knee/Hip Stretches ITB stretch 3x30 sec with strap   Knee/Hip Exercises: Aerobic   Nustep L5 x 8 min   Modalities   Modalities Electrical Stimulation;Moist Heat   Moist Heat Therapy   Number Minutes Moist Heat 15 Minutes   Moist Heat Location Knee   Electrical Stimulation   Electrical Stimulation Location bil knees (ant to post)   Statisticianlectrical Stimulation Action premod   Electrical Stimulation Parameters to tolerance   Electrical Stimulation Goals Pain                PT Education - 12/13/14 1559    Education provided Yes   Education Details stretching exercises   Person(s) Educated Patient   Methods Explanation;Demonstration;Handout   Comprehension Verbalized understanding;Returned demonstration;Need further instruction          PT Short Term Goals - 11/29/14 1703    PT SHORT TERM GOAL #1   Title Pt will be I with HEP for LEs   Status  Achieved   PT SHORT TERM GOAL #2   Title Pt will report min improvement in ability to perform car transfers, sit to stand, toilet transfers.    Time 4   Period Weeks   Status On-going   PT SHORT TERM GOAL #3   Title Pt will be I with use of RICE, MHP and body mechanics for self care/pain mgmt   Time 4   Period Weeks   Status On-going   PT SHORT TERM GOAL #4   Title Pt will complete balance screen and set goal if appropriate   Time 4   Period Weeks   Status Unable to assess           PT Long Term Goals - 11/08/14 1525    PT LONG TERM GOAL #1   Title Pt will be I with more advanced HEP for LEs   Time 8   Period Weeks   Status New   PT LONG TERM GOAL #2   Title Pt will score 55% or less impaired on FOTO to demo increased functional mobility.    Time 8   Period Weeks   Status New   PT LONG TERM GOAL #3   Title Pt will be  able to walk for 30 min as desired with no more than mod pain in LEs.    Time 8   Period Weeks   Status New   PT LONG TERM GOAL #4   Title Pt will be able to do yardwork with less rest breaks and pain min overall in LEs.    Time 8   Period Weeks   Status New   PT LONG TERM GOAL #5   Title Pt will be able to perform car transfers with min occasional difficulty   Time 8   Period Weeks   Status New               Plan - 12/13/14 1559    Clinical Impression Statement Pt needs increased time with stretching exercises.  Provided stretches to HEP as pt with significantly decreased pain after last session which was mostly focused on stretching.  Slowly making progress towards goals, but relief tends to be short-lived.   PT Next Visit Plan wall sits, step ups, sit to stand from high, clams.  weight bearing activities as tolerated   PT Home Exercise Plan clams , piriformis; hamstring, calf and itb stretches   Consulted and Agree with Plan of Care Patient        Problem List Patient Active Problem List   Diagnosis Date Noted  . 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 Leg edema 09/02/2012  . Varicose veins of lower extremities with other complications 09/02/2012  . Pericarditis 10/05/2010  . Chest pain 06/15/2010  . Edema 06/15/2010  . Thyroid disease 06/15/2010   Clarita Crane, PT, DPT 12/13/2014 4:11 PM  [facility] [address] [place], [place], [address] Phone: [phone]   Fax:  [phone]  Name: Cheryl [name] MRN: [id number] Date of Birth: 05/11/1955

## 2014-12-13 NOTE — Patient Instructions (Signed)
Hamstring Step 1    Straighten left knee. Hold _30__ seconds. Relax knee by returning foot to start. Repeat _3__ times.  Repeat with other leg.  Copyright  VHI. All rights reserved.   Outer Hip Stretch: Reclined IT Band Stretch (Strap)    Strap around opposite foot, pull across only as far as possible with shoulders on mat. Hold for _30___ seconds. Repeat __3__ times each leg.  Copyright  VHI. All rights reserved.    Achilles / Gastroc, Standing    Stand, right foot behind, heel on floor and turned slightly out, leg straight, forward leg bent. Move hips forward. Hold _30__ seconds. Repeat with other leg. Repeat _3__ times per session. Do _1-2__ sessions per day.  Copyright  VHI. All rights reserved.

## 2014-12-27 ENCOUNTER — Ambulatory Visit: Payer: Commercial Indemnity | Admitting: Physical Therapy

## 2014-12-27 DIAGNOSIS — R2991 Unspecified symptoms and signs involving the musculoskeletal system: Secondary | ICD-10-CM

## 2014-12-27 DIAGNOSIS — M25661 Stiffness of right knee, not elsewhere classified: Secondary | ICD-10-CM

## 2014-12-27 DIAGNOSIS — R262 Difficulty in walking, not elsewhere classified: Secondary | ICD-10-CM

## 2014-12-27 DIAGNOSIS — M25662 Stiffness of left knee, not elsewhere classified: Secondary | ICD-10-CM

## 2014-12-27 DIAGNOSIS — Z7409 Other reduced mobility: Secondary | ICD-10-CM

## 2014-12-27 NOTE — Therapy (Signed)
[facility] [address] [place], [place], [address] Phone: [phone]   Fax:  [phone]  Physical Therapy Treatment  Patient Details  Name: Cheryl BachelorBrenda Sainato MRN: [id number] Date of Birth: [birth year] No Data Recorded  Encounter Date: 12/27/2014      PT End of Session - 12/27/14 1304    Visit Number 12   Number of Visits 17   Date for PT Re-Evaluation 01/03/15   PT Start Time 1100   PT Stop Time 1147   PT Time Calculation (min) 47 min   Activity Tolerance Patient tolerated treatment well  reports feeling good after session, denies any increased pain   Behavior During Therapy [facility] for tasks assessed/performed      Past Medical History  Diagnosis Date  . Chest pain   . Grave's disease   . HTN (hypertension)   . HLD (hyperlipidemia)   . Obesity   . Chronic dermatitis   . Edema of both legs   . Varicose veins   . Anemia     Past Surgical History  Procedure Laterality Date  . Cholecystectomy    . Skin graft    . Endovenous ablation saphenous vein w/ laser      There were no vitals filed for this visit.  Visit Diagnosis:  Knee stiffness, left  Knee stiff, right  Difficulty walking due to knee joint  Impaired transfers      Subjective Assessment - 12/27/14 1242    Subjective Can tell that she has been getting better slowly. Feels like she able to do a little more. Stretches seem to be helping, trying to do them regularly at home.    Currently in Pain? No/denies  no pain just sitting here but it will get worse once moving    Aggravating Factors  walking, moving, activity   Pain Relieving Factors rest, stretches   Effect of Pain on Daily Activities hard to walk and be active                         [facility] Treatment/Exercise - 12/27/14 0001    Lumbar Exercises: Stretches   ITB Stretch 3 reps;30 seconds   ITB Stretch Limitations Bilateral, straight leg crossover with sheet assist   Knee/Hip Exercises: Stretches   Active Hamstring Stretch 4 reps;30 seconds;Both   Active Hamstring Stretch Limitations supine with sheet assist with straight leg, modified with bent knee active stretch, sheet assist, optional dorsiflexion as tolerated.    Hip Flexor Stretch Left;2 reps;10 seconds   Hip Flexor Stretch Limitations standing staggered stance position, minimal stretch reported by pt D/C   Knee/Hip Exercises: Seated   Clamshell with TheraBand Blue  1X10   Knee/Hip Flexion Blue band loop, 1X10 bilaterally   Sit to [employer]Sand 1 set;10 reps                PT Education - 12/27/14 1302    Education provided Yes   Education Details stretching and strengthening   Person(s) Educated Patient   Methods Explanation;Demonstration;Handout   Comprehension Returned demonstration;Verbalized understanding          PT Short Term Goals - 11/29/14 1703    PT SHORT TERM GOAL #1   Title Pt will be I with HEP for LEs   Status Achieved   PT SHORT TERM GOAL #2   Title Pt will report min improvement in ability to perform car transfers, sit to stand, toilet transfers.  Time 4   Period Weeks   Status On-going   PT SHORT TERM GOAL #3   Title Pt will be I with use of RICE, MHP and body mechanics for self care/pain mgmt   Time 4   Period Weeks   Status On-going   PT SHORT TERM GOAL #4   Title Pt will complete balance screen and set goal if appropriate   Time 4   Period Weeks   Status Unable to assess           PT Long Term Goals - 11/08/14 1525    PT LONG TERM GOAL #1   Title Pt will be I with more advanced HEP for LEs   Time 8   Period Weeks   Status New   PT LONG TERM GOAL #2   Title Pt will score 55% or less impaired on FOTO to demo increased functional mobility.    Time 8   Period Weeks   Status New   PT LONG TERM GOAL #3   Title Pt will be able to walk for 30 min as desired with no more than mod pain in LEs.    Time 8   Period Weeks   Status New   PT LONG TERM  GOAL #4   Title Pt will be able to do yardwork with less rest breaks and pain min overall in LEs.    Time 8   Period Weeks   Status New   PT LONG TERM GOAL #5   Title Pt will be able to perform car transfers with min occasional difficulty   Time 8   Period Weeks   Status New               Plan - 12/27/14 1306    Clinical Impression Statement Extensive time spent reviewing and modifying HEP with emphasis on patient's stretching program. Able to add sit/stand to HEP as well as hip stabilization for functional knee stability. Patient reporting that she feels good after session and denied any questions or concerns. Anticipate progression of clinic and home based exercies as tolerated.    Pt will benefit from skilled therapeutic intervention in order to improve on the following deficits Postural dysfunction;Difficulty walking;Decreased activity tolerance;Decreased mobility;Impaired flexibility;Decreased range of motion;Decreased balance;Hypomobility;Pain;Increased edema;Increased fascial restricitons   Rehab Potential Good   PT Frequency 2x / week   PT Treatment/Interventions Electrical Stimulation;Iontophoresis /ml Dexamethasone;Moist Heat;Ultrasound;Patient/family education;Taping;Passive range of motion;Balance training;Therapeutic exercise;Cryotherapy;Functional mobility training;Manual techniques;Therapeutic activities   PT Next Visit Plan progress strengthening as tolerated with functional based tasks as able. Review of stretching program as needed.    PT Home Exercise Plan progress with strengtheining program- step ups, wall sits.    Consulted and Agree with Plan of Care Patient        Problem List Patient Active Problem List   Diagnosis Date Noted  . 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 Leg edema 09/02/2012  . Varicose veins of lower extremities with other complications 09/02/2012  . Pericarditis 10/05/2010  . Chest pain 06/15/2010  . Edema 06/15/2010   . Thyroid disease 06/15/2010    Delton See, PT, CSCS 12/27/2014, 1:18 PM  [facility] [address] [place], [place], [address] Phone: [phone]   Fax:  [phone]  Name: Caitlain Tweed MRN: [id number] Date of Birth: 04-15-1955

## 2014-12-27 NOTE — Patient Instructions (Signed)
Seated marching with band loop  10 reps 2Xday  Seated Clams with band  1 set X10 2X day  Sit to Stand 1X10  2 X day  Supine active hamstring stretch (hip flexion to 90 degrees) 3 X 30 second holds 2X day (sheet assist as needed)

## 2015-01-04 ENCOUNTER — Ambulatory Visit: Payer: Commercial Indemnity | Admitting: Physical Therapy

## 2015-01-04 DIAGNOSIS — Z7409 Other reduced mobility: Secondary | ICD-10-CM

## 2015-01-04 DIAGNOSIS — M25662 Stiffness of left knee, not elsewhere classified: Secondary | ICD-10-CM | POA: Diagnosis not present

## 2015-01-04 DIAGNOSIS — R2991 Unspecified symptoms and signs involving the musculoskeletal system: Secondary | ICD-10-CM

## 2015-01-04 DIAGNOSIS — R262 Difficulty in walking, not elsewhere classified: Secondary | ICD-10-CM

## 2015-01-04 DIAGNOSIS — M25661 Stiffness of right knee, not elsewhere classified: Secondary | ICD-10-CM

## 2015-01-04 NOTE — Therapy (Signed)
Cheryl [name], [name], [address] Phone: [phone]   Fax:  [phone]  Physical Therapy Treatment  Patient Details  Name: Cheryl [name] MRN: [id number] Date of Birth: November 12, 1955 No Data Recorded  Encounter Date: 01/04/2015      PT End of Session - 01/04/15 1803    Visit Number 13   Number of Visits 17   Date for PT Re-Evaluation 01/03/15   PT Start Time 1550   PT Stop Time 1630   PT Time Calculation (min) 40 min   Activity Tolerance Patient tolerated treatment well;No increased pain   Behavior During Therapy [facility] for tasks assessed/performed      Past Medical History  Diagnosis Date  . Chest pain   . Grave's disease   . HTN (hypertension)   . HLD (hyperlipidemia)   . Obesity   . Chronic dermatitis   . Edema of both legs   . Varicose veins   . Anemia     Past Surgical History  Procedure Laterality Date  . Cholecystectomy    . Skin graft    . Endovenous ablation saphenous vein w/ laser      There were no vitals filed for this visit.  Visit Diagnosis:  Knee stiffness, left  Knee stiff, right  Difficulty walking due to knee joint  Impaired transfers      Subjective Assessment - 01/04/15 1553    Subjective PT is helping a little, but still has the pain in her muscles posterior leg, Anterior thighs.  Able to get up better,    She iss doing her exercises with written copies,    Currently in Pain? Yes   Pain Score 6   can easily get higher   Pain Location Knee   Pain Orientation Right;Left   Pain Descriptors / Indicators Sharp;Throbbing   Pain Radiating Towards back of knees, groin, anterior thighs   Pain Frequency Constant   Aggravating Factors  walking and standing   Pain Relieving Factors rest , change of position   Effect of Pain on Daily Activities walking is limited    Multiple Pain Sites No                         [facility] Adult PT Treatment/Exercise -  01/04/15 1618    Self-Care   Self-Care --  discussed pain control techniques, transfers, posture   Lumbar Exercises: Stretches   Pelvic Tilt 5 reps  5 seconds   Knee/Hip Exercises: Stretches   Active Hamstring Stretch 3 reps;30 seconds   Hip Flexor Stretch Right   Hip Flexor Stretch Limitations over the edge of may   Other Knee/Hip Stretches hip adductor stretch  painful at groin so I stopped.     Knee/Hip Exercises: Aerobic   Recumbent Bike 3 minutes  Patient is thinking about getting one for home   Knee/Hip Exercises: Seated   Clamshell with TheraBand Blue  10 X   Knee/Hip Exercises: Supine   Other Supine Knee/Hip Exercises hip isometric adduction 10 X 2 sets   Other Supine Knee/Hip Exercises Hip isometric abduction with belt 10 X                PT Education - 01/04/15 1758    Education provided Yes   Education Details pain control techniques, transfers, posture.   Person(s) Educated Patient   Methods Explanation   Comprehension Verbalized understanding          PT  Short Term Goals - 01/04/15 1556    PT SHORT TERM GOAL #1   Title Pt will be I with HEP for LEs   Baseline needs written instruction sheets.  Modified independent    Time 4   Period Weeks   Status Partially Met   PT SHORT TERM GOAL #2   Title Pt will report min improvement in ability to perform car transfers, sit to stand, toilet transfers.    Baseline less painful   Time 4   Period Weeks   Status Achieved   PT SHORT TERM GOAL #3   Title Pt will be I with use of RICE, MHP and body mechanics for self care/pain mgmt   Baseline Warm bath, she is working on good posture with getting up.     Time 4   Period Weeks   Status Achieved   PT SHORT TERM GOAL #4   Title Pt will complete balance screen and set goal if appropriate   Status Unable to assess           PT Long Term Goals - 01/04/15 1559    PT LONG TERM GOAL #1   Title Pt will be I with more advanced HEP for LEs   Time 8   Period  Weeks   Status On-going   PT LONG TERM GOAL #2   Title Pt will score 55% or less impaired on FOTO to demo increased functional mobility.    Baseline 63% limitation   Time 8   Period Weeks   Status On-going   PT LONG TERM GOAL #3   Title Pt will be able to walk for 30 min as desired with no more than mod pain in LEs.    Baseline Walks 15 minutes with pain number7/10    Time 8   Period Weeks   Status On-going   PT LONG TERM GOAL #4   Title Pt will be able to do yardwork with less rest breaks and pain min overall in LEs.    Baseline does not do by herself   Time 8   Period Weeks   Status On-going   PT LONG TERM GOAL #5   Title Pt will be able to perform car transfers with min occasional difficulty   Baseline improving , but still difficult.     Time 8   Period Weeks   Status On-going               Plan - 01/04/15 1804    Clinical Impression Statement Less pain with PT.  Patient is concerned that PT has not fixed her problem.  Walking is limited to 15 minutes with moderate pain.  She avoids most yardwork and cannot work in the yard alone due to her frequent rests.  Work activities are painful. with time on her feet.   PT Next Visit Plan ERO to extend POC, patient wants to vcomplete appointments scheduled.   PT Home Exercise Plan sttrtches and stabilization with bands        Problem List Patient Active Problem List   Diagnosis Date Noted  . Varicose veins of leg with complications 08/67/6195  . Leg edema 09/02/2012  . Varicose veins of lower extremities with other complications 09/32/6712  . Pericarditis 10/05/2010  . Chest pain 06/15/2010  . Edema 06/15/2010  . Thyroid disease 06/15/2010    HARRIS,KAREN 01/04/2015, 6:10 PM  [facility] [address] [place], [name], [address] Phone: [phone]   Fax:  [phone]  Name: Cheryl [name] MRN: [id number] Date of Birth: 1955/07/24    Melvenia Needles,  PTA 01/04/2015 6:10 PM Phone: [phone] Fax: [phone]

## 2015-01-06 ENCOUNTER — Ambulatory Visit: Payer: Commercial Indemnity | Attending: Family Medicine | Admitting: Physical Therapy

## 2015-01-06 DIAGNOSIS — R2991 Unspecified symptoms and signs involving the musculoskeletal system: Secondary | ICD-10-CM | POA: Insufficient documentation

## 2015-01-06 DIAGNOSIS — M25662 Stiffness of left knee, not elsewhere classified: Secondary | ICD-10-CM | POA: Diagnosis present

## 2015-01-06 DIAGNOSIS — M25661 Stiffness of right knee, not elsewhere classified: Secondary | ICD-10-CM

## 2015-01-06 DIAGNOSIS — R262 Difficulty in walking, not elsewhere classified: Secondary | ICD-10-CM | POA: Diagnosis present

## 2015-01-06 NOTE — Therapy (Signed)
[place], [name], [address] Phone: [phone]   Fax:  [phone]  Physical Therapy Treatment  Patient Details  Name: Brian Kocourek MRN: [id number] Date of Birth: [birth year] No Data Recorded  Encounter Date: 01/06/2015      PT End of Session - 01/06/15 1728    Visit Number 14   Number of Visits 23   Date for PT Re-Evaluation 02/02/15   PT Start Time 1540   PT Stop Time 1621   PT Time Calculation (min) 41 min   Activity Tolerance Patient tolerated treatment well   Behavior During Therapy [facility] for tasks assessed/performed      Past Medical History  Diagnosis Date  . Chest pain   . Grave's disease   . HTN (hypertension)   . HLD (hyperlipidemia)   . Obesity   . Chronic dermatitis   . Edema of both legs   . Varicose veins   . Anemia     Past Surgical History  Procedure Laterality Date  . Cholecystectomy    . Skin graft    . Endovenous ablation saphenous vein w/ laser      There were no vitals filed for this visit.  Visit Diagnosis:  Knee stiffness, left  Knee stiff, right  Difficulty walking due to knee joint      Subjective Assessment - 01/06/15 1549    Subjective Did go the the doctor yesterday and got next round of injections. Feeling more sore today in the knees.   How long can you sit comfortably? unlimited   How long can you stand comfortably? stands 1-4 hours at a time, can sometimes be 8 hours per day   How long can you walk comfortably? cannot walk >15 min regularly    Patient Stated Goals wanting to be able to move and walk with less pain   Currently in Pain? Yes   Pain Score 6    Pain Location Knee   Pain Orientation Right;Left   Pain Descriptors / Indicators Sharp;Other (Comment)  torn   Pain Type Chronic pain   Aggravating Factors  standing and walking   Pain Relieving Factors sitting, resting                         [facility] Adult PT  Treatment/Exercise - 01/06/15 0001    Exercises   Other Exercises  seated adduction with ball squeeze 1X10   Knee/Hip Exercises: Stretches   Active Hamstring Stretch 3 reps;30 seconds   Active Hamstring Stretch Limitations supine with straight leg and strap   Knee/Hip Exercises: Aerobic   Recumbent Bike 5 min, no resistance   Knee/Hip Exercises: Seated   Clamshell with TheraBand Blue  10 X   Sit to Sand 1 set;5 reps   Knee/Hip Exercises: Supine   Other Supine Knee/Hip Exercises heel digs on bolster 1X10                PT Education - 01/06/15 1727    Education provided Yes   Education Details discusson of PT plan of care, review HEP   Person(s) Educated Patient   Methods Explanation   Comprehension Verbalized understanding          PT Short Term Goals - 01/06/15 1738    PT SHORT TERM GOAL #1   Title Pt will be I with HEP for LEs   Baseline needs written instruction sheets.  Modified independent    Time 2  Period Weeks   Status Partially Met   PT SHORT TERM GOAL #2   Title Pt will report min improvement in ability to perform car transfers, sit to stand, toilet transfers.    Baseline less painful   Status Achieved   PT SHORT TERM GOAL #3   Title Pt will be I with use of RICE, MHP and body mechanics for self care/pain mgmt   Baseline Warm bath, she is working on good posture with getting up.     Status Achieved   PT SHORT TERM GOAL #4   Title Pt will complete balance screen and set goal if appropriate   Baseline Berg 53/56   Period Weeks   Status Unable to assess           PT Long Term Goals - 01/06/15 1740    PT LONG TERM GOAL #1   Title Pt will be I with more advanced HEP for LEs   Time 3   Period Weeks   Status On-going   PT LONG TERM GOAL #2   Title Pt will score 55% or less impaired on FOTO to demo increased functional mobility.    Baseline 63% limitation   Time 3   Period Weeks   Status On-going   PT LONG TERM GOAL #3   Title Pt will be  able to walk for 30 min as desired with no more than mod pain in LEs.    Baseline Walks 15 minutes with pain number7/10    Time 3   Period Weeks   Status On-going   PT LONG TERM GOAL #4   Title Pt will be able to do yardwork with less rest breaks and pain min overall in LEs.    Baseline does not do by herself   Time 3   Period Weeks   Status On-going   PT LONG TERM GOAL #5   Title Pt will be able to perform car transfers with min occasional difficulty   Baseline improving , but still difficult.     Time 3   Period Weeks   Status On-going               Plan - 01/06/15 1734    Clinical Impression Statement Patient reporting that she continues to have ongoing knee pain which is limiting her ability to tolerate standing and ambulation. PT sessions have and continue to focus on LE strength and flexibility both in the clinic and through a HEP. After reviewing the patient's goals and progress it was decided to continue for 2 weeks and then D/C to a HEP.    PT Frequency 2x / week   PT Duration 2 weeks   PT Treatment/Interventions Electrical Stimulation;Iontophoresis 39m/ml Dexamethasone;Moist Heat;Ultrasound;Patient/family education;Taping;Passive range of motion;Balance training;Therapeutic exercise;Cryotherapy;Functional mobility training;Manual techniques;Therapeutic activities   PT Next Visit Plan Review HEP and continue with strengthening as tolerated.   PT Home Exercise Plan Review to ensure correct technique as needed.         Problem List Patient Active Problem List   Diagnosis Date Noted  . Varicose veins of leg with complications 016/38/4665 . Leg edema 09/02/2012  . Varicose veins of lower extremities with other complications 099/35/7017 . Pericarditis 10/05/2010  . Chest pain 06/15/2010  . Edema 06/15/2010  . Thyroid disease 06/15/2010    BLinard Millers PT 01/06/2015, 5:45 PM  [facility] [address]Phone: [phone]  Fax:  [phone]  Name: Aronda Burford MRN: [id number] Date of Birth: [birth year]

## 2015-01-10 ENCOUNTER — Ambulatory Visit: Payer: Commercial Indemnity | Admitting: Physical Therapy

## 2015-01-12 ENCOUNTER — Ambulatory Visit: Payer: Commercial Indemnity | Admitting: Physical Therapy

## 2015-01-12 DIAGNOSIS — M25662 Stiffness of left knee, not elsewhere classified: Secondary | ICD-10-CM

## 2015-01-12 DIAGNOSIS — R262 Difficulty in walking, not elsewhere classified: Secondary | ICD-10-CM

## 2015-01-12 DIAGNOSIS — R2991 Unspecified symptoms and signs involving the musculoskeletal system: Secondary | ICD-10-CM

## 2015-01-12 DIAGNOSIS — M25661 Stiffness of right knee, not elsewhere classified: Secondary | ICD-10-CM

## 2015-01-12 DIAGNOSIS — Z7409 Other reduced mobility: Secondary | ICD-10-CM

## 2015-01-12 NOTE — Patient Instructions (Signed)
Consider taking a cushion to sit on at work.

## 2015-01-12 NOTE — Therapy (Signed)
[place], [name], [address] Phone: [phone]   Fax:  [phone]  Physical Therapy Treatment  Patient Details  Name: Cheryl [name] MRN: [id number] Date of Birth: 1955/08/24 No Data Recorded  Encounter Date: 01/12/2015      PT End of Session - 01/12/15 1741    Visit Number 15   Number of Visits 23   Date for PT Re-Evaluation 02/02/15   PT Start Time 1525   PT Stop Time 1616   PT Time Calculation (min) 51 min   Activity Tolerance Patient tolerated treatment well;Patient limited by pain   Behavior During Therapy [facility] for tasks assessed/performed      Past Medical History  Diagnosis Date  . Chest pain   . Grave's disease   . HTN (hypertension)   . HLD (hyperlipidemia)   . Obesity   . Chronic dermatitis   . Edema of both legs   . Varicose veins   . Anemia     Past Surgical History  Procedure Laterality Date  . Cholecystectomy    . Skin graft    . Endovenous ablation saphenous vein w/ laser      There were no vitals filed for this visit.  Visit Diagnosis:  Knee stiffness, left  Knee stiff, right  Difficulty walking due to knee joint  Impaired transfers      Subjective Assessment - 01/12/15 1533    Subjective Able to wash the dishes and clean up a little better,  Getting in and out of the car getting a little easier.      Currently in Pain? Yes   Pain Score 6    Pain Location Knee   Pain Orientation Posterior   Pain Descriptors / Indicators --  hurt inside   Pain Radiating Towards back of knees   Aggravating Factors  sitting on harder surface   Pain Relieving Factors walking a little, sitting on softer surfaces   Effect of Pain on Daily Activities walking   Multiple Pain Sites No                         [facility] Adult PT Treatment/Exercise - 01/12/15 1530    Ambulation/Gait   Gait Comments Practiced "Walking softly"  to avoid pounding the ground.  discussed  shoes and surfaced for walking.     Lumbar Exercises: Stretches   Double Knee to Chest Stretch --  10 x feet on red ball   Knee/Hip Exercises: Stretches   Active Hamstring Stretch Limitations 1 X 30 seconds, each   Other Knee/Hip Stretches Standing back extension 1 X 15 seconds, did not change posterior thigh pain.   Knee/Hip Exercises: Aerobic   Recumbent Bike She declined   Knee/Hip Exercises: Machines for Strengthening   Total Gym Leg Press 2 plates, small motions 30+ X (This does not hurt)   Knee/Hip Exercises: Standing   Heel Raises 10 reps  both 2 sets   Other Standing Knee Exercises treminal knee extension LT with towel padding,  Increased pain posterior knee.    Knee/Hip Exercises: Seated   Hamstring Curl 10 reps  Blue band each   Knee/Hip Exercises: Supine   Bridges Limitations 10 x legs on red ball SBA   Other Supine Knee/Hip Exercises long leg IR/ER 5X   Other Supine Knee/Hip Exercises Isometric hip adduction, yellow ball 10 X 5 second holds.   Knee/Hip Exercises: Sidelying   Clams 10 X each leg, cues  Manual Therapy   Manual therapy comments unable to palpate and reproduce  the deep posterior thigh pain she reports.                    PT Short Term Goals - 01/12/15 1746    PT SHORT TERM GOAL #1   Title Pt will be I with HEP for LEs   Baseline has the memorized   Time 2   Period Weeks   Status Achieved   PT SHORT TERM GOAL #2   Title Pt will report min improvement in ability to perform car transfers, sit to stand, toilet transfers.    Baseline less painful   Time 4   Period Weeks   Status Achieved   PT SHORT TERM GOAL #3   Title Pt will be I with use of RICE, MHP and body mechanics for self care/pain mgmt   Time 4   Period Weeks   Status Achieved   PT SHORT TERM GOAL #4   Title Pt will complete balance screen and set goal if appropriate   Time 2   Status Unable to assess           PT Long Term Goals - 01/12/15 1747    PT LONG TERM GOAL  #1   Title Pt will be I with more advanced HEP for LEs   Baseline independent with exercises issued so far   Time 3   Period Weeks   Status On-going   PT LONG TERM GOAL #2   Title Pt will score 55% or less impaired on FOTO to demo increased functional mobility.    Time 3   Period Weeks   Status On-going   PT LONG TERM GOAL #3   Title Pt will be able to walk for 30 min as desired with no more than mod pain in LEs.    Baseline 15 minutes   Time 3   Period Weeks   Status On-going   PT LONG TERM GOAL #4   Title Pt will be able to do yardwork with less rest breaks and pain min overall in LEs.    Baseline not in yard this week, will not do alone for safety   Time 3   Period Weeks   Status Unable to assess   PT LONG TERM GOAL #5   Title Pt will be able to perform car transfers with min occasional difficulty   Baseline easier now   Time 3   Period Weeks   Status Partially Met               Plan - 01/12/15 1742    Clinical Impression Statement Patient hs home exercises memorized.  She has had a series of injections recently which she understands the will start to help next month.  She wants to return to walking for exercise.  She is independent with exercises issued so far.   PT Next Visit Plan Strengthening as tolerated.  She likes the leg press.   PT Home Exercise Plan continue.  Try walking and see how it goes.  Wear good shoes.   Consulted and Agree with Plan of Care Patient        Problem List Patient Active Problem List   Diagnosis Date Noted  . Varicose veins of leg with complications 75/91/6384  . Leg edema 09/02/2012  . Varicose veins of lower extremities with other complications 66/59/9357  . Pericarditis 10/05/2010  . Chest pain 06/15/2010  . Edema  06/15/2010  . Thyroid disease 06/15/2010    [facility] 01/12/2015, 5:51 PM  [facility] [address] [place], [name], [address] Phone: [phone]    Fax:  [phone]  Name: Cheryl [name] MRN: [id number] Date of Birth: Dec 15, 1955    Melvenia Needles, PTA 01/12/2015 5:51 PM Phone: [phone] Fax: [phone]

## 2015-01-17 ENCOUNTER — Ambulatory Visit: Payer: Commercial Indemnity | Admitting: Physical Therapy

## 2015-01-17 DIAGNOSIS — R2991 Unspecified symptoms and signs involving the musculoskeletal system: Secondary | ICD-10-CM

## 2015-01-17 DIAGNOSIS — R262 Difficulty in walking, not elsewhere classified: Secondary | ICD-10-CM

## 2015-01-17 DIAGNOSIS — M25661 Stiffness of right knee, not elsewhere classified: Secondary | ICD-10-CM

## 2015-01-17 DIAGNOSIS — Z7409 Other reduced mobility: Secondary | ICD-10-CM

## 2015-01-17 DIAGNOSIS — M25662 Stiffness of left knee, not elsewhere classified: Secondary | ICD-10-CM | POA: Diagnosis not present

## 2015-01-17 NOTE — Therapy (Signed)
[place], [name], [address] Phone: [phone]   Fax:  [phone]  Physical Therapy Treatment  Patient Details  Name: Cheryl [name] MRN: [id number] Date of Birth: 1955/10/23 Referring Provider: Vickki Hearing, MD  Encounter Date: 01/17/2015      PT End of Session - 01/17/15 1607    Visit Number 16   PT Start Time 7482   PT Stop Time 1602   PT Time Calculation (min) 31 min   Activity Tolerance Patient tolerated treatment well   Behavior During Therapy [facility] for tasks assessed/performed      Past Medical History  Diagnosis Date  . Chest pain   . Grave's disease   . HTN (hypertension)   . HLD (hyperlipidemia)   . Obesity   . Chronic dermatitis   . Edema of both legs   . Varicose veins   . Anemia     Past Surgical History  Procedure Laterality Date  . Cholecystectomy    . Skin graft    . Endovenous ablation saphenous vein w/ laser      There were no vitals filed for this visit.  Visit Diagnosis:  Knee stiffness, left  Knee stiff, right  Difficulty walking due to knee joint  Impaired transfers      Subjective Assessment - 01/17/15 1534    Subjective Has been having pain the past few times.  States today is bad because she had to stand all day at work.   Patient Stated Goals wanting to be able to move and walk with less pain   Currently in Pain? Yes   Pain Score 7    Pain Location Knee   Pain Orientation Posterior   Pain Descriptors / Indicators Sore   Pain Type Chronic pain   Pain Radiating Towards back of knees   Pain Onset More than a month ago   Pain Frequency Constant   Aggravating Factors  standing   Pain Relieving Factors walking a little bit, sitting on softer surfaces            [facility] PT Assessment - 01/17/15 1607    Assessment   Referring Provider Vickki Hearing, MD   Observation/Other Assessments   Focus on Therapeutic Outcomes (FOTO)  37 (63% limited)                      [facility] Adult PT Treatment/Exercise - 01/17/15 1536    Self-Care   Self-Care Other Self-Care Comments   Other Self-Care Comments  long discussion with pt re: HEP, gradual return to activity, current functional status.  Pt reports shopping x 3 hours with 2 seated breaks and pt feels she could perform light yardwork without increase in pain.  Pt verbalized plans to conitnue HEP and gradually improve walking time as tolerated.  Educated on indications to return to PT, need for new referral and process for 2nd opinion as pt feels she may inquire about a 2nd opinion.     Knee/Hip Exercises: Stretches   Active Hamstring Stretch 3 reps;30 seconds   Active Hamstring Stretch Limitations seated with slightly bent knee   Other Knee/Hip Stretches attempted self mobilization of hamstrings with tennis ball seated; no change in pain or mobility after self mobilization                PT Education - 01/17/15 1607    Education provided Yes   Education Details see self care section   Person(s) Educated Patient  Methods Explanation   Comprehension Verbalized understanding          PT Short Term Goals - 01/17/15 1610    PT SHORT TERM GOAL #1   Title Pt will be I with HEP for LEs   Status Achieved   PT SHORT TERM GOAL #2   Title Pt will report min improvement in ability to perform car transfers, sit to stand, toilet transfers.    Status Achieved   PT SHORT TERM GOAL #3   Title Pt will be I with use of RICE, MHP and body mechanics for self care/pain mgmt   Status Achieved   PT SHORT TERM GOAL #4   Title Pt will complete balance screen and set goal if appropriate   Baseline Berg 53/56; no need for goal   Status Achieved           PT Long Term Goals - 01/17/15 1610    PT LONG TERM GOAL #1   Title Pt will be I with more advanced HEP for LEs   Status Achieved   PT LONG TERM GOAL #2   Title Pt will score 55% or less impaired on FOTO to demo increased  functional mobility.    Baseline improved from 72% limited to 63% limited   Status Not Met   PT LONG TERM GOAL #3   Title Pt will be able to walk for 30 min as desired with no more than mod pain in LEs.    Status Achieved   PT LONG TERM GOAL #4   Title Pt will be able to do yardwork with less rest breaks and pain min overall in LEs.    Baseline achieved with simulated activities; able to stand and shop for 3 hours with 2-3 sitting breaks   Status Achieved   PT LONG TERM GOAL #5   Title Pt will be able to perform car transfers with min occasional difficulty   Baseline occasionally has mod difficulty with car transfers   Status Partially Met               Plan - 01/17/15 1609    Clinical Impression Statement Pt ready for transition to home program.  Most goals at least partially met or met with simulated activities.  Pt continues to be limited by pain.   PT Next Visit Plan d/c PT today   Consulted and Agree with Plan of Care Patient        Problem List Patient Active Problem List   Diagnosis Date Noted  . Varicose veins of leg with complications 82/50/5397  . Leg edema 09/02/2012  . Varicose veins of lower extremities with other complications 67/34/1937  . Pericarditis 10/05/2010  . Chest pain 06/15/2010  . Edema 06/15/2010  . Thyroid disease 06/15/2010   Laureen Abrahams, PT, DPT 01/17/2015 4:15 PM  [facility] [address] [place], [name], [address] Phone: [phone]   Fax:  [phone]  Name: Cheryl [name] MRN: [id number] Date of Birth: 11-16-1955     PHYSICAL THERAPY DISCHARGE SUMMARY  Visits from Start of Care: 16  Current functional level related to goals / functional outcomes: See above   Remaining deficits: See goals above; pt continues to be limited by pain but reports improvement overall in function   Education / Equipment: HEP, gradual increase in exercise/walking  Plan: Patient  agrees to discharge.  Patient goals were partially met. Patient is being discharged due to  achieving maximal rehab potential.????  Laureen Abrahams, PT, DPT 01/17/2015 4:15 PM  [facility] [address], [place] [address]  [phone] (office) [phone] (fax)

## 2015-01-20 ENCOUNTER — Encounter: Payer: Commercial Indemnity | Admitting: Physical Therapy

## 2015-05-23 ENCOUNTER — Other Ambulatory Visit: Payer: Self-pay

## 2015-05-23 DIAGNOSIS — Z1231 Encounter for screening mammogram for malignant neoplasm of breast: Secondary | ICD-10-CM

## 2015-06-05 ENCOUNTER — Ambulatory Visit (INDEPENDENT_AMBULATORY_CARE_PROVIDER_SITE_OTHER): Payer: Managed Care, Other (non HMO)

## 2015-06-05 ENCOUNTER — Ambulatory Visit (INDEPENDENT_AMBULATORY_CARE_PROVIDER_SITE_OTHER): Payer: Managed Care, Other (non HMO) | Admitting: Physician Assistant

## 2015-06-05 VITALS — BP 148/96 | HR 73 | Temp 98.0°F | Resp 18 | Ht 65.5 in | Wt 267.8 lb

## 2015-06-05 DIAGNOSIS — M25572 Pain in left ankle and joints of left foot: Secondary | ICD-10-CM

## 2015-06-05 DIAGNOSIS — M79659 Pain in unspecified thigh: Secondary | ICD-10-CM

## 2015-06-05 DIAGNOSIS — M79672 Pain in left foot: Secondary | ICD-10-CM

## 2015-06-05 MED ORDER — DICLOFENAC SODIUM 1 % TD GEL: 2.0000 g | Freq: Four times a day (QID) | TRANSDERMAL | Status: DC

## 2015-06-05 NOTE — Progress Notes (Signed)
[facility] [address] [place] [address] [phone]  Date:  06/05/2015   Name:  Cheryl [name]   DOB:  10/17/1955   MRN:  [id number]  PCP:  Woody Seller, MD    Chief Complaint: Ankle Pain   History of Present Illness:  This is a 60 y.o. female with PMH hypothyroidism, varicose veins, hx pericarditis who is presenting with left ankle pain x a year or more. Over the past month pain has gotten much worsen. She has excruciating pain with pushing herself to standing. The pain is mostly located to anterior foot/ankle. She has other pains as well in her knees and thighs. She was did 2 months of PT 11/2014 - 01/2015 for bilateral stiff knees, left knee with worse pain than right. After PT her pain had not improved but she gained more function. She has bilateral baker's cysts. The left was drained twice in the past year. She states she has been told she knees a left knee replacement but she is not wanting to do that at this time. She has been seen at [facility]. She has also been seen by Dr. Kellie Simmering with vascular surgery for varicose veins. In 2014 she had ablation of bilateral great saphenous veins. She returned to see Dr. Kellie Simmering 08/2014 with complaint of calf and posterior thigh pain and aching. He did another duplex u/s bilateral extremities at that time that showed no DVT and no change in her venous system from previous imaging. He felt there was nothing to do from a vascular standpoint at that time. She continues to have bilateral calf and posterior thigh pain with walking, getting worse over time.  She is wanting a referral to [facility] at this time as her sister goes there and likes their practice.   Review of Systems:  Review of Systems See HPI  Patient Active Problem List   Diagnosis Date Noted  . Varicose veins of leg with complications 19/50/9326  . Leg edema 09/02/2012  . Varicose veins of lower extremities with other complications  71/24/5809  . Pericarditis 10/05/2010  . Chest pain 06/15/2010  . Edema 06/15/2010  . Thyroid disease 06/15/2010    Prior to Admission medications   Medication Sig Start Date End Date Taking? Authorizing Provider  acetaminophen (TYLENOL) 500 MG tablet Take 500 mg by mouth every 6 (six) hours as needed.     Yes Historical Provider, MD  levothyroxine (SYNTHROID, LEVOTHROID) 150 MCG tablet Take 150 mcg by mouth daily. 06/04/14  Yes Historical Provider, MD  ibuprofen (ADVIL,MOTRIN) 200 MG tablet Take 200 mg by mouth every 6 (six) hours as needed. Reported on 06/05/2015    Historical Provider, MD    Allergies  Allergen Reactions  . Antihistamines, Chlorpheniramine-Type     Past Surgical History  Procedure Laterality Date  . Cholecystectomy    . Skin graft    . Endovenous ablation saphenous vein w/ laser      Social History  Substance Use Topics  . Smoking status: Never Smoker   . Smokeless tobacco: Never Used  . Alcohol Use: No    Family History  Problem Relation Age of Onset  . Coronary artery disease    . Cancer Mother     lung  . Other Mother     varicose veins  . Varicose Veins Mother   . Heart disease Father   . Hyperlipidemia Father   . Hypertension Father   . Other Father     pvd  .  Heart attack Father   . Peripheral vascular disease Father     amputation  . Cancer Sister   . Cancer Sister     Medication list has been reviewed and updated.  Physical Examination:  Physical Exam  Constitutional: She is oriented to person, place, and time. She appears well-developed and well-nourished. No distress.  HENT:  Head: Normocephalic and atraumatic.  Right Ear: Hearing normal.  Left Ear: Hearing normal.  Nose: Nose normal.  Eyes: Conjunctivae and lids are normal. Right eye exhibits no discharge. Left eye exhibits no discharge. No scleral icterus.  Cardiovascular: Normal rate and regular rhythm.   Pulses:      Popliteal pulses are 1+ on the right side, and 1+ on  the left side.       Dorsalis pedis pulses are 1+ on the right side, and 1+ on the left side.  Pulmonary/Chest: Effort normal. No respiratory distress.  Musculoskeletal: Normal range of motion.  Neurological: She is alert and oriented to person, place, and time.  Skin: Skin is warm, dry and intact.  Venous stasis dermatitis bilaterally on lower legs No calf, knee or thigh tenderness. No tenderness to palpation over left ankle. Full ROM ankle and foot, pain at dorsal foot/anterior ankle with dorsiflexion   Psychiatric: She has a normal mood and affect. Her speech is normal and behavior is normal. Thought content normal.   BP 148/96 mmHg  Pulse 73  Temp(Src) 98 F (36.7 C) (Oral)  Resp 18  Ht 5' 5.5" (1.664 m)  Wt 267 lb 12.8 oz (121.473 kg)  BMI 43.87 kg/m2  SpO2 98% Dg Ankle Complete Left  06/05/2015  CLINICAL DATA:  Left ankle pain, worsening. EXAM: LEFT ANKLE COMPLETE - 3+ VIEW COMPARISON:  None. FINDINGS: Osseous alignment is normal. Bone mineralization is normal. No acute or suspicious osseous lesion. No fracture line or displaced fracture fragment. No significant degenerative change. Mild degenerative spurring noted within the midfoot. Diffuse calcifications throughout the soft tissues about the distal left tibia and fibula, presumably related to venous insufficiency. Soft tissues about the left ankle are otherwise unremarkable. IMPRESSION: 1. No acute osseous abnormality. 2. Extensive calcifications within the soft tissues about the distal left tibia and fibula, most likely sequela of chronic venous insufficiency. Less likely considerations would include metabolic and autoimmune disorders. Electronically Signed   By: Franki Cabot M.D.   On: 06/05/2015 10:08   Dg Foot Complete Left  06/05/2015  CLINICAL DATA:  Left foot pain, no known injury. EXAM: LEFT FOOT - COMPLETE 3+ VIEW COMPARISON:  None. FINDINGS: Osseous alignment is normal. There is at least mild diffuse osteopenia which  limits characterization of osseous detail, but no acute or suspicious osseous lesion is seen. No fracture line or displaced fracture fragment. There is mild degenerative spurring within the midfoot. No evidence of advanced degenerative osteoarthritis. No erosions or other secondary signs of an inflammatory arthritis. Soft tissues about the left foot are unremarkable. Incidental note made of enthesophytes along the posterior and plantar margins of the calcaneus. IMPRESSION: No acute findings.  Mild degenerative change within the midfoot. Electronically Signed   By: Franki Cabot M.D.   On: 06/05/2015 10:10    Assessment and Plan:  1. Foot pain, left 2. Ankle pain, left 3. Pain of thighs Radiograph ankle/foot with only mild degenerative changes in midfoot (area of prior fracture many years ago). She will continue with tylenol as needed. She will also try diclofenac gel to see if helpful. Referred to foot and  ankle specialist at [facility] for further eval -- could possibly benefit from steroid injections. Pt with long standing posterior calf and thigh pain, worsening over the past few months, worse with walking. She was clear from a vascular standpoint 1 year ago when she met with Dr. Kellie Simmering. Possible that she could have arterial blockages. Sent for ABI testing. - DG Ankle Complete Left; Future - DG Foot Complete Left; Future - diclofenac sodium (VOLTAREN) 1 % GEL; Apply 2 g topically 4 (four) times daily.  Dispense: 100 g; Refill: 1 - Ambulatory referral to Orthopedic Surgery - Ultrasound ankle / brachial indices extremity complete; Future   Benjaman Pott. Drenda Freeze, MHS [facility]  06/05/2015

## 2015-06-05 NOTE — Patient Instructions (Addendum)
Continue tylenol as needed for pain Apply diclofenac gel up to 4 times a day as needed. You will get a phone call to make appt with ortho You will get a phone call to make appt for ultrasound of legs Return as needed    IF you received an x-ray today, you will receive an invoice from [facility]. Please contact [facility] at [phone] with questions or concerns regarding your invoice.   IF you received labwork today, you will receive an invoice from [facility]. Please contact [facility] at [phone] with questions or concerns regarding your invoice.   Our billing staff will not be able to assist you with questions regarding bills from these companies.  You will be contacted with the lab results as soon as they are available. The fastest way to get your results is to activate your My Chart account. Instructions are located on the last page of this paperwork. If you have not heard from us regarding the results in 2 weeks, please contact this office.

## 2015-06-09 ENCOUNTER — Telehealth: Payer: Self-pay | Admitting: Radiology

## 2015-06-09 NOTE — Telephone Encounter (Signed)
At the patient's request, I made a copy of her x ray, on CD , and placed in the pick up drawer.

## 2015-06-10 ENCOUNTER — Other Ambulatory Visit: Payer: Self-pay | Admitting: Physician Assistant

## 2015-06-10 DIAGNOSIS — M79606 Pain in leg, unspecified: Secondary | ICD-10-CM

## 2015-06-10 NOTE — Progress Notes (Signed)
[facility]  Received call from patient stating that she had been to urgent care on 06/05/15 and was told we would call her to schedule appointment. I could not find order for patient. I looked under patient's orders by Lanier ClamNicole Bush and noticed there was an order of Ultrasound Ankle Brachial Index, but this was incorrectly ordered, so vascular lab was unaware of this order for patient. I corrected order to ABI w/wo TBI (Vas US). Patient is scheduled for 5/10 at 3:00pm.   Farrel DemarkJill Eunice, RDMS, RVT Vascular Sonographer

## 2015-06-13 ENCOUNTER — Ambulatory Visit
Admission: RE | Admit: 2015-06-13 | Discharge: 2015-06-13 | Disposition: A | Discharge status: Home / Self Care | Payer: Commercial Indemnity | Source: Ambulatory Visit

## 2015-06-13 DIAGNOSIS — Z1231 Encounter for screening mammogram for malignant neoplasm of breast: Secondary | ICD-10-CM

## 2015-06-15 ENCOUNTER — Ambulatory Visit (HOSPITAL_COMMUNITY)
Admission: RE | Admit: 2015-06-15 | Discharge: 2015-06-15 | Disposition: A | Discharge status: Home / Self Care | Payer: Managed Care, Other (non HMO) | Source: Ambulatory Visit | Attending: Family Medicine | Admitting: Family Medicine

## 2015-06-15 DIAGNOSIS — M79659 Pain in unspecified thigh: Secondary | ICD-10-CM | POA: Insufficient documentation

## 2015-06-15 DIAGNOSIS — M79606 Pain in leg, unspecified: Secondary | ICD-10-CM | POA: Diagnosis not present

## 2015-06-15 NOTE — Progress Notes (Signed)
VASCULAR LAB PRELIMINARY  ARTERIAL  ABI completed:    RIGHT    LEFT    PRESSURE WAVEFORM  PRESSURE WAVEFORM  BRACHIAL 193 Triphasic BRACHIAL 182 Triphasic  DP 212 Triphasic DP 197 Triphasic  AT   AT    PT 228 Triphasic PT 192 Triphasic  PER   PER    GREAT TOE  NA GREAT TOE  NA    RIGHT LEFT  ABI 1.18 1.02   Bilateral ABIs are within normal limits at rest.  06/15/2015 3:53 PM Gertie FeyMichelle Katey Barrie, RVT, RDCS, RDMS

## 2015-09-21 DIAGNOSIS — L301 Dyshidrosis [pompholyx]: Secondary | ICD-10-CM | POA: Insufficient documentation

## 2015-10-06 DIAGNOSIS — M170 Bilateral primary osteoarthritis of knee: Secondary | ICD-10-CM | POA: Insufficient documentation

## 2015-10-06 DIAGNOSIS — M48061 Spinal stenosis, lumbar region without neurogenic claudication: Secondary | ICD-10-CM | POA: Insufficient documentation

## 2016-05-18 ENCOUNTER — Other Ambulatory Visit: Payer: Self-pay | Admitting: Obstetrics & Gynecology

## 2016-05-18 DIAGNOSIS — Z1231 Encounter for screening mammogram for malignant neoplasm of breast: Secondary | ICD-10-CM

## 2016-06-13 ENCOUNTER — Ambulatory Visit
Admission: RE | Admit: 2016-06-13 | Discharge: 2016-06-13 | Disposition: A | Discharge status: Home / Self Care | Payer: BLUE CROSS/BLUE SHIELD | Source: Ambulatory Visit | Attending: Obstetrics & Gynecology | Admitting: Obstetrics & Gynecology

## 2016-06-13 DIAGNOSIS — Z1231 Encounter for screening mammogram for malignant neoplasm of breast: Secondary | ICD-10-CM

## 2016-08-29 ENCOUNTER — Ambulatory Visit (HOSPITAL_COMMUNITY)
Admission: EM | Admit: 2016-08-29 | Discharge: 2016-08-29 | Disposition: A | Discharge status: Home / Self Care | Payer: BLUE CROSS/BLUE SHIELD | Attending: Family Medicine | Admitting: Family Medicine

## 2016-08-29 ENCOUNTER — Encounter (HOSPITAL_COMMUNITY): Payer: Self-pay | Admitting: Emergency Medicine

## 2016-08-29 DIAGNOSIS — T148XXA Other injury of unspecified body region, initial encounter: Secondary | ICD-10-CM

## 2016-08-29 DIAGNOSIS — Z041 Encounter for examination and observation following transport accident: Secondary | ICD-10-CM | POA: Diagnosis not present

## 2016-08-29 DIAGNOSIS — T07XXXA Unspecified multiple injuries, initial encounter: Secondary | ICD-10-CM | POA: Diagnosis not present

## 2016-08-29 MED ORDER — NAPROXEN 500 MG PO TABS: 500.0000 mg | ORAL_TABLET | Freq: Two times a day (BID) | ORAL | 0 refills | Status: AC

## 2016-08-29 MED ORDER — CYCLOBENZAPRINE HCL 5 MG PO TABS: 2.5000 mg | ORAL_TABLET | Freq: Every day | ORAL | 0 refills | Status: DC
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
## 2016-08-29 NOTE — Discharge Instructions (Signed)
Take naproxen 500 mg twice a day for 10 days. Take Flexeril 2.5-5 mg at night for muscle spasm. You can have worsening soreness the next 24-48 hours, and this is normal. You may have more bruising that appear, at this is also normal. Hot and ice compresses as needed. Muscle strains and bruising can take 2-3 weeks to completely resolve, but you should be feeling better each week. Follow up with PCP for further evaluation as needed.
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
## 2016-09-03 ENCOUNTER — Ambulatory Visit (HOSPITAL_COMMUNITY)
Admission: EM | Admit: 2016-09-03 | Discharge: 2016-09-03 | Disposition: A | Discharge status: Home / Self Care | Payer: BLUE CROSS/BLUE SHIELD | Attending: Family Medicine | Admitting: Family Medicine

## 2016-09-03 ENCOUNTER — Encounter (HOSPITAL_COMMUNITY): Payer: Self-pay | Admitting: Emergency Medicine

## 2016-09-03 ENCOUNTER — Ambulatory Visit (INDEPENDENT_AMBULATORY_CARE_PROVIDER_SITE_OTHER): Payer: Self-pay

## 2016-09-03 DIAGNOSIS — T148XXA Other injury of unspecified body region, initial encounter: Secondary | ICD-10-CM

## 2016-09-03 DIAGNOSIS — M7918 Myalgia, other site: Secondary | ICD-10-CM

## 2016-09-03 DIAGNOSIS — M791 Myalgia: Secondary | ICD-10-CM

## 2016-09-03 DIAGNOSIS — R5383 Other fatigue: Secondary | ICD-10-CM

## 2016-09-03 DIAGNOSIS — R0789 Other chest pain: Secondary | ICD-10-CM | POA: Diagnosis not present

## 2016-09-03 NOTE — Discharge Instructions (Signed)
Your chest x-ray is normal there is no evidence of rib fracture. Please take Tylenol 1000 mg in the morning. Take naproxen and Flexeril the evening.  Please call to schedule follow appointment with your primary care provider.
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
## 2017-01-02 ENCOUNTER — Encounter: Payer: Self-pay | Admitting: Physical Therapy

## 2017-01-02 ENCOUNTER — Ambulatory Visit: Payer: BLUE CROSS/BLUE SHIELD | Attending: Family Medicine | Admitting: Physical Therapy

## 2017-01-02 DIAGNOSIS — M25562 Pain in left knee: Secondary | ICD-10-CM | POA: Insufficient documentation

## 2017-01-02 DIAGNOSIS — M62830 Muscle spasm of back: Secondary | ICD-10-CM | POA: Diagnosis present

## 2017-01-02 DIAGNOSIS — G8929 Other chronic pain: Secondary | ICD-10-CM | POA: Insufficient documentation

## 2017-01-02 DIAGNOSIS — M6281 Muscle weakness (generalized): Secondary | ICD-10-CM | POA: Diagnosis present

## 2017-01-02 DIAGNOSIS — M545 Low back pain: Secondary | ICD-10-CM | POA: Insufficient documentation

## 2017-01-02 NOTE — Patient Instructions (Signed)
   HIP FLEXOR STRETCH 2  While lying on a table or high bed, let the affected leg lower towards the floor until a stretch is felt along the front of your thigh.   At the same time, grasp your opposite knee and pull it towards your chest.     [facility] [address], [address] [place], [place] [address] Phone # [phone] Fax [phone]  
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
## 2017-01-07 ENCOUNTER — Encounter: Payer: Self-pay | Admitting: Physical Therapy

## 2017-01-07 ENCOUNTER — Ambulatory Visit: Payer: BLUE CROSS/BLUE SHIELD | Attending: Family Medicine | Admitting: Physical Therapy

## 2017-01-07 DIAGNOSIS — G8929 Other chronic pain: Secondary | ICD-10-CM | POA: Diagnosis present

## 2017-01-07 DIAGNOSIS — M25562 Pain in left knee: Secondary | ICD-10-CM | POA: Insufficient documentation

## 2017-01-07 DIAGNOSIS — M545 Low back pain: Secondary | ICD-10-CM | POA: Insufficient documentation

## 2017-01-07 DIAGNOSIS — M62830 Muscle spasm of back: Secondary | ICD-10-CM | POA: Insufficient documentation

## 2017-01-07 DIAGNOSIS — M6281 Muscle weakness (generalized): Secondary | ICD-10-CM | POA: Insufficient documentation

## 2017-01-07 NOTE — Patient Instructions (Signed)
   SEATED HAMSTRING STRETCH  While seated, rest your heel on the floor with your knee straight and gently lean forward until a stretch is felt behind your knee/thigh. Hold 20 sec repeat 5x each side    Hip Flexor Stretch on Step  Stand upright with one leg forward lunging onto a step. Keeping your hips level and your back straight, lean forward into a deeper lunge with your back leg behind you. You should feel a stretch on the front of the hip of your back leg. If you cannot feel an adequate stretch, lean forward more or back your leg up further. It is okay if your heel rises from the ground for this stretch. Hold this stretch statically for 10 seconds do 5x  [facility] [address], [address] [place], [place] [address] Phone # [phone] Fax [phone]

## 2017-01-07 NOTE — Therapy (Signed)
[facility] [address] [place], [name], [address] Phone: [phone]   Fax:  [phone]  Physical Therapy Treatment  Patient Details  Name: Cheryl [name] MRN: [id number] Date of Birth: July 13, 1955 Referring Provider: Kathryne Eriksson, MD    Encounter Date: 01/07/2017  PT End of Session - 01/07/17 1550    Visit Number  2    Number of Visits  16    Date for PT Re-Evaluation  02/27/17    Authorization Type  BCBS    Authorization Time Period  01/02/17 to 02/27/17    PT Start Time  1535    PT Stop Time  1615    PT Time Calculation (min)  40 min    Activity Tolerance  Patient tolerated treatment well;No increased pain    Behavior During Therapy  WFL for tasks assessed/performed       Past Medical History:  Diagnosis Date  . Anemia   . Chest pain   . Chronic dermatitis   . Edema of both legs   . Grave's disease   . HLD (hyperlipidemia)   . HTN (hypertension)   . Obesity   . Varicose veins     Past Surgical History:  Procedure Laterality Date  . CHOLECYSTECTOMY    . ENDOVENOUS ABLATION SAPHENOUS VEIN W/ LASER    . SKIN GRAFT      There were no vitals filed for this visit.  Subjective Assessment - 01/07/17 1539    Subjective  when I am sitting down it feels better but my back hurts all the time.  It is hurting today after working 7 hours and having to do a lot of walking    Pertinent History  chest pain, HTN    Limitations  House hold activities;Standing    Currently in Pain?  Yes    Pain Score  8     Pain Location  Back    Pain Orientation  Lower    Pain Descriptors / Indicators  Radiating;Sore;Cramping    Pain Type  Chronic pain    Pain Radiating Towards  around to the abdomen    Pain Onset  More than a month ago    Pain Frequency  Constant    Aggravating Factors   walking and moving, sleeping    Pain Relieving Factors  lying down or changing position    Effect of Pain on Daily Activities  work    Multiple Pain Sites  No                      [facility] Adult PT Treatment/Exercise - 01/07/17 0001      Lumbar Exercises: Stretches   Lower Trunk Rotation  -- 10 reps      Lumbar Exercises: Supine   Clam  20 reps red band (pt felt it was easy)    Straight Leg Raise  20 reps;3 seconds bent knee raise; cues to brace abdom    Other Supine Lumbar Exercises  pelvic tilts 20x; pelvic tilt with heel slides and quad set 20x each side      Knee/Hip Exercises: Aerobic   Nustep  L1 x 8 min      educated and performed stretches in HEP seen below       PT Education - 01/07/17 1617    Education provided  Yes    Education Details  hip flexor and hamstring stretch    Person(s) Educated  Patient    Methods  Explanation;Demonstration;Handout    Comprehension  Verbalized understanding;Returned demonstration       PT Short Term Goals - 01/02/17 1835      PT SHORT TERM GOAL #1   Title  Pt will demo consistency and independence with her HEP to improve LE strength and decrease pain.     Time  4    Period  Weeks    Status  New    Target Date  01/30/17      PT SHORT TERM GOAL #2   Title  Pt will report atleast 25% improvement in her low back pain/activity tolerance from the start of therapy.     Time  4    Period  Weeks      PT SHORT TERM GOAL #3   Title  Pt will demo improved mechanics with transitions sit to/from supine with proper log roll technique without assistance from the therapist, which will decrease strain on her low back.     Time  4    Period  Weeks    Status  New      PT SHORT TERM GOAL #4   Title  Pt will demo improved hip extension ROM to atleast neutral during flexibility testing, which will increase her efficiency with ambulation.     Time  4    Period  Weeks    Status  New        PT Long Term Goals - 01/02/17 1838      PT LONG TERM GOAL #1   Title  Pt will be independent with more advanced HEP to prepare for discharge from PT.     Time  8     Period  Weeks    Status  New    Target Date  02/27/17      PT LONG TERM GOAL #2   Title  Pt will score atleast 50% or less impaired on FOTO to demo increased functional mobility.     Time  8    Period  Weeks    Status  New      PT LONG TERM GOAL #3   Title  Pt will report improved standing tolerance at work to atleast 30 min without increase in her low back pain.     Time  4    Period  Weeks    Status  New      PT LONG TERM GOAL #4   Title  Pt will complete 5x sit to stand in less than 14 sec without UE support, to reflect an improvement in her functional strength.     Time  8    Period  Weeks    Status  New      PT LONG TERM GOAL #5   Title  Pt will complete the TUG in less than 14 sec without UE support, to reflect an improvement in her balance and mobility.     Time  8    Period  Weeks    Status  New      Additional Long Term Goals   Additional Long Term Goals  Yes      PT LONG TERM GOAL #6   Title  Pt will maintain SLS for atleast 10 sec on each LE without LOB, to demonstrate improved hip stability and decrease her risk of falling.     Time  8    Period  Weeks    Status  New  Plan - 01/07/17 1712    Clinical Impression Statement  No goals met at this time due to first treatment since eval.  Pt did well with exercises and was monitored for pain.  Pt has difficulty distinguishing pain from stretch or muscle fatigue.  Pt needs tactile and verbal cues for pelvic tilt but was able to perform correctly on her own at the end of session today.  Pt continues to need skilled PT for improved hip and knee extension, core strength for improved functional mobitly and gait.    PT Treatment/Interventions  ADLs/Self Care Home Management;Cryotherapy;Traction;Moist Heat;Iontophoresis '4mg'$ /ml Dexamethasone;Electrical Stimulation;Gait training;Neuromuscular re-education;Stair training;Functional mobility training;Balance training;Therapeutic exercise;Therapeutic  activities;Patient/family education;Manual techniques;Passive range of motion;Dry needling    PT Next Visit Plan  Trunk strengthenging/gentle ROM, LE strengthening progressions, manual techniques and modalities to address muscle spasm and pain     Consulted and Agree with Plan of Care  Patient       Patient will benefit from skilled therapeutic intervention in order to improve the following deficits and impairments:  Abnormal gait, Decreased balance, Decreased endurance, Decreased mobility, Difficulty walking, Hypomobility, Increased muscle spasms, Improper body mechanics, Obesity, Decreased range of motion, Decreased strength, Impaired flexibility, Postural dysfunction, Pain  Visit Diagnosis: Chronic bilateral low back pain, with sciatica presence unspecified  Muscle spasm of back  Muscle weakness (generalized)  Chronic pain of left knee     Problem List Patient Active Problem List   Diagnosis Date Noted  . Varicose veins of leg with complications 28/76/8115  . Leg edema 09/02/2012  . Varicose veins of lower extremities with other complications 72/62/0355  . Pericarditis 10/05/2010  . Chest pain 06/15/2010  . Edema 06/15/2010  . Thyroid disease 06/15/2010    Zannie Cove, PT 01/07/2017, 5:17 PM  [facility] [address], [place], [name], [address] Phone: [phone]   Fax:  [phone]  Name: Shandelle Borrelli MRN: [id number] Date of Birth: 01-01-1956

## 2017-01-10 ENCOUNTER — Ambulatory Visit: Payer: BLUE CROSS/BLUE SHIELD | Admitting: Physical Therapy

## 2017-01-10 DIAGNOSIS — M62830 Muscle spasm of back: Secondary | ICD-10-CM

## 2017-01-10 DIAGNOSIS — M545 Low back pain: Principal | ICD-10-CM

## 2017-01-10 DIAGNOSIS — M6281 Muscle weakness (generalized): Secondary | ICD-10-CM

## 2017-01-10 DIAGNOSIS — G8929 Other chronic pain: Secondary | ICD-10-CM

## 2017-01-10 NOTE — Therapy (Signed)
[facility] [address], [address] [place], [place], [address] Phone: [phone]   Fax:  [phone]  Physical Therapy Treatment  Patient Details  Name: Cheryl [name] MRN: [id number] Date of Birth: [birth year] Referring Provider: Benedetto GoadFred Wilson, MD    Encounter Date: 01/10/2017  PT End of Session - 01/10/17 1628    Visit Number  3    Number of Visits  16    Date for PT Re-Evaluation  02/27/17    Authorization Type  BCBS    Authorization Time Period  01/02/17 to 02/27/17    PT Start Time  1615    PT Stop Time  1653    PT Time Calculation (min)  38 min    Activity Tolerance  Patient tolerated treatment well       Past Medical History:  Diagnosis Date  . Anemia   . Chest pain   . Chronic dermatitis   . Edema of both legs   . Grave's disease   . HLD (hyperlipidemia)   . HTN (hypertension)   . Obesity   . Varicose veins     Past Surgical History:  Procedure Laterality Date  . CHOLECYSTECTOMY    . ENDOVENOUS ABLATION SAPHENOUS VEIN W/ LASER    . SKIN GRAFT      There were no vitals filed for this visit.  Subjective Assessment - 01/10/17 1621    Subjective  Patient arrives 1 hour early for appt.  She states her back and legs hurts from     Currently in Pain?  Yes    Pain Score  8     Pain Location  Back    Pain Orientation  Lower                      [facility] Adult PT Treatment/Exercise - 01/10/17 0001      Lumbar Exercises: Stretches   Active Hamstring Stretch  2 reps;20 seconds seated    Passive Hamstring Stretch  2 reps;20 seconds with strap      Lumbar Exercises: Seated   Other Seated Lumbar Exercises  foam roll push down to activate lower abdominal muscles 10x    Other Seated Lumbar Exercises  2# plyo ball chops 10x each side      Lumbar Exercises: Supine   Glut Set  -- ball squeeze 10x    Clam  20 reps green band    Other Supine Lumbar Exercises  lumbar rotation 10x    Other Supine Lumbar  Exercises  leg lengthener 5x right/left       Knee/Hip Exercises: Aerobic   Nustep  L1 x 8 min seat 9, arms 10      Moist Heat Therapy   Number Minutes Moist Heat  20 Minutes concurrent with ex    Moist Heat Location  Lumbar Spine               PT Short Term Goals - 01/02/17 1835      PT SHORT TERM GOAL #1   Title  Pt will demo consistency and independence with her HEP to improve LE strength and decrease pain.     Time  4    Period  Weeks    Status  New    Target Date  01/30/17      PT SHORT TERM GOAL #2   Title  Pt will report atleast 25% improvement in her low back pain/activity tolerance from the start of therapy.  Time  4    Period  Weeks      PT SHORT TERM GOAL #3   Title  Pt will demo improved mechanics with transitions sit to/from supine with proper log roll technique without assistance from the therapist, which will decrease strain on her low back.     Time  4    Period  Weeks    Status  New      PT SHORT TERM GOAL #4   Title  Pt will demo improved hip extension ROM to atleast neutral during flexibility testing, which will increase her efficiency with ambulation.     Time  4    Period  Weeks    Status  New        PT Long Term Goals - 01/02/17 1838      PT LONG TERM GOAL #1   Title  Pt will be independent with more advanced HEP to prepare for discharge from PT.     Time  8    Period  Weeks    Status  New    Target Date  02/27/17      PT LONG TERM GOAL #2   Title  Pt will score atleast 50% or less impaired on FOTO to demo increased functional mobility.     Time  8    Period  Weeks    Status  New      PT LONG TERM GOAL #3   Title  Pt will report improved standing tolerance at work to atleast 30 min without increase in her low back pain.     Time  4    Period  Weeks    Status  New      PT LONG TERM GOAL #4   Title  Pt will complete 5x sit to stand in less than 14 sec without UE support, to reflect an improvement in her functional strength.      Time  8    Period  Weeks    Status  New      PT LONG TERM GOAL #5   Title  Pt will complete the TUG in less than 14 sec without UE support, to reflect an improvement in her balance and mobility.     Time  8    Period  Weeks    Status  New      Additional Long Term Goals   Additional Long Term Goals  Yes      PT LONG TERM GOAL #6   Title  Pt will maintain SLS for atleast 10 sec on each LE without LOB, to demonstrate improved hip stability and decrease her risk of falling.     Time  8    Period  Weeks    Status  New            Plan - 01/10/17 1636    Clinical Impression Statement  The patient moving slowly/antalgically.  Her pain intensity remains high but she is able to perform low level exercises.   Moist heat concurrently with some exercises for pain control.  She continues to work long shifts which she reports is hard on her body.      Rehab Potential  Fair    PT Frequency  2x / week    PT Duration  8 weeks    PT Treatment/Interventions  ADLs/Self Care Home Management;Cryotherapy;Traction;Moist Heat;Iontophoresis 4mg /ml Dexamethasone;Electrical Stimulation;Gait training;Neuromuscular re-education;Stair training;Functional mobility training;Balance training;Therapeutic exercise;Therapeutic activities;Patient/family education;Manual techniques;Passive range of motion;Dry needling  PT Next Visit Plan  Trunk strengthenging/gentle ROM, LE strengthening progressions, manual techniques and modalities to address muscle spasm and pain        Patient will benefit from skilled therapeutic intervention in order to improve the following deficits and impairments:  Abnormal gait, Decreased balance, Decreased endurance, Decreased mobility, Difficulty walking, Hypomobility, Increased muscle spasms, Improper body mechanics, Obesity, Decreased range of motion, Decreased strength, Impaired flexibility, Postural dysfunction, Pain  Visit Diagnosis: Chronic bilateral low back pain, with  sciatica presence unspecified  Muscle spasm of back  Muscle weakness (generalized)     Problem List Patient Active Problem List   Diagnosis Date Noted  . 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 Leg edema 09/02/2012  . Varicose veins of lower extremities with other complications 09/02/2012  . Pericarditis 10/05/2010  . Chest pain 06/15/2010  . Edema 06/15/2010  . Thyroid disease 06/15/2010    Lavinia SharpsStacy Simpson, PT 01/10/17 4:51 PM Phone: [phone] Fax: [phone]  Vivien PrestoSimpson, Stacy C 01/10/2017, 4:50 PM  [facility] [address], [address] [place], [place], [address] Phone: [phone]   Fax:  [phone]  Name: Cheryl [name] MRN: [id number] Date of Birth: 02-Nov-1955

## 2017-01-15 ENCOUNTER — Encounter: Payer: BLUE CROSS/BLUE SHIELD | Admitting: Physical Therapy

## 2017-01-17 ENCOUNTER — Ambulatory Visit: Payer: BLUE CROSS/BLUE SHIELD | Admitting: Physical Therapy

## 2017-01-17 DIAGNOSIS — M545 Low back pain: Secondary | ICD-10-CM | POA: Diagnosis not present

## 2017-01-17 DIAGNOSIS — G8929 Other chronic pain: Secondary | ICD-10-CM

## 2017-01-17 DIAGNOSIS — M62830 Muscle spasm of back: Secondary | ICD-10-CM

## 2017-01-17 DIAGNOSIS — M6281 Muscle weakness (generalized): Secondary | ICD-10-CM

## 2017-01-17 NOTE — Therapy (Signed)
[facility] [address], [address] [place], [place], [address] Phone: [phone]   Fax:  [phone]  Physical Therapy Treatment  Patient Details  Name: Cheryl BachelorBrenda Godbey MRN: [id number] Date of Birth: 1955-05-24 Referring Provider: Benedetto GoadFred Wilson, MD    Encounter Date: 01/17/2017  PT End of Session - 01/17/17 1631    Visit Number  4    Number of Visits  16    Date for PT Re-Evaluation  02/27/17    Authorization Type  BCBS    Authorization Time Period  01/02/17 to 02/27/17    PT Start Time  1556    PT Stop Time  1635    PT Time Calculation (min)  39 min    Activity Tolerance  Patient tolerated treatment well       Past Medical History:  Diagnosis Date  . Anemia   . Chest pain   . Chronic dermatitis   . Edema of both legs   . Grave's disease   . HLD (hyperlipidemia)   . HTN (hypertension)   . Obesity   . Varicose veins     Past Surgical History:  Procedure Laterality Date  . CHOLECYSTECTOMY    . ENDOVENOUS ABLATION SAPHENOUS VEIN W/ LASER    . SKIN GRAFT      There were no vitals filed for this visit.  Subjective Assessment - 01/17/17 1553    Subjective  Patient reports she did not have to work Mon or Tuesday b/c of the snow.    Helps when "I don't have to do as much."  I do a little bit then sit back down.  I can tell that stretching my leg before I stand up helps.  Getting a little easier to sleep.      Currently in Pain?  Yes    Pain Score  8     Pain Location  Back    Pain Orientation  Lower    Pain Type  Chronic pain    Aggravating Factors   walking, standing    Pain Relieving Factors  lying down                       [facility] Adult PT Treatment/Exercise - 01/17/17 0001      Lumbar Exercises: Stretches   Passive Hamstring Stretch  2 reps;20 seconds with strap    Lower Trunk Rotation  5 reps;10 seconds      Lumbar Exercises: Seated   Other Seated Lumbar Exercises  foam roll push down to  activate lower abdominal muscles 10x    Other Seated Lumbar Exercises  red band rows 15x, shoulder extensions 15x      Lumbar Exercises: Supine   Glut Set  -- ball squeeze 10x    Clam  20 reps green band    Isometric Hip Flexion  5 reps    Other Supine Lumbar Exercises  --    Other Supine Lumbar Exercises  leg lengthener 5x right/left       Knee/Hip Exercises: Aerobic   Nustep  L1 x 10 min seat 9, arms 10 while discussing progress      Knee/Hip Exercises: Seated   Long Arc Quad  Strengthening;Right;Left;10 reps red band    Knee/Hip Flexion  red band 10x right/left      Moist Heat Therapy   Number Minutes Moist Heat  20 Minutes concurrent with supine ex    Moist Heat Location  Lumbar Spine  PT Short Term Goals - 01/02/17 1835      PT SHORT TERM GOAL #1   Title  Pt will demo consistency and independence with her HEP to improve LE strength and decrease pain.     Time  4    Period  Weeks    Status  New    Target Date  01/30/17      PT SHORT TERM GOAL #2   Title  Pt will report atleast 25% improvement in her low back pain/activity tolerance from the start of therapy.     Time  4    Period  Weeks      PT SHORT TERM GOAL #3   Title  Pt will demo improved mechanics with transitions sit to/from supine with proper log roll technique without assistance from the therapist, which will decrease strain on her low back.     Time  4    Period  Weeks    Status  New      PT SHORT TERM GOAL #4   Title  Pt will demo improved hip extension ROM to atleast neutral during flexibility testing, which will increase her efficiency with ambulation.     Time  4    Period  Weeks    Status  New        PT Long Term Goals - 01/02/17 1838      PT LONG TERM GOAL #1   Title  Pt will be independent with more advanced HEP to prepare for discharge from PT.     Time  8    Period  Weeks    Status  New    Target Date  02/27/17      PT LONG TERM GOAL #2   Title  Pt will score  atleast 50% or less impaired on FOTO to demo increased functional mobility.     Time  8    Period  Weeks    Status  New      PT LONG TERM GOAL #3   Title  Pt will report improved standing tolerance at work to atleast 30 min without increase in her low back pain.     Time  4    Period  Weeks    Status  New      PT LONG TERM GOAL #4   Title  Pt will complete 5x sit to stand in less than 14 sec without UE support, to reflect an improvement in her functional strength.     Time  8    Period  Weeks    Status  New      PT LONG TERM GOAL #5   Title  Pt will complete the TUG in less than 14 sec without UE support, to reflect an improvement in her balance and mobility.     Time  8    Period  Weeks    Status  New      Additional Long Term Goals   Additional Long Term Goals  Yes      PT LONG TERM GOAL #6   Title  Pt will maintain SLS for atleast 10 sec on each LE without LOB, to demonstrate improved hip stability and decrease her risk of falling.     Time  8    Period  Weeks    Status  New            Plan - 01/17/17 1632    Clinical Impression Statement  Exercises modified as  needed primarily for knee pain more than LBP.  Moist heat applied to low back while on Nu-Step and with supine exercises for pain modulation.  Her pain is "not bad" while supine or sitting but increases significantly with sit to stand and while standing.  Will progress exercises to include a little standing over the next few sessions.  Slower progression expected secondary to pain intensity and chronicity in both back and knees.      Rehab Potential  Fair    PT Frequency  2x / week    PT Duration  8 weeks    PT Treatment/Interventions  ADLs/Self Care Home Management;Cryotherapy;Traction;Moist Heat;Iontophoresis 4mg /ml Dexamethasone;Electrical Stimulation;Gait training;Neuromuscular re-education;Stair training;Functional mobility training;Balance training;Therapeutic exercise;Therapeutic activities;Patient/family  education;Manual techniques;Passive range of motion;Dry needling    PT Next Visit Plan  Trunk strengthenging/gentle ROM, LE strengthening progressions, try 1 or 2 low level standing ex (stair step stretch);   modalities to address muscle spasm and pain        Patient will benefit from skilled therapeutic intervention in order to improve the following deficits and impairments:  Abnormal gait, Decreased balance, Decreased endurance, Decreased mobility, Difficulty walking, Hypomobility, Increased muscle spasms, Improper body mechanics, Obesity, Decreased range of motion, Decreased strength, Impaired flexibility, Postural dysfunction, Pain  Visit Diagnosis: Chronic bilateral low back pain, with sciatica presence unspecified  Muscle spasm of back  Muscle weakness (generalized)     Problem List Patient Active Problem List   Diagnosis Date Noted  . 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 Leg edema 09/02/2012  . Varicose veins of lower extremities with other complications 09/02/2012  . Pericarditis 10/05/2010  . Chest pain 06/15/2010  . Edema 06/15/2010  . Thyroid disease 06/15/2010   Lavinia Sharps, PT 01/17/17 4:43 PM Phone: [phone] Fax: [phone]  Vivien Presto 01/17/2017, 4:43 PM  [facility] [address], [address] [place], [place], [address] Phone: [phone]   Fax:  [phone]  Name: Aslin Farinas MRN: [id number] Date of Birth: 02-16-1955

## 2017-01-22 ENCOUNTER — Ambulatory Visit: Payer: BLUE CROSS/BLUE SHIELD | Admitting: Physical Therapy

## 2017-01-22 ENCOUNTER — Encounter: Payer: Self-pay | Admitting: Physical Therapy

## 2017-01-22 DIAGNOSIS — M545 Low back pain: Secondary | ICD-10-CM | POA: Diagnosis not present

## 2017-01-22 DIAGNOSIS — M62830 Muscle spasm of back: Secondary | ICD-10-CM

## 2017-01-22 DIAGNOSIS — G8929 Other chronic pain: Secondary | ICD-10-CM

## 2017-01-22 DIAGNOSIS — M25562 Pain in left knee: Secondary | ICD-10-CM

## 2017-01-22 DIAGNOSIS — M6281 Muscle weakness (generalized): Secondary | ICD-10-CM

## 2017-01-22 NOTE — Therapy (Signed)
[facility] [address], [address] [place], [place], [address] Phone: [phone]   Fax:  [phone]  Physical Therapy Treatment  Patient Details  Name: Cheryl BachelorBrenda [name] MRN: [id number] Date of Birth: May 24, 1955 Referring Provider: Benedetto GoadFred Wilson, MD    Encounter Date: 01/22/2017  PT End of Session - 01/22/17 1620    Visit Number  5    Number of Visits  16    Date for PT Re-Evaluation  02/27/17    Authorization Type  BCBS    Authorization Time Period  01/02/17 to 02/27/17    PT Start Time  1551    PT Stop Time  1629    PT Time Calculation (min)  38 min    Activity Tolerance  Patient tolerated treatment well       Past Medical History:  Diagnosis Date  . Anemia   . Chest pain   . Chronic dermatitis   . Edema of both legs   . Grave's disease   . HLD (hyperlipidemia)   . HTN (hypertension)   . Obesity   . Varicose veins     Past Surgical History:  Procedure Laterality Date  . CHOLECYSTECTOMY    . ENDOVENOUS ABLATION SAPHENOUS VEIN W/ LASER    . SKIN GRAFT      There were no vitals filed for this visit.  Subjective Assessment - 01/22/17 1553    Subjective  My back hurts a little bit down toward my hip.  I didn't work today.      Currently in Pain?  Yes    Pain Score  6     Pain Location  Back    Pain Orientation  Lower    Pain Type  Chronic pain    Aggravating Factors   walking, standing    Pain Relieving Factors  lying down, resting                      [facility] Adult PT Treatment/Exercise - 01/22/17 0001      Lumbar Exercises: Stretches   Active Hamstring Stretch  -- on 2nd step    Lower Trunk Rotation  5 reps with red physioball    Hip Flexor Stretch  5 reps on 2nd step    Quad Stretch  5 reps with red physioball      Lumbar Exercises: Standing   Heel Raises  10 reps      Lumbar Exercises: Seated   Long Arc Quad on Chair  -- 20# rows in chair 15x    Sit to Stand  -- 15# shoulder  extensions 15x    Other Seated Lumbar Exercises  foam roll push down to activate lower abdominal muscles 10x    Other Seated Lumbar Exercises  2# plyo ball chops V 1 minute      Lumbar Exercises: Supine   Other Supine Lumbar Exercises  red physioball pushes 10x isometrics      Lumbar Exercises: Sidelying   Clam  10 reps      Knee/Hip Exercises: Aerobic   Nustep  L1 x 10 min seat 9, arms 10 while discussing progress      Moist Heat Therapy   Number Minutes Moist Heat  15 Minutes concurrent with supine ex    Moist Heat Location  Lumbar Spine               PT Short Term Goals - 01/02/17 1835      PT SHORT TERM GOAL #  1   Title  Pt will demo consistency and independence with her HEP to improve LE strength and decrease pain.     Time  4    Period  Weeks    Status  New    Target Date  01/30/17      PT SHORT TERM GOAL #2   Title  Pt will report atleast 25% improvement in her low back pain/activity tolerance from the start of therapy.     Time  4    Period  Weeks      PT SHORT TERM GOAL #3   Title  Pt will demo improved mechanics with transitions sit to/from supine with proper log roll technique without assistance from the therapist, which will decrease strain on her low back.     Time  4    Period  Weeks    Status  New      PT SHORT TERM GOAL #4   Title  Pt will demo improved hip extension ROM to atleast neutral during flexibility testing, which will increase her efficiency with ambulation.     Time  4    Period  Weeks    Status  New        PT Long Term Goals - 01/02/17 1838      PT LONG TERM GOAL #1   Title  Pt will be independent with more advanced HEP to prepare for discharge from PT.     Time  8    Period  Weeks    Status  New    Target Date  02/27/17      PT LONG TERM GOAL #2   Title  Pt will score atleast 50% or less impaired on FOTO to demo increased functional mobility.     Time  8    Period  Weeks    Status  New      PT LONG TERM GOAL #3    Title  Pt will report improved standing tolerance at work to atleast 30 min without increase in her low back pain.     Time  4    Period  Weeks    Status  New      PT LONG TERM GOAL #4   Title  Pt will complete 5x sit to stand in less than 14 sec without UE support, to reflect an improvement in her functional strength.     Time  8    Period  Weeks    Status  New      PT LONG TERM GOAL #5   Title  Pt will complete the TUG in less than 14 sec without UE support, to reflect an improvement in her balance and mobility.     Time  8    Period  Weeks    Status  New      Additional Long Term Goals   Additional Long Term Goals  Yes      PT LONG TERM GOAL #6   Title  Pt will maintain SLS for atleast 10 sec on each LE without LOB, to demonstrate improved hip stability and decrease her risk of falling.     Time  8    Period  Weeks    Status  New            Plan - 01/22/17 1621    Clinical Impression Statement  The patient reports increased pain in her back and both knees with sit to stand and standing.  Improved  activation of abdominals with min cues to avoid hold her breath.  Continue slower progression secondary to pain intensity and chronicity.      Rehab Potential  Fair    PT Frequency  2x / week    PT Duration  8 weeks    PT Treatment/Interventions  ADLs/Self Care Home Management;Cryotherapy;Traction;Moist Heat;Iontophoresis 4mg /ml Dexamethasone;Electrical Stimulation;Gait training;Neuromuscular re-education;Stair training;Functional mobility training;Balance training;Therapeutic exercise;Therapeutic activities;Patient/family education;Manual techniques;Passive range of motion;Dry needling    PT Next Visit Plan  Trunk strengthenging/gentle ROM, LE strengthening progressions, try 1 or 2 low level standing ex (stair step stretch);   modalities to address muscle spasm and pain check progress with STGs      Patient will benefit from skilled therapeutic intervention in order to improve  the following deficits and impairments:  Abnormal gait, Decreased balance, Decreased endurance, Decreased mobility, Difficulty walking, Hypomobility, Increased muscle spasms, Improper body mechanics, Obesity, Decreased range of motion, Decreased strength, Impaired flexibility, Postural dysfunction, Pain  Visit Diagnosis: Chronic bilateral low back pain, with sciatica presence unspecified  Muscle spasm of back  Muscle weakness (generalized)  Chronic pain of left knee     Problem List Patient Active Problem List   Diagnosis Date Noted  . 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 Leg edema 09/02/2012  . Varicose veins of lower extremities with other complications 09/02/2012  . Pericarditis 10/05/2010  . Chest pain 06/15/2010  . Edema 06/15/2010  . Thyroid disease 06/15/2010   Lavinia Sharps, PT 01/22/17 4:31 PM Phone: [phone] Fax: [phone]  Vivien Presto 01/22/2017, 4:28 PM  [facility] [address] [place], [place], [address] Phone: [phone]   Fax:  [phone]  Name: Branden Vine MRN: [id number] Date of Birth: 11-27-1955

## 2017-01-24 ENCOUNTER — Ambulatory Visit: Payer: BLUE CROSS/BLUE SHIELD | Admitting: Physical Therapy

## 2017-01-24 ENCOUNTER — Encounter: Payer: Self-pay | Admitting: Physical Therapy

## 2017-01-24 DIAGNOSIS — M545 Low back pain: Secondary | ICD-10-CM | POA: Diagnosis not present

## 2017-01-24 DIAGNOSIS — M6281 Muscle weakness (generalized): Secondary | ICD-10-CM

## 2017-01-24 DIAGNOSIS — M62830 Muscle spasm of back: Secondary | ICD-10-CM

## 2017-01-24 DIAGNOSIS — G8929 Other chronic pain: Secondary | ICD-10-CM

## 2017-01-24 DIAGNOSIS — M25562 Pain in left knee: Secondary | ICD-10-CM

## 2017-01-24 NOTE — Therapy (Signed)
[facility] [address] [place], [name], [address] Phone: [phone]   Fax:  [phone]  Physical Therapy Treatment  Patient Details  Name: Cheryl [name] MRN: [id number] Date of Birth: 1955/11/22 Referring Provider: Kathryne Eriksson, MD    Encounter Date: 01/24/2017  PT End of Session - 01/24/17 1604    Visit Number  6    Number of Visits  16    Date for PT Re-Evaluation  02/27/17    Authorization Type  BCBS    Authorization Time Period  01/02/17 to 02/27/17    PT Start Time  1555    PT Stop Time  1635    PT Time Calculation (min)  40 min    Activity Tolerance  Patient tolerated treatment well       Past Medical History:  Diagnosis Date  . Anemia   . Chest pain   . Chronic dermatitis   . Edema of both legs   . Grave's disease   . HLD (hyperlipidemia)   . HTN (hypertension)   . Obesity   . Varicose veins     Past Surgical History:  Procedure Laterality Date  . CHOLECYSTECTOMY    . ENDOVENOUS ABLATION SAPHENOUS VEIN W/ LASER    . SKIN GRAFT      There were no vitals filed for this visit.  Subjective Assessment - 01/24/17 1557    Subjective  My foot hurts today.  I was so sore after the last time I was here.  Patient states she can't understand while she is so sore b/c she has done therapy before and at the gym but "it's been years."   I'm mostly sore in my calves.  I took 2 Advil before I came.  I'm on light duty at work.  Patient states she does think PT has helped her knees.  "I can get up better."  She feels she is better but is unable to give a % improvement.    Pertinent History  chest pain, HTN  MD 12/26    Currently in Pain?  Yes    Pain Score  7     Pain Location  Back    Pain Orientation  Lower    Aggravating Factors   standing                      [facility] Adult PT Treatment/Exercise - 01/24/17 0001      Lumbar Exercises: Stretches   Lower Trunk Rotation  5 reps with red  physioball    Hip Flexor Stretch  5 reps on 2nd step    Quad Stretch  5 reps with red physioball      Lumbar Exercises: Standing   Functional Squats  -- step taps 5x right/left    Wall Slides  -- step out and in 5x to each side    Shoulder Extension  -- hip extension 5x right/left    Other Standing Lumbar Exercises  hip abduction 5x right/left    Other Standing Lumbar Exercises  anterior/posterior sway 10x      Lumbar Exercises: Seated   Other Seated Lumbar Exercises  green band clams 20x    Other Seated Lumbar Exercises  red band rows 15x, shoulder extensions 15x      Lumbar Exercises: Supine   Other Supine Lumbar Exercises  marching 10x    Other Supine Lumbar Exercises  ball squeeze 10x      Lumbar Exercises: Sidelying  Clam  10 reps      Knee/Hip Exercises: Aerobic   Nustep  L1 x 10 min seat 9, arms 10 while discussing progress      Knee/Hip Exercises: Seated   Clamshell with TheraBand  Green    Other Seated Knee/Hip Exercises  quad sets sitting on 2 foam pads on chair 10x    Other Seated Knee/Hip Exercises  2# plyo ball V chops 10x while sitting on 2 foam pads               PT Short Term Goals - 01/24/17 1603      PT SHORT TERM GOAL #1   Title  Pt will demo consistency and independence with her HEP to improve LE strength and decrease pain.     Status  Achieved      PT SHORT TERM GOAL #2   Title  Pt will report atleast 25% improvement in her low back pain/activity tolerance from the start of therapy.     Time  4    Period  Weeks    Status  On-going      PT SHORT TERM GOAL #3   Title  Pt will demo improved mechanics with transitions sit to/from supine with proper log roll technique without assistance from the therapist, which will decrease strain on her low back.     Time  4    Period  Weeks    Status  On-going      PT SHORT TERM GOAL #4   Title  Pt will demo improved hip extension ROM to atleast neutral during flexibility testing, which will increase her  efficiency with ambulation.     Status  Achieved        PT Long Term Goals - 01/02/17 1838      PT LONG TERM GOAL #1   Title  Pt will be independent with more advanced HEP to prepare for discharge from PT.     Time  8    Period  Weeks    Status  New    Target Date  02/27/17      PT LONG TERM GOAL #2   Title  Pt will score atleast 50% or less impaired on FOTO to demo increased functional mobility.     Time  8    Period  Weeks    Status  New      PT LONG TERM GOAL #3   Title  Pt will report improved standing tolerance at work to atleast 30 min without increase in her low back pain.     Time  4    Period  Weeks    Status  New      PT LONG TERM GOAL #4   Title  Pt will complete 5x sit to stand in less than 14 sec without UE support, to reflect an improvement in her functional strength.     Time  8    Period  Weeks    Status  New      PT LONG TERM GOAL #5   Title  Pt will complete the TUG in less than 14 sec without UE support, to reflect an improvement in her balance and mobility.     Time  8    Period  Weeks    Status  New      Additional Long Term Goals   Additional Long Term Goals  Yes      PT LONG TERM GOAL #6   Title  Pt will maintain SLS for atleast 10 sec on each LE without LOB, to demonstrate improved hip stability and decrease her risk of falling.     Time  8    Period  Weeks    Status  New            Plan - 01/24/17 1634    Clinical Impression Statement  The patient is able to participate in a slight progression of standing exercises but avoided calf raises and excessive abdominal activation ex's secondary to her complaints of soreness last visit.  She does state she knows PT is helping b/c she can get up and down easier and her knees are more mobile.  The arthritic pain in her knees are a limiting factor with sit to stand and she is limited in standing tolerance secondary to knee pain.   Partial STGs met.  She cancelled her next appt to spend more time  with her family over the holiday and will follow up the week after.      Rehab Potential  Fair    PT Frequency  2x / week    PT Duration  8 weeks    PT Treatment/Interventions  ADLs/Self Care Home Management;Cryotherapy;Traction;Moist Heat;Iontophoresis 41m/ml Dexamethasone;Electrical Stimulation;Gait training;Neuromuscular re-education;Stair training;Functional mobility training;Balance training;Therapeutic exercise;Therapeutic activities;Patient/family education;Manual techniques;Passive range of motion;Dry needling    PT Next Visit Plan  Trunk strengthening/gentle ROM, LE strengthening progressions,  low level standing ex;  seated on 2 foam cushions for ex;   modalities to address pain;  See how MD appt went      Patient will benefit from skilled therapeutic intervention in order to improve the following deficits and impairments:  Abnormal gait, Decreased balance, Decreased endurance, Decreased mobility, Difficulty walking, Hypomobility, Increased muscle spasms, Improper body mechanics, Obesity, Decreased range of motion, Decreased strength, Impaired flexibility, Postural dysfunction, Pain  Visit Diagnosis: Chronic bilateral low back pain, with sciatica presence unspecified  Muscle spasm of back  Muscle weakness (generalized)  Chronic pain of left knee     Problem List Patient Active Problem List   Diagnosis Date Noted  . Varicose veins of leg with complications 040/34/7425 . Leg edema 09/02/2012  . Varicose veins of lower extremities with other complications 095/63/8756 . Pericarditis 10/05/2010  . Chest pain 06/15/2010  . Edema 06/15/2010  . Thyroid disease 06/15/2010    SAlvera Singh12/20/2018, 4:44 PM  [facility] [address] [place] [address]Phone: [phone]  Fax:  [phone] Name: BSamera MacyMRN: [id number] of Birth: [birth year]

## 2017-01-31 ENCOUNTER — Encounter: Payer: BLUE CROSS/BLUE SHIELD | Admitting: Physical Therapy

## 2017-02-07 ENCOUNTER — Ambulatory Visit: Payer: BLUE CROSS/BLUE SHIELD | Attending: Family Medicine | Admitting: Physical Therapy

## 2017-02-07 ENCOUNTER — Encounter: Payer: Self-pay | Admitting: Physical Therapy

## 2017-02-07 DIAGNOSIS — G8929 Other chronic pain: Secondary | ICD-10-CM

## 2017-02-07 DIAGNOSIS — M6281 Muscle weakness (generalized): Secondary | ICD-10-CM

## 2017-02-07 DIAGNOSIS — M545 Low back pain: Secondary | ICD-10-CM | POA: Insufficient documentation

## 2017-02-07 DIAGNOSIS — M62830 Muscle spasm of back: Secondary | ICD-10-CM | POA: Diagnosis present

## 2017-02-07 DIAGNOSIS — M25562 Pain in left knee: Secondary | ICD-10-CM | POA: Insufficient documentation

## 2017-02-07 NOTE — Therapy (Signed)
[facility] [address] [place], [place], [address] Phone: [phone]   Fax:  [phone]  Physical Therapy Treatment  Patient Details  Name: Cheryl BachelorBrenda [name] MRN: [id number] Date of Birth: 06/14/1955 Referring Provider: Benedetto GoadFred Wilson, MD    Encounter Date: 02/07/2017  PT End of Session - 02/07/17 1636    Visit Number  7    Number of Visits  16    Date for PT Re-Evaluation  02/27/17    Authorization Type  BCBS    Authorization Time Period  01/02/17 to 02/27/17    PT Start Time  1520    PT Stop Time  1605 part of treatment unsupervised,pt came very early    PT Time Calculation (min)  45 min    Activity Tolerance  Patient limited by pain       Past Medical History:  Diagnosis Date  . Anemia   . Chest pain   . Chronic dermatitis   . Edema of both legs   . Grave's disease   . HLD (hyperlipidemia)   . HTN (hypertension)   . Obesity   . Varicose veins     Past Surgical History:  Procedure Laterality Date  . CHOLECYSTECTOMY    . ENDOVENOUS ABLATION SAPHENOUS VEIN W/ LASER    . SKIN GRAFT      There were no vitals filed for this visit.  Subjective Assessment - 02/07/17 1526    Subjective  I tried to rest and stretch it.  I've been back to work for 2 days and I can hardly move.  With walking the pain in my back and knees is worsened.  Patient arrives > 1 hour before appt time.      Currently in Pain?  Yes    Pain Score  5     Pain Location  Back    Pain Type  Chronic pain    Aggravating Factors   standing, walking    Pain Relieving Factors  lying down, resting                      [facility] Adult PT Treatment/Exercise - 02/07/17 0001      Lumbar Exercises: Stretches   Lower Trunk Rotation  5 reps with red physioball    Hip Flexor Stretch  5 reps on 2nd step      Lumbar Exercises: Standing   Functional Squats  -- step taps 5x right/left    Wall Slides  -- step out and in 5x to each side    Shoulder Extension  -- hip extension 5x right/left    Other Standing Lumbar Exercises  hip abduction 5x right/left    Other Standing Lumbar Exercises  anterior/posterior sway 10x      Lumbar Exercises: Seated   Other Seated Lumbar Exercises  green band clams 20x    Other Seated Lumbar Exercises  red band rows 15x, shoulder extensions 15x      Lumbar Exercises: Supine   Other Supine Lumbar Exercises  marching 10x    Other Supine Lumbar Exercises  ball squeeze 10x      Knee/Hip Exercises: Aerobic   Nustep  L1 x 10 min seat 9, arms 10 while discussing progress;  pt did extra time unsupervised               PT Short Term Goals - 01/24/17 1603      PT SHORT TERM GOAL #1   Title  Pt will  demo consistency and independence with her HEP to improve LE strength and decrease pain.     Status  Achieved      PT SHORT TERM GOAL #2   Title  Pt will report atleast 25% improvement in her low back pain/activity tolerance from the start of therapy.     Time  4    Period  Weeks    Status  On-going      PT SHORT TERM GOAL #3   Title  Pt will demo improved mechanics with transitions sit to/from supine with proper log roll technique without assistance from the therapist, which will decrease strain on her low back.     Time  4    Period  Weeks    Status  On-going      PT SHORT TERM GOAL #4   Title  Pt will demo improved hip extension ROM to atleast neutral during flexibility testing, which will increase her efficiency with ambulation.     Status  Achieved        PT Long Term Goals - 01/02/17 1838      PT LONG TERM GOAL #1   Title  Pt will be independent with more advanced HEP to prepare for discharge from PT.     Time  8    Period  Weeks    Status  New    Target Date  02/27/17      PT LONG TERM GOAL #2   Title  Pt will score atleast 50% or less impaired on FOTO to demo increased functional mobility.     Time  8    Period  Weeks    Status  New      PT LONG TERM GOAL #3   Title   Pt will report improved standing tolerance at work to atleast 30 min without increase in her low back pain.     Time  4    Period  Weeks    Status  New      PT LONG TERM GOAL #4   Title  Pt will complete 5x sit to stand in less than 14 sec without UE support, to reflect an improvement in her functional strength.     Time  8    Period  Weeks    Status  New      PT LONG TERM GOAL #5   Title  Pt will complete the TUG in less than 14 sec without UE support, to reflect an improvement in her balance and mobility.     Time  8    Period  Weeks    Status  New      Additional Long Term Goals   Additional Long Term Goals  Yes      PT LONG TERM GOAL #6   Title  Pt will maintain SLS for atleast 10 sec on each LE without LOB, to demonstrate improved hip stability and decrease her risk of falling.     Time  8    Period  Weeks    Status  New            Plan - 02/07/17 1637    Clinical Impression Statement  The patient has stiff leg gait and difficulty with sit to stand secondary to her back and bil knee pain.  She is able to participate in low level hip/lumbo/pelvic core strengthening exercises with some complaints of pain in multi body regions but not of an intensity where she needs to  stop.  Progress will be slowed secondary to multi region pain and chronicity.  Continue 2 more weeks to promote independence with HEP.    Rehab Potential  Fair    PT Frequency  2x / week    PT Duration  8 weeks    PT Treatment/Interventions  ADLs/Self Care Home Management;Cryotherapy;Traction;Moist Heat;Iontophoresis 4mg /ml Dexamethasone;Electrical Stimulation;Gait training;Neuromuscular re-education;Stair training;Functional mobility training;Balance training;Therapeutic exercise;Therapeutic activities;Patient/family education;Manual techniques;Passive range of motion;Dry needling    PT Next Visit Plan  Trunk strengthening/gentle ROM, LE strengthening progressions,  low level standing ex;  seated on 2 foam  cushions for ex;   modalities to address pain        Patient will benefit from skilled therapeutic intervention in order to improve the following deficits and impairments:  Abnormal gait, Decreased balance, Decreased endurance, Decreased mobility, Difficulty walking, Hypomobility, Increased muscle spasms, Improper body mechanics, Obesity, Decreased range of motion, Decreased strength, Impaired flexibility, Postural dysfunction, Pain  Visit Diagnosis: Chronic bilateral low back pain, with sciatica presence unspecified  Muscle spasm of back  Muscle weakness (generalized)  Chronic pain of left knee     Problem List Patient Active Problem List   Diagnosis Date Noted  . 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 Leg edema 09/02/2012  . Varicose veins of lower extremities with other complications 09/02/2012  . Pericarditis 10/05/2010  . Chest pain 06/15/2010  . Edema 06/15/2010  . Thyroid disease 06/15/2010   Lavinia Sharps, PT 02/07/17 4:45 PM Phone: [phone] Fax: [phone]  Vivien Presto 02/07/2017, 4:44 PM  [facility] [address], [address] [place], [place], [address] Phone: [phone]   Fax:  [phone]  Name: Cheryl [name] MRN: [id number] Date of Birth: [birth year]

## 2017-02-12 ENCOUNTER — Ambulatory Visit: Payer: BLUE CROSS/BLUE SHIELD | Admitting: Physical Therapy

## 2017-02-12 DIAGNOSIS — M6281 Muscle weakness (generalized): Secondary | ICD-10-CM

## 2017-02-12 DIAGNOSIS — M545 Low back pain: Secondary | ICD-10-CM | POA: Diagnosis not present

## 2017-02-12 DIAGNOSIS — M25562 Pain in left knee: Secondary | ICD-10-CM

## 2017-02-12 DIAGNOSIS — M62830 Muscle spasm of back: Secondary | ICD-10-CM

## 2017-02-12 DIAGNOSIS — G8929 Other chronic pain: Secondary | ICD-10-CM

## 2017-02-12 NOTE — Therapy (Signed)
[facility] [address] [place], [place], [address] Phone: [phone]   Fax:  [phone]  Physical Therapy Treatment  Patient Details  Name: Cheryl [name] MRN: [id number] Date of Birth: [birth year] Referring Provider: Benedetto Goad, MD    Encounter Date: 02/12/2017  PT End of Session - 02/12/17 1651    Visit Number  8    Number of Visits  16    Date for PT Re-Evaluation  02/27/17    Authorization Type  BCBS    Authorization Time Period  01/02/17 to 02/27/17    PT Start Time  1600    PT Stop Time  1645    PT Time Calculation (min)  45 min    Activity Tolerance  Patient tolerated treatment well       Past Medical History:  Diagnosis Date  . Anemia   . Chest pain   . Chronic dermatitis   . Edema of both legs   . Grave's disease   . HLD (hyperlipidemia)   . HTN (hypertension)   . Obesity   . Varicose veins     Past Surgical History:  Procedure Laterality Date  . CHOLECYSTECTOMY    . ENDOVENOUS ABLATION SAPHENOUS VEIN W/ LASER    . SKIN GRAFT      There were no vitals filed for this visit.  Subjective Assessment - 02/12/17 1621    Subjective  Patient arrives > 1 hour early.  No pain with sitting but hurts with standing or walking.  Painful at night affecting sleep but better than it was.  I do think my knee is getting straighter and my foot doesn't hurt as much getting in/out of the car.      Currently in Pain?  Yes    Pain Score  4     Pain Location  Back    Pain Orientation  Lower    Pain Relieving Factors  sitting    Multiple Pain Sites  Yes back of thighs; right lateral calf                      [facility] Adult PT Treatment/Exercise - 02/12/17 0001      Lumbar Exercises: Stretches   Lower Trunk Rotation  5 reps with red physioball    Quad Stretch  5 reps with red physioball      Lumbar Exercises: Standing   Functional Squats  -- step taps 5x right/left    Other Standing Lumbar  Exercises  hip abduction 5x right/left    Other Standing Lumbar Exercises  anterior/posterior sway 10x      Lumbar Exercises: Seated   Other Seated Lumbar Exercises  seated rocker board 2 min    Other Seated Lumbar Exercises  red ball in lap push down for transverse abdominus activation 10x      Lumbar Exercises: Supine   Clam  20 reps green band    Other Supine Lumbar Exercises  UE chops 10x each side    Other Supine Lumbar Exercises  ball squeezes 20x      Knee/Hip Exercises: Aerobic   Nustep  L1 10 min       Knee/Hip Exercises: Seated   Other Seated Knee/Hip Exercises  green band leg press 10x each side     Sit to Sand  5 reps from very high table       Knee/Hip Exercises: Supine   Heel Slides  AROM;Right;Left;10 reps  PT Short Term Goals - 01/24/17 1603      PT SHORT TERM GOAL #1   Title  Pt will demo consistency and independence with her HEP to improve LE strength and decrease pain.     Status  Achieved      PT SHORT TERM GOAL #2   Title  Pt will report atleast 25% improvement in her low back pain/activity tolerance from the start of therapy.     Time  4    Period  Weeks    Status  On-going      PT SHORT TERM GOAL #3   Title  Pt will demo improved mechanics with transitions sit to/from supine with proper log roll technique without assistance from the therapist, which will decrease strain on her low back.     Time  4    Period  Weeks    Status  On-going      PT SHORT TERM GOAL #4   Title  Pt will demo improved hip extension ROM to atleast neutral during flexibility testing, which will increase her efficiency with ambulation.     Status  Achieved        PT Long Term Goals - 01/02/17 1838      PT LONG TERM GOAL #1   Title  Pt will be independent with more advanced HEP to prepare for discharge from PT.     Time  8    Period  Weeks    Status  New    Target Date  02/27/17      PT LONG TERM GOAL #2   Title  Pt will score atleast 50% or  less impaired on FOTO to demo increased functional mobility.     Time  8    Period  Weeks    Status  New      PT LONG TERM GOAL #3   Title  Pt will report improved standing tolerance at work to atleast 30 min without increase in her low back pain.     Time  4    Period  Weeks    Status  New      PT LONG TERM GOAL #4   Title  Pt will complete 5x sit to stand in less than 14 sec without UE support, to reflect an improvement in her functional strength.     Time  8    Period  Weeks    Status  New      PT LONG TERM GOAL #5   Title  Pt will complete the TUG in less than 14 sec without UE support, to reflect an improvement in her balance and mobility.     Time  8    Period  Weeks    Status  New      Additional Long Term Goals   Additional Long Term Goals  Yes      PT LONG TERM GOAL #6   Title  Pt will maintain SLS for atleast 10 sec on each LE without LOB, to demonstrate improved hip stability and decrease her risk of falling.     Time  8    Period  Weeks    Status  New            Plan - 02/12/17 1652    Clinical Impression Statement  The patient reports gains in PT with decreases in back and knee pain intensity although she continues to have moderate pain.  Palpable and audible right knee crepitus  with sit to stand even from a very high table.  Multiple body regions affected which slows overall improvements but she is able to stand for longer periods of time.      Rehab Potential  Fair    PT Frequency  2x / week    PT Duration  8 weeks    PT Treatment/Interventions  ADLs/Self Care Home Management;Cryotherapy;Traction;Moist Heat;Iontophoresis 4mg /ml Dexamethasone;Electrical Stimulation;Gait training;Neuromuscular re-education;Stair training;Functional mobility training;Balance training;Therapeutic exercise;Therapeutic activities;Patient/family education;Manual techniques;Passive range of motion;Dry needling    PT Next Visit Plan  Trunk strengthening/gentle ROM, LE strengthening  progressions,  low level standing ex;  seated on 2 foam cushions for ex;   modalities to address pain     Recommended Other Services  PT cert re routed to MD for signature       Patient will benefit from skilled therapeutic intervention in order to improve the following deficits and impairments:  Abnormal gait, Decreased balance, Decreased endurance, Decreased mobility, Difficulty walking, Hypomobility, Increased muscle spasms, Improper body mechanics, Obesity, Decreased range of motion, Decreased strength, Impaired flexibility, Postural dysfunction, Pain  Visit Diagnosis: Chronic bilateral low back pain, with sciatica presence unspecified  Muscle spasm of back  Muscle weakness (generalized)  Chronic pain of left knee     Problem List Patient Active Problem List   Diagnosis Date Noted  . 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 Leg edema 09/02/2012  . Varicose veins of lower extremities with other complications 09/02/2012  . Pericarditis 10/05/2010  . Chest pain 06/15/2010  . Edema 06/15/2010  . Thyroid disease 06/15/2010   Lavinia SharpsStacy Esaias Cleavenger, PT 02/12/17 5:03 PM Phone: [phone] Fax: [phone]  Vivien PrestoSimpson, Nashya Garlington C 02/12/2017, Deno Etienne5:02 PM  [facility] [address], [address] [place], [place], [address] Phone: [phone]   Fax:  [phone]  Name: Keenan BachelorBrenda Schnorr MRN: [id number] Date of Birth: 03-02-1955

## 2017-02-14 ENCOUNTER — Ambulatory Visit: Payer: BLUE CROSS/BLUE SHIELD | Admitting: Physical Therapy

## 2017-02-14 DIAGNOSIS — M6281 Muscle weakness (generalized): Secondary | ICD-10-CM

## 2017-02-14 DIAGNOSIS — G8929 Other chronic pain: Secondary | ICD-10-CM

## 2017-02-14 DIAGNOSIS — M62830 Muscle spasm of back: Secondary | ICD-10-CM

## 2017-02-14 DIAGNOSIS — M545 Low back pain: Secondary | ICD-10-CM | POA: Diagnosis not present

## 2017-02-14 DIAGNOSIS — M25562 Pain in left knee: Secondary | ICD-10-CM

## 2017-02-14 NOTE — Therapy (Signed)
[facility] [address] [place], [place], [address] Phone: [phone]   Fax:  [phone]  Physical Therapy Treatment  Patient Details  Name: Cheryl [name] MRN: [id number] Date of Birth: Oct 07, 1955 Referring Provider: Benedetto Goad, MD    Encounter Date: 02/14/2017  PT End of Session - 02/14/17 1603    Visit Number  9    Number of Visits  16    Date for PT Re-Evaluation  02/27/17    Authorization Type  BCBS    Authorization Time Period  01/02/17 to 02/27/17    PT Start Time  1520    PT Stop Time  1605    PT Time Calculation (min)  45 min    Activity Tolerance  Patient tolerated treatment well       Past Medical History:  Diagnosis Date  . Anemia   . Chest pain   . Chronic dermatitis   . Edema of both legs   . Grave's disease   . HLD (hyperlipidemia)   . HTN (hypertension)   . Obesity   . Varicose veins     Past Surgical History:  Procedure Laterality Date  . CHOLECYSTECTOMY    . ENDOVENOUS ABLATION SAPHENOUS VEIN W/ LASER    . SKIN GRAFT      There were no vitals filed for this visit.  Subjective Assessment - 02/14/17 1531    Subjective  Patient arrives > 1 hour early.  States she's been hurting since Tuesday she thinks from the standing exercises.  Pain is in the back and the backs of her knees.  She also worked 10 hours yesterday and 8 hours today.      Currently in Pain?  Yes    Pain Score  7     Pain Location  Back    Pain Orientation  Lower    Pain Type  Chronic pain                      [facility] Adult PT Treatment/Exercise - 02/14/17 0001      Lumbar Exercises: Seated   Sit to Stand  -- seated rocker board     Other Seated Lumbar Exercises  foam roll push down to activate lower abdominal muscles 10x    Other Seated Lumbar Exercises  yellow band rows 15x, shoulder extensions 15x      Lumbar Exercises: Supine   Clam  20 reps blue band    Other Supine Lumbar Exercises   marching 10x    Other Supine Lumbar Exercises  ball squeezes 20x      Knee/Hip Exercises: Aerobic   Nustep  L1 seat 11, arms 12 10 min with moist heat      Knee/Hip Exercises: Seated   Other Seated Knee/Hip Exercises  quad sets sitting on 2 foam pads on chair 10x      Knee/Hip Exercises: Supine   Other Supine Knee/Hip Exercises  green ball for hip/knee flexion 10x    Other Supine Knee/Hip Exercises  lumbar rotation with green ball 10x each side      Moist Heat Therapy   Number Minutes Moist Heat  15 Minutes concurrent with supine ex    Moist Heat Location  Lumbar Spine               PT Short Term Goals - 02/14/17 1613      PT SHORT TERM GOAL #1   Title  Pt will demo consistency and independence  with her HEP to improve LE strength and decrease pain.     Status  Achieved      PT SHORT TERM GOAL #2   Title  Pt will report atleast 25% improvement in her low back pain/activity tolerance from the start of therapy.     Time  4    Period  Weeks    Status  On-going      PT SHORT TERM GOAL #3   Title  Pt will demo improved mechanics with transitions sit to/from supine with proper log roll technique without assistance from the therapist, which will decrease strain on her low back.     Status  Achieved      PT SHORT TERM GOAL #4   Title  Pt will demo improved hip extension ROM to atleast neutral during flexibility testing, which will increase her efficiency with ambulation.     Status  Achieved        PT Long Term Goals - 02/14/17 1613      PT LONG TERM GOAL #1   Title  Pt will be independent with more advanced HEP to prepare for discharge from PT.     Time  8    Period  Weeks    Status  On-going      PT LONG TERM GOAL #2   Title  Pt will score atleast 50% or less impaired on FOTO to demo increased functional mobility.     Period  Weeks    Status  On-going      PT LONG TERM GOAL #3   Title  Pt will report improved standing tolerance at work to atleast 30 min  without increase in her low back pain.     Time  4    Period  Weeks    Status  On-going      PT LONG TERM GOAL #4   Title  Pt will complete 5x sit to stand in less than 14 sec without UE support, to reflect an improvement in her functional strength.     Time  8    Period  Weeks    Status  On-going      PT LONG TERM GOAL #5   Title  Pt will complete the TUG in less than 14 sec without UE support, to reflect an improvement in her balance and mobility.     Time  8    Period  Weeks    Status  On-going      PT LONG TERM GOAL #6   Title  Pt will maintain SLS for atleast 10 sec on each LE without LOB, to demonstrate improved hip stability and decrease her risk of falling.     Time  8    Period  Weeks    Status  On-going            Plan - 02/14/17 1603    Clinical Impression Statement  Treatment modified to avoid standing exercises today secondary to her increase in pain intensity following last session's standing exercises and her long work hours this week.  Therapist closely monitoring pain and response with all.  Will continue to promote indepedence with HEP in preparation for discharge in 2 weeks.      Rehab Potential  Fair    PT Frequency  2x / week    PT Duration  8 weeks    PT Treatment/Interventions  ADLs/Self Care Home Management;Cryotherapy;Traction;Moist Heat;Iontophoresis 4mg /ml Dexamethasone;Electrical Stimulation;Gait training;Neuromuscular re-education;Stair training;Functional mobility training;Balance training;Therapeutic exercise;Therapeutic  activities;Patient/family education;Manual techniques;Passive range of motion;Dry needling    PT Next Visit Plan  Trunk strengthening/gentle ROM, LE strengthening progressions,  low level standing ex;  seated on 2 foam cushions for ex;   modalities to address pain        Patient will benefit from skilled therapeutic intervention in order to improve the following deficits and impairments:  Abnormal gait, Decreased balance,  Decreased endurance, Decreased mobility, Difficulty walking, Hypomobility, Increased muscle spasms, Improper body mechanics, Obesity, Decreased range of motion, Decreased strength, Impaired flexibility, Postural dysfunction, Pain  Visit Diagnosis: Chronic bilateral low back pain, with sciatica presence unspecified  Muscle spasm of back  Muscle weakness (generalized)  Chronic pain of left knee     Problem List Patient Active Problem List   Diagnosis Date Noted  . 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 Leg edema 09/02/2012  . Varicose veins of lower extremities with other complications 09/02/2012  . Pericarditis 10/05/2010  . Chest pain 06/15/2010  . Edema 06/15/2010  . Thyroid disease 06/15/2010   Lavinia Sharps, PT 02/14/17 4:15 PM Phone: [phone] Fax: [phone]  Vivien Presto 02/14/2017, 4:15 PM  [facility] [address] [place], [place], [address] Phone: [phone]   Fax:  [phone]  Name: Chenille Toor MRN: [id number] Date of Birth: [birth year]

## 2017-02-19 ENCOUNTER — Ambulatory Visit: Payer: BLUE CROSS/BLUE SHIELD | Admitting: Physical Therapy

## 2017-02-19 DIAGNOSIS — G8929 Other chronic pain: Secondary | ICD-10-CM

## 2017-02-19 DIAGNOSIS — M25562 Pain in left knee: Secondary | ICD-10-CM

## 2017-02-19 DIAGNOSIS — M545 Low back pain: Principal | ICD-10-CM

## 2017-02-19 DIAGNOSIS — M6281 Muscle weakness (generalized): Secondary | ICD-10-CM

## 2017-02-19 DIAGNOSIS — M62830 Muscle spasm of back: Secondary | ICD-10-CM

## 2017-02-19 NOTE — Therapy (Signed)
[facility] [address] [place], [place], [address] Phone: [phone]   Fax:  [phone]  Physical Therapy Treatment  Patient Details  Name: Cheryl [name] MRN: [id number] Date of Birth: 1955/10/24 Referring Provider: Benedetto Goad, MD    Encounter Date: 02/19/2017  PT End of Session - 02/19/17 1537    Visit Number  10    Number of Visits  16    Date for PT Re-Evaluation  02/27/17    Authorization Type  BCBS    Authorization Time Period  01/02/17 to 02/27/17    PT Start Time  1507    PT Stop Time  1545    PT Time Calculation (min)  38 min    Activity Tolerance  Patient tolerated treatment well       Past Medical History:  Diagnosis Date  . Anemia   . Chest pain   . Chronic dermatitis   . Edema of both legs   . Grave's disease   . HLD (hyperlipidemia)   . HTN (hypertension)   . Obesity   . Varicose veins     Past Surgical History:  Procedure Laterality Date  . CHOLECYSTECTOMY    . ENDOVENOUS ABLATION SAPHENOUS VEIN W/ LASER    . SKIN GRAFT      There were no vitals filed for this visit.  Subjective Assessment - 02/19/17 1510    Subjective  I've been hurting ever since.  I don't see the doctor until March.  "I'm hoping he will do some tests."  "I don't want knee replacements."  "I think I can get up and down easier and I know I can straighten out my knee better now."    Currently in Pain?  Yes    Pain Score  8     Pain Location  Back    Pain Type  Chronic pain    Aggravating Factors   standing, walking    Pain Relieving Factors  sitting    Pain Score  6    Pain Location  Knee    Pain Type  Chronic pain         [facility] PT Assessment - 02/19/17 0001      Observation/Other Assessments   Focus on Therapeutic Outcomes (FOTO)   64% limitation                   [facility] Adult PT Treatment/Exercise - 02/19/17 0001      Lumbar Exercises: Seated   Other Seated Lumbar Exercises  foam roll  push down to activate lower abdominal muscles 10x    Other Seated Lumbar Exercises  seated ankle rocker board       Knee/Hip Exercises: Aerobic   Nustep  L1 10 min       Knee/Hip Exercises: Seated   Ball Squeeze  10x    Other Seated Knee/Hip Exercises  quad sets sitting on 2 foam pads on chair 10x      Knee/Hip Exercises: Supine   Bridges with Clamshell  -- red band clams double and single 10x each no bridge    Straight Leg Raises  AROM;Right;Left;5 reps from wedge     Other Supine Knee/Hip Exercises  green ball for hip/knee flexion 10x    Other Supine Knee/Hip Exercises  lumbar rotation with green ball 10x each side      Moist Heat Therapy   Number Minutes Moist Heat  15 Minutes concurrent with ex    Moist Heat Location  Lumbar Spine             PT Education - 02/19/17 1556    Education provided  Yes    Education Details  info on LE wedges for comfort    Person(s) Educated  Patient    Methods  Explanation;Handout    Comprehension  Verbalized understanding       PT Short Term Goals - 02/14/17 1613      PT SHORT TERM GOAL #1   Title  Pt will demo consistency and independence with her HEP to improve LE strength and decrease pain.     Status  Achieved      PT SHORT TERM GOAL #2   Title  Pt will report atleast 25% improvement in her low back pain/activity tolerance from the start of therapy.     Time  4    Period  Weeks    Status  On-going      PT SHORT TERM GOAL #3   Title  Pt will demo improved mechanics with transitions sit to/from supine with proper log roll technique without assistance from the therapist, which will decrease strain on her low back.     Status  Achieved      PT SHORT TERM GOAL #4   Title  Pt will demo improved hip extension ROM to atleast neutral during flexibility testing, which will increase her efficiency with ambulation.     Status  Achieved        PT Long Term Goals - 02/14/17 1613      PT LONG TERM GOAL #1   Title  Pt will be  independent with more advanced HEP to prepare for discharge from PT.     Time  8    Period  Weeks    Status  On-going      PT LONG TERM GOAL #2   Title  Pt will score atleast 50% or less impaired on FOTO to demo increased functional mobility.     Period  Weeks    Status  On-going      PT LONG TERM GOAL #3   Title  Pt will report improved standing tolerance at work to atleast 30 min without increase in her low back pain.     Time  4    Period  Weeks    Status  On-going      PT LONG TERM GOAL #4   Title  Pt will complete 5x sit to stand in less than 14 sec without UE support, to reflect an improvement in her functional strength.     Time  8    Period  Weeks    Status  On-going      PT LONG TERM GOAL #5   Title  Pt will complete the TUG in less than 14 sec without UE support, to reflect an improvement in her balance and mobility.     Time  8    Period  Weeks    Status  On-going      PT LONG TERM GOAL #6   Title  Pt will maintain SLS for atleast 10 sec on each LE without LOB, to demonstrate improved hip stability and decrease her risk of falling.     Time  8    Period  Weeks    Status  On-going            Plan - 02/19/17 1709    Clinical Impression Statement  Unable to progress exercises secondary to  multi region pain in ankles, calf, bil knees, posterior thighs and LBP.  She is able tolerate supine and limited sitting exercises but she is unable to tolerate standing for any length of time.  She reports improvements in her ability to rise and also ability to straighten her knee out better but her FOTO outcome score shows no improvement.  Reassess for further progress toward goals.  If no improvement, will discharge to independent HEP.      Rehab Potential  Fair    PT Frequency  2x / week    PT Duration  8 weeks    PT Treatment/Interventions  ADLs/Self Care Home Management;Cryotherapy;Traction;Moist Heat;Iontophoresis 4mg /ml Dexamethasone;Electrical Stimulation;Gait  training;Neuromuscular re-education;Stair training;Functional mobility training;Balance training;Therapeutic exercise;Therapeutic activities;Patient/family education;Manual techniques;Passive range of motion;Dry needling    PT Next Visit Plan  assess progress toward goals;  recheck ROM/strength;  Trunk strengthening/gentle ROM, LE strengthening progressions,  low level standing ex;  seated on 2 foam cushions for ex;   modalities to address pain        Patient will benefit from skilled therapeutic intervention in order to improve the following deficits and impairments:  Abnormal gait, Decreased balance, Decreased endurance, Decreased mobility, Difficulty walking, Hypomobility, Increased muscle spasms, Improper body mechanics, Obesity, Decreased range of motion, Decreased strength, Impaired flexibility, Postural dysfunction, Pain  Visit Diagnosis: Chronic bilateral low back pain, with sciatica presence unspecified  Muscle spasm of back  Muscle weakness (generalized)  Chronic pain of left knee     Problem List Patient Active Problem List   Diagnosis Date Noted  . 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 Leg edema 09/02/2012  . Varicose veins of lower extremities with other complications 09/02/2012  . Pericarditis 10/05/2010  . Chest pain 06/15/2010  . Edema 06/15/2010  . Thyroid disease 06/15/2010   Lavinia SharpsStacy Tiffanee Mcnee, PT 02/19/17 5:13 PM Phone: [phone] Fax: [phone]  Vivien PrestoSimpson, Leontyne Manville C 02/19/2017, 5:13 PM  [facility] [address] [place], [place], [address] Phone: [phone]   Fax:  [phone]  Name: Keenan BachelorBrenda Fredenburg MRN: [id number] Date of Birth: 03-17-1955

## 2017-02-19 NOTE — Patient Instructions (Signed)
    Info on wedges found at [employer]

## 2017-02-26 ENCOUNTER — Encounter: Payer: Self-pay | Admitting: Physical Therapy

## 2017-02-26 ENCOUNTER — Ambulatory Visit: Payer: BLUE CROSS/BLUE SHIELD | Admitting: Physical Therapy

## 2017-02-26 DIAGNOSIS — M62830 Muscle spasm of back: Secondary | ICD-10-CM

## 2017-02-26 DIAGNOSIS — M545 Low back pain: Secondary | ICD-10-CM | POA: Diagnosis not present

## 2017-02-26 DIAGNOSIS — M25562 Pain in left knee: Secondary | ICD-10-CM

## 2017-02-26 DIAGNOSIS — M6281 Muscle weakness (generalized): Secondary | ICD-10-CM

## 2017-02-26 DIAGNOSIS — G8929 Other chronic pain: Secondary | ICD-10-CM

## 2017-02-26 NOTE — Therapy (Signed)
[facility] [address] [place], [place], [address] Phone: [phone]   Fax:  [phone]  Physical Therapy Treatment/Recertification   Patient Details  Name: Cheryl [name] MRN: [id number] Date of Birth: 11-10-1955 Referring Provider: Benedetto Goad, MD    Encounter Date: 02/26/2017  PT End of Session - 02/26/17 1720    Visit Number  11    Date for PT Re-Evaluation  04/09/17    Authorization Type  BCBS    PT Start Time  1515    PT Stop Time  1605    PT Time Calculation (min)  50 min    Activity Tolerance  Patient tolerated treatment well       Past Medical History:  Diagnosis Date  . Anemia   . Chest pain   . Chronic dermatitis   . Edema of both legs   . Grave's disease   . HLD (hyperlipidemia)   . HTN (hypertension)   . Obesity   . Varicose veins     Past Surgical History:  Procedure Laterality Date  . CHOLECYSTECTOMY    . ENDOVENOUS ABLATION SAPHENOUS VEIN W/ LASER    . SKIN GRAFT      There were no vitals filed for this visit.  Subjective Assessment - 02/26/17 1517    Subjective  Last night I was able to lie on my stomach for the first time in a long time.  I can get my leg straighter now than I was.  I can get up off the couch better now and the chair at work too.   These are good things we're doing in therapy.      Pertinent History  chest pain, HTN  MD 12/26    How long can you stand comfortably?  10 min    How long can you walk comfortably?  248ft    Currently in Pain?  Yes    Pain Score  5     Pain Location  Back    Pain Orientation  Lower    Aggravating Factors   walking, standing    Pain Score  3    Pain Location  Leg    Pain Orientation  Right;Left    Pain Type  Chronic pain    Aggravating Factors   getting up from the chair         [facility] PT Assessment - 02/26/17 0001      Observation/Other Assessments   Focus on Therapeutic Outcomes (FOTO)   64% limitation       AROM   AROM  Assessment Site  Knee    Right/Left Knee  Right;Left    Right Knee Extension  10    Right Knee Flexion  95    Left Knee Extension  20    Left Knee Flexion  105    Lumbar Flexion  WFLs    Lumbar Extension  10       Strength   Strength Assessment Site  -- Left SLR 7x; right SLR 7x    Right Hip Flexion  5/5    Right Hip Extension  4/5    Right Hip ABduction  4+/5    Left Hip Flexion  5/5    Left Hip Extension  4/5    Left Hip ABduction  4+/5    Right Knee Flexion  4/5    Right Knee Extension  5/5    Left Knee Flexion  4/5    Left Knee Extension  5/5  6 Minute Walk- Baseline   6 Minute Walk- Baseline  yes    BP (mmHg)  -- 560 feet 2 seated rest breaks      Standardized Balance Assessment   Five times sit to stand comments   18.8      Timed Up and Go Test   TUG Comments  14.5 sec with use of UEs                  [facility] Adult PT Treatment/Exercise - 02/26/17 0001      Knee/Hip Exercises: Aerobic   Nustep  L1 10 min       Knee/Hip Exercises: Supine   Straight Leg Raises  AROM;Left;Both 7x    Other Supine Knee/Hip Exercises  green ball for hip/knee flexion 10x    Other Supine Knee/Hip Exercises  lumbar rotation with green ball 10x each side               PT Short Term Goals - 02/26/17 1727      PT SHORT TERM GOAL #1   Title  Pt will demo consistency and independence with her HEP to improve LE strength and decrease pain.     Status  Achieved      PT SHORT TERM GOAL #2   Title  Pt will report atleast 25% improvement in her low back pain/activity tolerance from the start of therapy.     Time  4    Period  Weeks    Status  On-going      PT SHORT TERM GOAL #3   Title  Pt will demo improved mechanics with transitions sit to/from supine with proper log roll technique without assistance from the therapist, which will decrease strain on her low back.     Status  Achieved      PT SHORT TERM GOAL #4   Title  Pt will demo improved hip extension ROM to  atleast neutral during flexibility testing, which will increase her efficiency with ambulation.     Status  Achieved        PT Long Term Goals - 02/26/17 1544      PT LONG TERM GOAL #1   Title  Pt will be independent with more advanced HEP to prepare for discharge from PT.     Status  On-going      PT LONG TERM GOAL #2   Title  Pt will score atleast 50% or less impaired on FOTO to demo increased functional mobility.     Status  On-going      PT LONG TERM GOAL #3   Title  Pt will report improved standing tolerance at work to atleast 30 min without increase in her low back pain.     Baseline  see new goals    Time  4    Status  Deferred      PT LONG TERM GOAL #4   Title  Pt will complete 5x sit to stand in less than 15 sec without UE support, to reflect an improvement in her functional strength.     Status  Revised      PT LONG TERM GOAL #5   Title  Pt will complete the TUG in less than 14 sec without UE support, to reflect an improvement in her balance and mobility.     Status  On-going      PT LONG TERM GOAL #6   Title  Patient will be able to stand 12-15 minutes to wash  a sink full of dishes    Status  Revised      PT LONG TERM GOAL #7   Title  Six minute walk test 650 feet indicating improved gait speed needed for community ambulation    Time  8    Period  Weeks    Status  New            Plan - 02/26/17 1721    Clinical Impression Statement  The patient continues to have moderate back and bilateral leg pain that can become severe with standing.  Although she continues to be in a lot of pain, she reports PT has helped her to function much better with improvements with sit to stand from her couch and the chair at work.  She reports she is sleeping much better since she is turning over in the bed with greater ease and is able to straighten her knees in bed.  She feels she will decline functionally without PT treatment.  She would like to continue for 6 more weeks to  increase her walking distance and standing tolerance needed for work.  Her Six minute walk test is currently 560 feet with 2 sitting rest breaks needed.  Her Timed up and Go and 5x sit to stand tests were much improved.      Rehab Potential  Fair    PT Frequency  2x / week    PT Duration  6 weeks    PT Treatment/Interventions  ADLs/Self Care Home Management;Cryotherapy;Traction;Moist Heat;Iontophoresis 4mg /ml Dexamethasone;Electrical Stimulation;Gait training;Neuromuscular re-education;Stair training;Functional mobility training;Balance training;Therapeutic exercise;Therapeutic activities;Patient/family education;Manual techniques;Passive range of motion;Dry needling    PT Next Visit Plan  Nu-Step add resist;  continue with progression of standing;  LE strengthening:  ?leg press       Patient will benefit from skilled therapeutic intervention in order to improve the following deficits and impairments:  Abnormal gait, Decreased balance, Decreased endurance, Decreased mobility, Difficulty walking, Hypomobility, Increased muscle spasms, Improper body mechanics, Obesity, Decreased range of motion, Decreased strength, Impaired flexibility, Postural dysfunction, Pain  Visit Diagnosis: Chronic bilateral low back pain, with sciatica presence unspecified - Plan: PT plan of care cert/re-cert  Muscle spasm of back - Plan: PT plan of care cert/re-cert  Muscle weakness (generalized) - Plan: PT plan of care cert/re-cert  Chronic pain of left knee - Plan: PT plan of care cert/re-cert     Problem List Patient Active Problem List   Diagnosis Date Noted  . 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 Leg edema 09/02/2012  . Varicose veins of lower extremities with other complications 09/02/2012  . Pericarditis 10/05/2010  . Chest pain 06/15/2010  . Edema 06/15/2010  . Thyroid disease 06/15/2010   Lavinia SharpsStacy Ramie Palladino, PT 02/26/17 5:31 PM Phone: [phone] Fax: [phone]  Vivien PrestoSimpson, Martiza Speth  C 02/26/2017, 5:31 PM  [facility] [address] [place], [place], [address] Phone: [phone]   Fax:  [phone]  Name: Cheryl [name] MRN: [id number] Date of Birth: 1955-09-10

## 2017-03-07 ENCOUNTER — Ambulatory Visit: Payer: BLUE CROSS/BLUE SHIELD | Admitting: Physical Therapy

## 2017-03-07 ENCOUNTER — Encounter: Payer: Self-pay | Admitting: Physical Therapy

## 2017-03-07 DIAGNOSIS — M545 Low back pain: Principal | ICD-10-CM

## 2017-03-07 DIAGNOSIS — M62830 Muscle spasm of back: Secondary | ICD-10-CM

## 2017-03-07 DIAGNOSIS — M25562 Pain in left knee: Secondary | ICD-10-CM

## 2017-03-07 DIAGNOSIS — G8929 Other chronic pain: Secondary | ICD-10-CM

## 2017-03-07 DIAGNOSIS — M6281 Muscle weakness (generalized): Secondary | ICD-10-CM

## 2017-03-07 NOTE — Therapy (Signed)
[facility] [address], [address] [place], [place], [address] Phone: [phone]   Fax:  [phone]  Physical Therapy Treatment  Patient Details  Name: Cheryl [name] MRN: [id number] Date of Birth: 07-16-1955 Referring Provider: Benedetto Goad, MD    Encounter Date: 03/07/2017  PT End of Session - 03/07/17 1543    Visit Number  12    Number of Visits  16    Date for PT Re-Evaluation  04/09/17    Authorization Type  BCBS    Authorization Time Period  01/02/17 to 02/27/17    PT Start Time  1520    PT Stop Time  1600    PT Time Calculation (min)  40 min    Activity Tolerance  Patient tolerated treatment well       Past Medical History:  Diagnosis Date  . Anemia   . Chest pain   . Chronic dermatitis   . Edema of both legs   . Grave's disease   . HLD (hyperlipidemia)   . HTN (hypertension)   . Obesity   . Varicose veins     Past Surgical History:  Procedure Laterality Date  . CHOLECYSTECTOMY    . ENDOVENOUS ABLATION SAPHENOUS VEIN W/ LASER    . SKIN GRAFT      There were no vitals filed for this visit.  Subjective Assessment - 03/07/17 1529    Subjective  Tired from work.  Some days rough, others not.  I can get up and down off the toilet easier.  I can also get my legs straighter in the bed.      Currently in Pain?  Yes    Pain Score  5     Pain Location  Leg left > right    Pain Orientation  Right;Left    Aggravating Factors   walking, standing                       [facility] Adult PT Treatment/Exercise - 03/07/17 0001      Lumbar Exercises: Machines for Strengthening   Leg Press  seat 9 50# bil 30x      Lumbar Exercises: Seated   Other Seated Lumbar Exercises  foam roll push down to activate lower abdominal muscles 10x    Other Seated Lumbar Exercises  seated ankle rocker board       Lumbar Exercises: Supine   Clam  10 reps;20 reps green band double and single    Bridge  5 reps with green  ball    Other Supine Lumbar Exercises  marching 10x from green ball    Other Supine Lumbar Exercises  whole leg push down green band with therapist holding 10x right/left      Knee/Hip Exercises: Aerobic   Nustep  L1 10 min       Knee/Hip Exercises: Supine   Other Supine Knee/Hip Exercises  green ball for hip/knee flexion 10x    Other Supine Knee/Hip Exercises  lumbar rotation with green ball 10x each side               PT Short Term Goals - 02/26/17 1727      PT SHORT TERM GOAL #1   Title  Pt will demo consistency and independence with her HEP to improve LE strength and decrease pain.     Status  Achieved      PT SHORT TERM GOAL #2   Title  Pt will report atleast 25%  improvement in her low back pain/activity tolerance from the start of therapy.     Time  4    Period  Weeks    Status  On-going      PT SHORT TERM GOAL #3   Title  Pt will demo improved mechanics with transitions sit to/from supine with proper log roll technique without assistance from the therapist, which will decrease strain on her low back.     Status  Achieved      PT SHORT TERM GOAL #4   Title  Pt will demo improved hip extension ROM to atleast neutral during flexibility testing, which will increase her efficiency with ambulation.     Status  Achieved        PT Long Term Goals - 02/26/17 1544      PT LONG TERM GOAL #1   Title  Pt will be independent with more advanced HEP to prepare for discharge from PT.     Status  On-going      PT LONG TERM GOAL #2   Title  Pt will score atleast 50% or less impaired on FOTO to demo increased functional mobility.     Status  On-going      PT LONG TERM GOAL #3   Title  Pt will report improved standing tolerance at work to atleast 30 min without increase in her low back pain.     Baseline  see new goals    Time  4    Status  Deferred      PT LONG TERM GOAL #4   Title  Pt will complete 5x sit to stand in less than 15 sec without UE support, to reflect an  improvement in her functional strength.     Status  Revised      PT LONG TERM GOAL #5   Title  Pt will complete the TUG in less than 14 sec without UE support, to reflect an improvement in her balance and mobility.     Status  On-going      PT LONG TERM GOAL #6   Title  Patient will be able to stand 12-15 minutes to wash a sink full of dishes    Status  Revised      PT LONG TERM GOAL #7   Title  Six minute walk test 650 feet indicating improved gait speed needed for community ambulation    Time  8    Period  Weeks    Status  New            Plan - 03/07/17 1547    Clinical Impression Statement  The patient's main complaint today is hamstring region and calf pain bilaterally.  She is still painful in with many exercises but she reports signficant functional gains.  She is able to participate in a progression of strengthening exercise today without an exacerbation of symptoms.      Rehab Potential  Fair    PT Frequency  2x / week    PT Duration  6 weeks    PT Treatment/Interventions  ADLs/Self Care Home Management;Cryotherapy;Traction;Moist Heat;Iontophoresis 4mg /ml Dexamethasone;Electrical Stimulation;Gait training;Neuromuscular re-education;Stair training;Functional mobility training;Balance training;Therapeutic exercise;Therapeutic activities;Patient/family education;Manual techniques;Passive range of motion;Dry needling    PT Next Visit Plan  Nu-Step add resist;  continue with progression of standing;  LE strengthening:  leg press and add single leg press       Patient will benefit from skilled therapeutic intervention in order to improve the following deficits and impairments:  Abnormal gait, Decreased balance, Decreased endurance, Decreased mobility, Difficulty walking, Hypomobility, Increased muscle spasms, Improper body mechanics, Obesity, Decreased range of motion, Decreased strength, Impaired flexibility, Postural dysfunction, Pain  Visit Diagnosis: Chronic bilateral low  back pain, with sciatica presence unspecified  Muscle spasm of back  Muscle weakness (generalized)  Chronic pain of left knee     Problem List Patient Active Problem List   Diagnosis Date Noted  . 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 Leg edema 09/02/2012  . Varicose veins of lower extremities with other complications 09/02/2012  . Pericarditis 10/05/2010  . Chest pain 06/15/2010  . Edema 06/15/2010  . Thyroid disease 06/15/2010   Lavinia SharpsStacy Simpson, PT 03/07/17 4:02 PM Phone: [phone] Fax: [phone]  Cheryl [name], Cheryl [name] 03/07/2017, 4:01 PM  [facility] [address], [address] [place], [place], [address] Phone: [phone]   Fax:  [phone]  Name: [name] MRN: [id number] Date of Birth: Jul 12, 1955

## 2017-03-12 ENCOUNTER — Ambulatory Visit: Payer: BLUE CROSS/BLUE SHIELD | Attending: Family Medicine | Admitting: Physical Therapy

## 2017-03-12 ENCOUNTER — Encounter: Payer: Self-pay | Admitting: Physical Therapy

## 2017-03-12 DIAGNOSIS — M25562 Pain in left knee: Secondary | ICD-10-CM | POA: Diagnosis present

## 2017-03-12 DIAGNOSIS — M62830 Muscle spasm of back: Secondary | ICD-10-CM | POA: Diagnosis present

## 2017-03-12 DIAGNOSIS — M6281 Muscle weakness (generalized): Secondary | ICD-10-CM | POA: Diagnosis present

## 2017-03-12 DIAGNOSIS — M545 Low back pain: Secondary | ICD-10-CM | POA: Diagnosis not present

## 2017-03-12 DIAGNOSIS — G8929 Other chronic pain: Secondary | ICD-10-CM | POA: Insufficient documentation

## 2017-03-12 NOTE — Therapy (Signed)
[facility] [address], [address] [place], [place], [address] Phone: [phone]   Fax:  [phone]  Physical Therapy Treatment  Patient Details  Name: Cheryl [name] MRN: [id number] Date of Birth: 27-Aug-1955 Referring Provider: Benedetto GoadFred Wilson, MD    Encounter Date: 03/12/2017  PT End of Session - 03/12/17 1555    Visit Number  13    Date for PT Re-Evaluation  04/09/17    Authorization Type  BCBS    PT Start Time  1520    PT Stop Time  1605    PT Time Calculation (min)  45 min    Activity Tolerance  Patient tolerated treatment well       Past Medical History:  Diagnosis Date  . Anemia   . Chest pain   . Chronic dermatitis   . Edema of both legs   . Grave's disease   . HLD (hyperlipidemia)   . HTN (hypertension)   . Obesity   . Varicose veins     Past Surgical History:  Procedure Laterality Date  . CHOLECYSTECTOMY    . ENDOVENOUS ABLATION SAPHENOUS VEIN W/ LASER    . SKIN GRAFT      There were no vitals filed for this visit.  Subjective Assessment - 03/12/17 1541    Subjective  C/o foot pain, pain in calf muscles and HS region today.  I stayed in bed about all weekend.  Felt better after that.      Currently in Pain?  Yes    Pain Score  8     Pain Location  Leg    Pain Orientation  Right;Left                      [facility] Adult PT Treatment/Exercise - 03/12/17 0001      Lumbar Exercises: Machines for Strengthening   Leg Press  seat 9 40# bil 25X      Lumbar Exercises: Seated   Other Seated Lumbar Exercises  seated ankle rocker board       Lumbar Exercises: Supine   Bridge  5 reps with green ball    Other Supine Lumbar Exercises  marching 10x from green ball    Other Supine Lumbar Exercises  HS sets on green ball 10x      Knee/Hip Exercises: Aerobic   Nustep  L1 10 min       Knee/Hip Exercises: Seated   Long Arc Quad  Strengthening;Right;Left;10 reps;Weights    Long Arc Quad Weight  2  lbs.    Ball Squeeze  10x    Clamshell with TheraBand  Blue single and double 30x    Marching  Strengthening;Right;Left;10 reps;Weights    Marching Limitations  sitting on black foam wedge    Marching Weights  2 lbs.      Knee/Hip Exercises: Supine   Other Supine Knee/Hip Exercises  green ball for hip/knee flexion 10x    Other Supine Knee/Hip Exercises  lumbar rotation with green ball 10x each side               PT Short Term Goals - 02/26/17 1727      PT SHORT TERM GOAL #1   Title  Pt will demo consistency and independence with her HEP to improve LE strength and decrease pain.     Status  Achieved      PT SHORT TERM GOAL #2   Title  Pt will report atleast 25% improvement in her  low back pain/activity tolerance from the start of therapy.     Time  4    Period  Weeks    Status  On-going      PT SHORT TERM GOAL #3   Title  Pt will demo improved mechanics with transitions sit to/from supine with proper log roll technique without assistance from the therapist, which will decrease strain on her low back.     Status  Achieved      PT SHORT TERM GOAL #4   Title  Pt will demo improved hip extension ROM to atleast neutral during flexibility testing, which will increase her efficiency with ambulation.     Status  Achieved        PT Long Term Goals - 02/26/17 1544      PT LONG TERM GOAL #1   Title  Pt will be independent with more advanced HEP to prepare for discharge from PT.     Status  On-going      PT LONG TERM GOAL #2   Title  Pt will score atleast 50% or less impaired on FOTO to demo increased functional mobility.     Status  On-going      PT LONG TERM GOAL #3   Title  Pt will report improved standing tolerance at work to atleast 30 min without increase in her low back pain.     Baseline  see new goals    Time  4    Status  Deferred      PT LONG TERM GOAL #4   Title  Pt will complete 5x sit to stand in less than 15 sec without UE support, to reflect an  improvement in her functional strength.     Status  Revised      PT LONG TERM GOAL #5   Title  Pt will complete the TUG in less than 14 sec without UE support, to reflect an improvement in her balance and mobility.     Status  On-going      PT LONG TERM GOAL #6   Title  Patient will be able to stand 12-15 minutes to wash a sink full of dishes    Status  Revised      PT LONG TERM GOAL #7   Title  Six minute walk test 650 feet indicating improved gait speed needed for community ambulation    Time  8    Period  Weeks    Status  New            Plan - 03/12/17 1555    Clinical Impression Statement  The patient continues to complain of "severe" LE region pain bilaterally.  She is able to continue with a progression of exercises although at a decreased intensity (light resistance on leg press).  Slower progression is necessary secondary to multi region pain, chronicity and pain intensity.      Rehab Potential  Fair    PT Frequency  2x / week    PT Duration  6 weeks    PT Treatment/Interventions  ADLs/Self Care Home Management;Cryotherapy;Traction;Moist Heat;Iontophoresis 4mg /ml Dexamethasone;Electrical Stimulation;Gait training;Neuromuscular re-education;Stair training;Functional mobility training;Balance training;Therapeutic exercise;Therapeutic activities;Patient/family education;Manual techniques;Passive range of motion;Dry needling    PT Next Visit Plan  Nu-Step add resist;  continue with progression of standing;  LE strengthening:  leg press and add single leg press       Patient will benefit from skilled therapeutic intervention in order to improve the following deficits and impairments:  Abnormal  gait, Decreased balance, Decreased endurance, Decreased mobility, Difficulty walking, Hypomobility, Increased muscle spasms, Improper body mechanics, Obesity, Decreased range of motion, Decreased strength, Impaired flexibility, Postural dysfunction, Pain  Visit Diagnosis: Chronic  bilateral low back pain, with sciatica presence unspecified  Muscle spasm of back  Muscle weakness (generalized)  Chronic pain of left knee     Problem List Patient Active Problem List   Diagnosis Date Noted  . 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 Leg edema 09/02/2012  . Varicose veins of lower extremities with other complications 09/02/2012  . Pericarditis 10/05/2010  . Chest pain 06/15/2010  . Edema 06/15/2010  . Thyroid disease 06/15/2010   Lavinia Sharps, PT 03/12/17 4:02 PM Phone: [phone] Fax: [phone]  Vivien Presto 03/12/2017, 4:02 PM  [facility] [address] [place], [place], [address] Phone: [phone]   Fax:  [phone]  Name: Cheryl [name] MRN: [id number] Date of Birth: 09/08/1955

## 2017-03-14 ENCOUNTER — Encounter: Payer: Self-pay | Admitting: Physical Therapy

## 2017-03-14 ENCOUNTER — Ambulatory Visit: Payer: BLUE CROSS/BLUE SHIELD | Admitting: Physical Therapy

## 2017-03-14 DIAGNOSIS — M62830 Muscle spasm of back: Secondary | ICD-10-CM

## 2017-03-14 DIAGNOSIS — G8929 Other chronic pain: Secondary | ICD-10-CM

## 2017-03-14 DIAGNOSIS — M545 Low back pain: Principal | ICD-10-CM

## 2017-03-14 DIAGNOSIS — M6281 Muscle weakness (generalized): Secondary | ICD-10-CM

## 2017-03-14 DIAGNOSIS — M25562 Pain in left knee: Secondary | ICD-10-CM

## 2017-03-14 NOTE — Therapy (Signed)
[facility] [address] [place], [place], [address] Phone: [phone]   Fax:  [phone]  Physical Therapy Treatment  Patient Details  Name: Cheryl [name] MRN: [id number] Date of Birth: September 19, 1955 Referring Provider: Benedetto Goad, MD    Encounter Date: 03/14/2017  PT End of Session - 03/14/17 1555    Visit Number  14    Date for PT Re-Evaluation  04/09/17    Authorization Type  BCBS    PT Start Time  1520    PT Stop Time  1600    PT Time Calculation (min)  40 min    Activity Tolerance  Patient tolerated treatment well       Past Medical History:  Diagnosis Date  . Anemia   . Chest pain   . Chronic dermatitis   . Edema of both legs   . Grave's disease   . HLD (hyperlipidemia)   . HTN (hypertension)   . Obesity   . Varicose veins     Past Surgical History:  Procedure Laterality Date  . CHOLECYSTECTOMY    . ENDOVENOUS ABLATION SAPHENOUS VEIN W/ LASER    . SKIN GRAFT      There were no vitals filed for this visit.  Subjective Assessment - 03/14/17 1525    Subjective  Yesterday I wasn't feeling too good but it got better.  I had to do a lot of walking.  I'm trying hard to keep my toes from curling up.      Currently in Pain?  Yes    Pain Score  5     Pain Location  Leg shins and left foot    Pain Orientation  Right;Left    Pain Type  Chronic pain    Aggravating Factors   walking, standing                      [facility] Adult PT Treatment/Exercise - 03/14/17 0001      Lumbar Exercises: Machines for Strengthening   Leg Press  seat 8 45# bil 25X      Lumbar Exercises: Seated   Other Seated Lumbar Exercises  seated ankle rocker board       Knee/Hip Exercises: Aerobic   Nustep  L2 10 min      Knee/Hip Exercises: Seated   Long Arc Quad  Strengthening;Right;Left;10 reps;Weights    Long Arc Quad Weight  2 lbs.    Ball Squeeze  10x    Clamshell with TheraBand  Blue single and double 30x     Other Seated Knee/Hip Exercises  single leg press with green band 15x     Other Seated Knee/Hip Exercises  hip extension push into green band 10x right/left therapist holding     Marching  Strengthening;Right;Left;10 reps;Weights    Marching Limitations  sitting on black foam wedge    Marching Weights  2 lbs.    Hamstring Curl  Strengthening;Right;Left;15 reps    Hamstring Limitations  red band    Sit to Sand  10 reps;without UE support 1 foam pad                PT Short Term Goals - 03/14/17 1558      PT SHORT TERM GOAL #1   Title  Pt will demo consistency and independence with her HEP to improve LE strength and decrease pain.     Status  Achieved      PT SHORT TERM GOAL #2  Title  Pt will report atleast 25% improvement in her low back pain/activity tolerance from the start of therapy.     Status  Achieved      PT SHORT TERM GOAL #3   Title  Pt will demo improved mechanics with transitions sit to/from supine with proper log roll technique without assistance from the therapist, which will decrease strain on her low back.     Status  Achieved        PT Long Term Goals - 03/14/17 1558      PT LONG TERM GOAL #1   Title  Pt will be independent with more advanced HEP to prepare for discharge from PT.     Target Date  04/09/17      PT LONG TERM GOAL #2   Title  Pt will score atleast 50% or less impaired on FOTO to demo increased functional mobility.     Time  8    Period  Weeks    Status  On-going      PT LONG TERM GOAL #3   Title  Pt will report improved standing tolerance at work to atleast 30 min without increase in her low back pain.     Status  Deferred      PT LONG TERM GOAL #4   Title  Pt will complete 5x sit to stand in less than 15 sec without UE support, to reflect an improvement in her functional strength.     Time  8    Period  Weeks    Status  Revised      PT LONG TERM GOAL #5   Title  Pt will complete the TUG in less than 14 sec without UE  support, to reflect an improvement in her balance and mobility.     Time  8    Period  Weeks    Status  On-going      PT LONG TERM GOAL #6   Title  Patient will be able to stand 12-15 minutes to wash a sink full of dishes    Time  8    Period  Weeks    Status  On-going      PT LONG TERM GOAL #7   Title  Six minute walk test 650 feet indicating improved gait speed needed for community ambulation    Time  8    Period  Weeks    Status  On-going            Plan - 03/14/17 1556    Clinical Impression Statement  The patient reports today is a better day.  She is able to tolerate a progression of resistive exercise needed for strengthening without an exacerbation of pain.  She does report muscular fatigue following treatment session.      Rehab Potential  Fair    PT Frequency  2x / week    PT Treatment/Interventions  ADLs/Self Care Home Management;Cryotherapy;Traction;Moist Heat;Iontophoresis 4mg /ml Dexamethasone;Electrical Stimulation;Gait training;Neuromuscular re-education;Stair training;Functional mobility training;Balance training;Therapeutic exercise;Therapeutic activities;Patient/family education;Manual techniques;Passive range of motion;Dry needling    PT Next Visit Plan  Nu-Step add resist;  continue with progression of resistance;  LE strengthening:  leg press and add single leg press       Patient will benefit from skilled therapeutic intervention in order to improve the following deficits and impairments:  Abnormal gait, Decreased balance, Decreased endurance, Decreased mobility, Difficulty walking, Hypomobility, Increased muscle spasms, Improper body mechanics, Obesity, Decreased range of motion, Decreased strength, Impaired flexibility, Postural  dysfunction, Pain  Visit Diagnosis: Chronic bilateral low back pain, with sciatica presence unspecified  Muscle spasm of back  Muscle weakness (generalized)  Chronic pain of left knee     Problem List Patient Active  Problem List   Diagnosis Date Noted  . 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 Leg edema 09/02/2012  . Varicose veins of lower extremities with other complications 09/02/2012  . Pericarditis 10/05/2010  . Chest pain 06/15/2010  . Edema 06/15/2010  . Thyroid disease 06/15/2010   Lavinia Sharps, PT 03/14/17 4:02 PM Phone: [phone] Fax: [phone]  Vivien Presto 03/14/2017, 4:01 PM   [facility] [address] [place], [place], [address] Phone: [phone]   Fax:  [phone]  Name: Cheryl [name] MRN: [id number] Date of Birth: Oct 01, 1955

## 2017-03-19 ENCOUNTER — Encounter: Payer: Self-pay | Admitting: Physical Therapy

## 2017-03-19 ENCOUNTER — Ambulatory Visit: Payer: BLUE CROSS/BLUE SHIELD | Admitting: Physical Therapy

## 2017-03-19 DIAGNOSIS — M6281 Muscle weakness (generalized): Secondary | ICD-10-CM

## 2017-03-19 DIAGNOSIS — M62830 Muscle spasm of back: Secondary | ICD-10-CM

## 2017-03-19 DIAGNOSIS — M25562 Pain in left knee: Secondary | ICD-10-CM

## 2017-03-19 DIAGNOSIS — M545 Low back pain: Secondary | ICD-10-CM | POA: Diagnosis not present

## 2017-03-19 DIAGNOSIS — G8929 Other chronic pain: Secondary | ICD-10-CM

## 2017-03-19 NOTE — Therapy (Signed)
[facility] [address] [place], [place], [address] Phone: [phone]   Fax:  [phone]  Physical Therapy Treatment  Patient Details  Name: Cheryl [name] MRN: [id number] Date of Birth: [birth year] Referring Provider: Benedetto Goad, MD    Encounter Date: 03/19/2017  PT End of Session - 03/19/17 1555    Visit Number  15    Date for PT Re-Evaluation  04/09/17    Authorization Type  BCBS    PT Start Time  1520    PT Stop Time  1601    PT Time Calculation (min)  41 min    Activity Tolerance  Patient tolerated treatment well       Past Medical History:  Diagnosis Date  . Anemia   . Chest pain   . Chronic dermatitis   . Edema of both legs   . Grave's disease   . HLD (hyperlipidemia)   . HTN (hypertension)   . Obesity   . Varicose veins     Past Surgical History:  Procedure Laterality Date  . CHOLECYSTECTOMY    . ENDOVENOUS ABLATION SAPHENOUS VEIN W/ LASER    . SKIN GRAFT      There were no vitals filed for this visit.  Subjective Assessment - 03/19/17 1531    Subjective  Last time was just as sore as the previous time but I need it.  "Today (at work) about killed my back."  "I had to tell them to help me at work." It's getting better though.  I can get my legs straigher in the bed and it doesn't hurt nearly bad as it did.      Currently in Pain?  Yes    Pain Score  6     Pain Location  Back    Aggravating Factors   behind knees 8/10 standing     Pain Type  Chronic pain    Aggravating Factors   standing up          [facility] PT Assessment - 03/19/17 0001      Strength   Right Knee Flexion  4/5    Right Knee Extension  5/5    Left Knee Flexion  4/5    Left Knee Extension  5/5                  [facility] Adult PT Treatment/Exercise - 03/19/17 0001      Lumbar Exercises: Machines for Strengthening   Leg Press  seat 8 45# bil 15X' single leg 25# 10x each leg       Lumbar Exercises: Supine   Bridge  5 reps with green ball    Other Supine Lumbar Exercises  green ball rolls 10x; lumbar rotation on ball     Other Supine Lumbar Exercises  HS sets on green ball 10x      Knee/Hip Exercises: Aerobic   Nustep  L2 10 min      Knee/Hip Exercises: Seated   Long Arc Quad  Strengthening;Right;Left;2 sets;10 reps;Weights    Long Arc Quad Weight  2 lbs.    Clamshell with TheraBand  Green 20x    Other Seated Knee/Hip Exercises  single leg press with green band 15x     Other Seated Knee/Hip Exercises  hip extension push into green band 10x right/left therapist holding     Marching Weights  2 lbs. 10x bil     Sit to Sand  -- from high table 12 no hand  PT Short Term Goals - 03/14/17 1558      PT SHORT TERM GOAL #1   Title  Pt will demo consistency and independence with her HEP to improve LE strength and decrease pain.     Status  Achieved      PT SHORT TERM GOAL #2   Title  Pt will report atleast 25% improvement in her low back pain/activity tolerance from the start of therapy.     Status  Achieved      PT SHORT TERM GOAL #3   Title  Pt will demo improved mechanics with transitions sit to/from supine with proper log roll technique without assistance from the therapist, which will decrease strain on her low back.     Status  Achieved        PT Long Term Goals - 03/14/17 1558      PT LONG TERM GOAL #1   Title  Pt will be independent with more advanced HEP to prepare for discharge from PT.     Target Date  04/09/17      PT LONG TERM GOAL #2   Title  Pt will score atleast 50% or less impaired on FOTO to demo increased functional mobility.     Time  8    Period  Weeks    Status  On-going      PT LONG TERM GOAL #3   Title  Pt will report improved standing tolerance at work to atleast 30 min without increase in her low back pain.     Status  Deferred      PT LONG TERM GOAL #4   Title  Pt will complete 5x sit to stand in less than 15 sec without UE support,  to reflect an improvement in her functional strength.     Time  8    Period  Weeks    Status  Revised      PT LONG TERM GOAL #5   Title  Pt will complete the TUG in less than 14 sec without UE support, to reflect an improvement in her balance and mobility.     Time  8    Period  Weeks    Status  On-going      PT LONG TERM GOAL #6   Title  Patient will be able to stand 12-15 minutes to wash a sink full of dishes    Time  8    Period  Weeks    Status  On-going      PT LONG TERM GOAL #7   Title  Six minute walk test 650 feet indicating improved gait speed needed for community ambulation    Time  8    Period  Weeks    Status  On-going            Plan - 03/19/17 1556    Clinical Impression Statement  The patient continues to be in moderate pain on a daily basis associated with a lot of standing at work.  She is able to participate in a progression of strengthening and progressive resisted ex in an open chain method with minimal increase in pain.  Closed chain activities still very painful.   Therapist closely monitoring response with all and modifying as needed.      Rehab Potential  Fair    PT Frequency  2x / week    PT Duration  6 weeks    PT Treatment/Interventions  ADLs/Self Care Home Management;Cryotherapy;Traction;Moist Heat;Iontophoresis 4mg /ml Dexamethasone;Electrical Stimulation;Gait  training;Neuromuscular re-education;Stair training;Functional mobility training;Balance training;Therapeutic exercise;Therapeutic activities;Patient/family education;Manual techniques;Passive range of motion;Dry needling    PT Next Visit Plan  Nu-Step add resist;  continue with progression of resistance;  LE strengthening:  leg press and add single leg press;  recheck ROM next visit.         Patient will benefit from skilled therapeutic intervention in order to improve the following deficits and impairments:  Abnormal gait, Decreased balance, Decreased endurance, Decreased mobility, Difficulty  walking, Hypomobility, Increased muscle spasms, Improper body mechanics, Obesity, Decreased range of motion, Decreased strength, Impaired flexibility, Postural dysfunction, Pain  Visit Diagnosis: Chronic bilateral low back pain, with sciatica presence unspecified  Muscle spasm of back  Muscle weakness (generalized)  Chronic pain of left knee     Problem List Patient Active Problem List   Diagnosis Date Noted  . 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 Leg edema 09/02/2012  . Varicose veins of lower extremities with other complications 09/02/2012  . Pericarditis 10/05/2010  . Chest pain 06/15/2010  . Edema 06/15/2010  . Thyroid disease 06/15/2010   Lavinia SharpsStacy Simpson, PT 03/19/17 4:07 PM Phone: [phone] Fax: [phone]  Vivien PrestoSimpson, Stacy C 03/19/2017, 4:06 PM  [facility] [address] [place], [place], [address] Phone: [phone]   Fax:  [phone]  Name: Keenan BachelorBrenda Hantz MRN: [id number] Date of Birth: 1955/08/11

## 2017-03-21 ENCOUNTER — Encounter: Payer: Self-pay | Admitting: Physical Therapy

## 2017-03-21 ENCOUNTER — Ambulatory Visit: Payer: BLUE CROSS/BLUE SHIELD | Admitting: Physical Therapy

## 2017-03-21 DIAGNOSIS — M545 Low back pain: Principal | ICD-10-CM

## 2017-03-21 DIAGNOSIS — M6281 Muscle weakness (generalized): Secondary | ICD-10-CM

## 2017-03-21 DIAGNOSIS — M25562 Pain in left knee: Secondary | ICD-10-CM

## 2017-03-21 DIAGNOSIS — M62830 Muscle spasm of back: Secondary | ICD-10-CM

## 2017-03-21 DIAGNOSIS — G8929 Other chronic pain: Secondary | ICD-10-CM

## 2017-03-21 NOTE — Therapy (Signed)
[facility] [address], [address] [place], [place], [address] Phone: [phone]   Fax:  [phone]  Physical Therapy Treatment  Patient Details  Name: Cheryl [name] MRN: [id number] Date of Birth: 04/26/1955 Referring Provider: Benedetto Goad, MD    Encounter Date: 03/21/2017  PT End of Session - 03/21/17 1528    Visit Number  16    Date for PT Re-Evaluation  04/09/17    Authorization Type  BCBS    PT Start Time  1505    PT Stop Time  1545    PT Time Calculation (min)  40 min    Activity Tolerance  Patient tolerated treatment well       Past Medical History:  Diagnosis Date  . Anemia   . Chest pain   . Chronic dermatitis   . Edema of both legs   . Grave's disease   . HLD (hyperlipidemia)   . HTN (hypertension)   . Obesity   . Varicose veins     Past Surgical History:  Procedure Laterality Date  . CHOLECYSTECTOMY    . ENDOVENOUS ABLATION SAPHENOUS VEIN W/ LASER    . SKIN GRAFT      There were no vitals filed for this visit.  Subjective Assessment - 03/21/17 1509    Subjective  My back is hurting today.  I don't know if it's from work or what we did here.  The (therapy) hurts but I feel like I need to do it.  I was able to sleep for 2 hours on my stomach without getting cramps in my legs so that was good!    Currently in Pain?  Yes    Pain Score  7     Pain Location  Back    Pain Orientation  Lower;Right;Left    Pain Type  Chronic pain    Pain Radiating Towards  backs of my legs    Aggravating Factors   standing         [facility] PT Assessment - 03/21/17 0001      AROM   Right Knee Extension  15    Right Knee Flexion  100    Left Knee Extension  15    Left Knee Flexion  110    Lumbar Flexion  WFLs    Lumbar Extension  10                  [facility] Adult PT Treatment/Exercise - 03/21/17 0001      Lumbar Exercises: Stretches   Active Hamstring Stretch  Right;Left;5 reps seated reaching with 1  hand      Lumbar Exercises: Machines for Strengthening   Leg Press  seat 8 50# bil 15X' single leg 25# 10x each leg       Lumbar Exercises: Seated   Other Seated Lumbar Exercises  seated ankle rocker board       Knee/Hip Exercises: Aerobic   Nustep  L2 10 min      Knee/Hip Exercises: Seated   Long Arc Quad  Strengthening;Right;Left    Long Arc Quad Weight  3 lbs.    Clamshell with TheraBand  Green 20x    Other Seated Knee/Hip Exercises  single leg press with green band 15x     Other Seated Knee/Hip Exercises  hip extension push into green band 10x right/left therapist holding     Marching Weights  3 lbs.    Hamstring Curl  Strengthening;Right;Left;15 reps    Hamstring Limitations  green band    Sit to Sand  -- 1 foam pad                PT Short Term Goals - 03/14/17 1558      PT SHORT TERM GOAL #1   Title  Pt will demo consistency and independence with her HEP to improve LE strength and decrease pain.     Status  Achieved      PT SHORT TERM GOAL #2   Title  Pt will report atleast 25% improvement in her low back pain/activity tolerance from the start of therapy.     Status  Achieved      PT SHORT TERM GOAL #3   Title  Pt will demo improved mechanics with transitions sit to/from supine with proper log roll technique without assistance from the therapist, which will decrease strain on her low back.     Status  Achieved        PT Long Term Goals - 03/21/17 1645      PT LONG TERM GOAL #1   Title  Pt will be independent with more advanced HEP to prepare for discharge from PT.     Time  8    Period  Weeks    Status  On-going      PT LONG TERM GOAL #2   Title  Pt will score atleast 50% or less impaired on FOTO to demo increased functional mobility.     Time  8    Period  Weeks    Status  On-going      PT LONG TERM GOAL #3   Title  Pt will report improved standing tolerance at work to atleast 30 min without increase in her low back pain.     Status  Deferred       PT LONG TERM GOAL #4   Title  Pt will complete 5x sit to stand in less than 15 sec without UE support, to reflect an improvement in her functional strength.     Time  8    Period  Weeks    Status  Revised      PT LONG TERM GOAL #5   Title  Pt will complete the TUG in less than 14 sec without UE support, to reflect an improvement in her balance and mobility.     Time  8    Period  Weeks    Status  On-going      PT LONG TERM GOAL #6   Title  Patient will be able to stand 12-15 minutes to wash a sink full of dishes    Time  8    Period  Weeks    Status  On-going      PT LONG TERM GOAL #7   Title  Six minute walk test 650 feet indicating improved gait speed needed for community ambulation    Time  8    Period  Weeks    Status  On-going            Plan - 03/21/17 1641    Clinical Impression Statement  The patient arrives > 1 hour early for appt.  She reports increased back pain and her usual moderate to severe LE pain after working all day.  She is able to participate in increased resistance or repetition number despite pain level.  Improved bilateral knee ROM.      Rehab Potential  Fair    PT Frequency  2x / week  PT Duration  6 weeks    PT Treatment/Interventions  ADLs/Self Care Home Management;Cryotherapy;Traction;Moist Heat;Iontophoresis 4mg /ml Dexamethasone;Electrical Stimulation;Gait training;Neuromuscular re-education;Stair training;Functional mobility training;Balance training;Therapeutic exercise;Therapeutic activities;Patient/family education;Manual techniques;Passive range of motion;Dry needling    PT Next Visit Plan  Nu-Step; continue with progression of resistance;  LE strengthening:  leg press and add single leg press;  do 6 min walk test       Patient will benefit from skilled therapeutic intervention in order to improve the following deficits and impairments:  Abnormal gait, Decreased balance, Decreased endurance, Decreased mobility, Difficulty walking,  Hypomobility, Increased muscle spasms, Improper body mechanics, Obesity, Decreased range of motion, Decreased strength, Impaired flexibility, Postural dysfunction, Pain  Visit Diagnosis: Chronic bilateral low back pain, with sciatica presence unspecified  Muscle spasm of back  Muscle weakness (generalized)  Chronic pain of left knee     Problem List Patient Active Problem List   Diagnosis Date Noted  . 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 Leg edema 09/02/2012  . Varicose veins of lower extremities with other complications 09/02/2012  . Pericarditis 10/05/2010  . Chest pain 06/15/2010  . Edema 06/15/2010  . Thyroid disease 06/15/2010   Lavinia Sharps, PT 03/21/17 4:47 PM Phone: [phone] Fax: [phone]  Vivien Presto 03/21/2017, 4:46 PM  [facility] [address], [address] [place], [place], [address] Phone: [phone]   Fax:  [phone]  Name: Any Mcneice MRN: [id number] Date of Birth: 1955/12/16

## 2017-03-26 ENCOUNTER — Encounter: Payer: Self-pay | Admitting: Physical Therapy

## 2017-03-26 ENCOUNTER — Ambulatory Visit: Payer: BLUE CROSS/BLUE SHIELD | Admitting: Physical Therapy

## 2017-03-26 DIAGNOSIS — M6281 Muscle weakness (generalized): Secondary | ICD-10-CM

## 2017-03-26 DIAGNOSIS — M545 Low back pain: Principal | ICD-10-CM

## 2017-03-26 DIAGNOSIS — G8929 Other chronic pain: Secondary | ICD-10-CM

## 2017-03-26 DIAGNOSIS — M62830 Muscle spasm of back: Secondary | ICD-10-CM

## 2017-03-26 DIAGNOSIS — M25562 Pain in left knee: Secondary | ICD-10-CM

## 2017-03-26 NOTE — Therapy (Signed)
[facility] [address] [place], [place], [address] Phone: [phone]   Fax:  [phone]  Physical Therapy Treatment  Patient Details  Name: Cheryl [name] MRN: [id number] Date of Birth: Aug 13, 1955 Referring Provider: Benedetto Goad, MD    Encounter Date: 03/26/2017  PT End of Session - 03/26/17 1544    Visit Number  17    Date for PT Re-Evaluation  04/09/17    Authorization Type  BCBS    PT Start Time  1515    PT Stop Time  1600    PT Time Calculation (min)  45 min    Activity Tolerance  Patient tolerated treatment well       Past Medical History:  Diagnosis Date  . Anemia   . Chest pain   . Chronic dermatitis   . Edema of both legs   . Grave's disease   . HLD (hyperlipidemia)   . HTN (hypertension)   . Obesity   . Varicose veins     Past Surgical History:  Procedure Laterality Date  . CHOLECYSTECTOMY    . ENDOVENOUS ABLATION SAPHENOUS VEIN W/ LASER    . SKIN GRAFT      There were no vitals filed for this visit.  Subjective Assessment - 03/26/17 1521    Subjective  I don't want to do the leg press machine today, I wonder if that makes my back hurt.      Currently in Pain?  Yes    Pain Score  6     Pain Location  Back    Aggravating Factors   standing                      [facility] Adult PT Treatment/Exercise - 03/26/17 0001      Lumbar Exercises: Stretches   Active Hamstring Stretch  Right;Left;5 reps seated reaching with 1 hand      Lumbar Exercises: Aerobic   UBE (Upper Arm Bike)  2 min forward/2 backward      Lumbar Exercises: Seated   Other Seated Lumbar Exercises  seated ankle rocker board       Lumbar Exercises: Supine   Other Supine Lumbar Exercises  green ball rolls 10x; lumbar rotation on ball     Other Supine Lumbar Exercises  HS sets on green ball 10x      Knee/Hip Exercises: Aerobic   Nustep  L2 10 min      Knee/Hip Exercises: Seated   Long Arc Quad   Strengthening;Right;Left;15 reps    Long Arc Quad Weight  3 lbs.    Clamshell with TheraBand  Blue single and double 30x    Other Seated Knee/Hip Exercises  single leg press with green band 15x     Other Seated Knee/Hip Exercises  hip extension push into green band 10x right/left therapist holding     Marching  Right;Left;15 reps    Marching Weights  3 lbs.    Hamstring Curl  Strengthening;Right;Left;15 reps    Hamstring Limitations  green band               PT Short Term Goals - 03/14/17 1558      PT SHORT TERM GOAL #1   Title  Pt will demo consistency and independence with her HEP to improve LE strength and decrease pain.     Status  Achieved      PT SHORT TERM GOAL #2   Title  Pt will report atleast  25% improvement in her low back pain/activity tolerance from the start of therapy.     Status  Achieved      PT SHORT TERM GOAL #3   Title  Pt will demo improved mechanics with transitions sit to/from supine with proper log roll technique without assistance from the therapist, which will decrease strain on her low back.     Status  Achieved        PT Long Term Goals - 03/21/17 1645      PT LONG TERM GOAL #1   Title  Pt will be independent with more advanced HEP to prepare for discharge from PT.     Time  8    Period  Weeks    Status  On-going      PT LONG TERM GOAL #2   Title  Pt will score atleast 50% or less impaired on FOTO to demo increased functional mobility.     Time  8    Period  Weeks    Status  On-going      PT LONG TERM GOAL #3   Title  Pt will report improved standing tolerance at work to atleast 30 min without increase in her low back pain.     Status  Deferred      PT LONG TERM GOAL #4   Title  Pt will complete 5x sit to stand in less than 15 sec without UE support, to reflect an improvement in her functional strength.     Time  8    Period  Weeks    Status  Revised      PT LONG TERM GOAL #5   Title  Pt will complete the TUG in less than 14  sec without UE support, to reflect an improvement in her balance and mobility.     Time  8    Period  Weeks    Status  On-going      PT LONG TERM GOAL #6   Title  Patient will be able to stand 12-15 minutes to wash a sink full of dishes    Time  8    Period  Weeks    Status  On-going      PT LONG TERM GOAL #7   Title  Six minute walk test 650 feet indicating improved gait speed needed for community ambulation    Time  8    Period  Weeks    Status  On-going            Plan - 03/26/17 1545    Clinical Impression Statement  The patient continues to complain of multi region body pain in back and LEs.  She attributes her increase in LBP and feeling extra tired over the weekend to the leg press machine and wants to avoid this machine today.  Her progress per session has been slow however she reports overall functional improvements in ability to sleep and get up from the sofa/chair at home.  Therapist closely monitoring response and modifying as needed.  Patient reports she feels better after session.      Rehab Potential  Fair    PT Frequency  2x / week    PT Duration  6 weeks    PT Treatment/Interventions  ADLs/Self Care Home Management;Cryotherapy;Traction;Moist Heat;Iontophoresis 4mg /ml Dexamethasone;Electrical Stimulation;Gait training;Neuromuscular re-education;Stair training;Functional mobility training;Balance training;Therapeutic exercise;Therapeutic activities;Patient/family education;Manual techniques;Passive range of motion;Dry needling    PT Next Visit Plan  assess progress toward goals;  do 6 min walk test  and recheck ROM;  open chain strengthening and core strengthening       Patient will benefit from skilled therapeutic intervention in order to improve the following deficits and impairments:  Abnormal gait, Decreased balance, Decreased endurance, Decreased mobility, Difficulty walking, Hypomobility, Increased muscle spasms, Improper body mechanics, Obesity, Decreased range  of motion, Decreased strength, Impaired flexibility, Postural dysfunction, Pain  Visit Diagnosis: Chronic bilateral low back pain, with sciatica presence unspecified  Muscle spasm of back  Muscle weakness (generalized)  Chronic pain of left knee     Problem List Patient Active Problem List   Diagnosis Date Noted  . 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 Leg edema 09/02/2012  . Varicose veins of lower extremities with other complications 09/02/2012  . Pericarditis 10/05/2010  . Chest pain 06/15/2010  . Edema 06/15/2010  . Thyroid disease 06/15/2010   Lavinia Sharps, PT 03/26/17 4:00 PM Phone: [phone] Fax: [phone]  Vivien Presto 03/26/2017, 3:59 PM  [facility] [address], [address] [place], [place], [address] Phone: [phone]   Fax:  [phone]  Name: Cheryl [name] MRN: [id number] Date of Birth: [birth year]

## 2017-03-28 ENCOUNTER — Encounter: Payer: Self-pay | Admitting: Physical Therapy

## 2017-03-28 ENCOUNTER — Ambulatory Visit: Payer: BLUE CROSS/BLUE SHIELD | Admitting: Physical Therapy

## 2017-03-28 DIAGNOSIS — M6281 Muscle weakness (generalized): Secondary | ICD-10-CM

## 2017-03-28 DIAGNOSIS — G8929 Other chronic pain: Secondary | ICD-10-CM

## 2017-03-28 DIAGNOSIS — M62830 Muscle spasm of back: Secondary | ICD-10-CM

## 2017-03-28 DIAGNOSIS — M25562 Pain in left knee: Secondary | ICD-10-CM

## 2017-03-28 DIAGNOSIS — M545 Low back pain: Secondary | ICD-10-CM | POA: Diagnosis not present

## 2017-03-28 NOTE — Therapy (Signed)
[facility] [address], [facility] [address] [place], [place], [address] Phone: [phone]   Fax:  [phone]  Physical Therapy Treatment  Patient Details  Name: Cheryl BachelorBrenda [name] MRN: [id number] Date of Birth: 1955-04-21 Referring Provider: Benedetto GoadFred Wilson, MD    Encounter Date: 03/28/2017  PT End of Session - 03/28/17 1632    Visit Number  18    Date for PT Re-Evaluation  04/09/17    Authorization Type  BCBS    PT Start Time  1610    PT Stop Time  1650    PT Time Calculation (min)  40 min    Activity Tolerance  Patient tolerated treatment well       Past Medical History:  Diagnosis Date  . Anemia   . Chest pain   . Chronic dermatitis   . Edema of both legs   . Grave's disease   . HLD (hyperlipidemia)   . HTN (hypertension)   . Obesity   . Varicose veins     Past Surgical History:  Procedure Laterality Date  . CHOLECYSTECTOMY    . ENDOVENOUS ABLATION SAPHENOUS VEIN W/ LASER    . SKIN GRAFT      There were no vitals filed for this visit.  Subjective Assessment - 03/28/17 1613    Subjective  I don't think it was the leg press that bothered me after all.  I think it was the exercise on top of working.  My back, hip and calf hurt today.      Pertinent History  chest pain, HTN  MD 12/26    Currently in Pain?  Yes    Pain Score  7     Pain Location  Back    Pain Type  Chronic pain                      [facility] Adult PT Treatment/Exercise - 03/28/17 0001      Lumbar Exercises: Aerobic   UBE (Upper Arm Bike)  2 min forward/2 backward      Lumbar Exercises: Machines for Strengthening   Cybex Knee Flexion  15# 20x    Leg Press  seat 8 50# bil 15X' single leg 30# 10x each leg       Knee/Hip Exercises: Aerobic   Nustep  L2 10 min      Knee/Hip Exercises: Seated   Long Arc Quad  Strengthening;Right;Left;15 reps    Long Arc Quad Weight  3 lbs.    Clamshell with TheraBand  Blue single and double 30x    Other  Seated Knee/Hip Exercises  single leg press with green band 15x     Other Seated Knee/Hip Exercises  hip extension push into green band 10x right/left therapist holding     Marching  Right;Left;15 reps    Marching Weights  3 lbs.    Sit to Sand  5 reps from foam pad               PT Short Term Goals - 03/14/17 1558      PT SHORT TERM GOAL #1   Title  Pt will demo consistency and independence with her HEP to improve LE strength and decrease pain.     Status  Achieved      PT SHORT TERM GOAL #2   Title  Pt will report atleast 25% improvement in her low back pain/activity tolerance from the start of therapy.     Status  Achieved  PT SHORT TERM GOAL #3   Title  Pt will demo improved mechanics with transitions sit to/from supine with proper log roll technique without assistance from the therapist, which will decrease strain on her low back.     Status  Achieved        PT Long Term Goals - 03/21/17 1645      PT LONG TERM GOAL #1   Title  Pt will be independent with more advanced HEP to prepare for discharge from PT.     Time  8    Period  Weeks    Status  On-going      PT LONG TERM GOAL #2   Title  Pt will score atleast 50% or less impaired on FOTO to demo increased functional mobility.     Time  8    Period  Weeks    Status  On-going      PT LONG TERM GOAL #3   Title  Pt will report improved standing tolerance at work to atleast 30 min without increase in her low back pain.     Status  Deferred      PT LONG TERM GOAL #4   Title  Pt will complete 5x sit to stand in less than 15 sec without UE support, to reflect an improvement in her functional strength.     Time  8    Period  Weeks    Status  Revised      PT LONG TERM GOAL #5   Title  Pt will complete the TUG in less than 14 sec without UE support, to reflect an improvement in her balance and mobility.     Time  8    Period  Weeks    Status  On-going      PT LONG TERM GOAL #6   Title  Patient will be  able to stand 12-15 minutes to wash a sink full of dishes    Time  8    Period  Weeks    Status  On-going      PT LONG TERM GOAL #7   Title  Six minute walk test 650 feet indicating improved gait speed needed for community ambulation    Time  8    Period  Weeks    Status  On-going            Plan - 03/28/17 1633    Clinical Impression Statement  Despite patient's moderate pain intensity in back and LEs, she is able to participate in a moderate intensity exercise program.  She responds fairly well to open chain strengthening but standing is limited to 10 minutes.      Rehab Potential  Fair    PT Frequency  2x / week    PT Duration  6 weeks    PT Treatment/Interventions  ADLs/Self Care Home Management;Cryotherapy;Traction;Moist Heat;Iontophoresis 4mg /ml Dexamethasone;Electrical Stimulation;Gait training;Neuromuscular re-education;Stair training;Functional mobility training;Balance training;Therapeutic exercise;Therapeutic activities;Patient/family education;Manual techniques;Passive range of motion;Dry needling    PT Next Visit Plan  assess progress toward goals;  do 6 min walk test;  open chain strengthening and core strengthening;  prepare for discharge to independent to HEP in 2 visits.         Patient will benefit from skilled therapeutic intervention in order to improve the following deficits and impairments:  Abnormal gait, Decreased balance, Decreased endurance, Decreased mobility, Difficulty walking, Hypomobility, Increased muscle spasms, Improper body mechanics, Obesity, Decreased range of motion, Decreased strength, Impaired flexibility, Postural dysfunction, Pain  Visit Diagnosis: Chronic bilateral low back pain, with sciatica presence unspecified  Muscle spasm of back  Muscle weakness (generalized)  Chronic pain of left knee     Problem List Patient Active Problem List   Diagnosis Date Noted  . 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 Leg edema  09/02/2012  . Varicose veins of lower extremities with other complications 09/02/2012  . Pericarditis 10/05/2010  . Chest pain 06/15/2010  . Edema 06/15/2010  . Thyroid disease 06/15/2010   Lavinia Sharps, PT 03/28/17 4:48 PM Phone: [phone] Fax: [phone]  Vivien Presto 03/28/2017, 4:48 PM  [facility] [address], [address] [place], [place], [address] Phone: [phone]   Fax:  [phone]  Name: Cheryl [name] MRN: [id number] Date of Birth: 11-07-1955

## 2017-04-04 ENCOUNTER — Encounter: Payer: Self-pay | Admitting: Physical Therapy

## 2017-04-04 ENCOUNTER — Ambulatory Visit: Payer: BLUE CROSS/BLUE SHIELD | Admitting: Physical Therapy

## 2017-04-04 DIAGNOSIS — M6281 Muscle weakness (generalized): Secondary | ICD-10-CM

## 2017-04-04 DIAGNOSIS — M25562 Pain in left knee: Secondary | ICD-10-CM

## 2017-04-04 DIAGNOSIS — M545 Low back pain: Secondary | ICD-10-CM | POA: Diagnosis not present

## 2017-04-04 DIAGNOSIS — M62830 Muscle spasm of back: Secondary | ICD-10-CM

## 2017-04-04 DIAGNOSIS — G8929 Other chronic pain: Secondary | ICD-10-CM

## 2017-04-04 NOTE — Therapy (Signed)
[facility] [address], [address] [place], [place], [address] Phone: [phone]   Fax:  [phone]  Physical Therapy Treatment  Patient Details  Name: Cheryl [name] MRN: [id number] Date of Birth: 12-01-1955 Referring Provider: Benedetto Goad, MD    Encounter Date: 04/04/2017  PT End of Session - 04/04/17 1549    Visit Number  19    Date for PT Re-Evaluation  04/09/17    Authorization Type  BCBS    PT Start Time  1520    PT Stop Time  1600    PT Time Calculation (min)  40 min    Activity Tolerance  Patient tolerated treatment well       Past Medical History:  Diagnosis Date  . Anemia   . Chest pain   . Chronic dermatitis   . Edema of both legs   . Grave's disease   . HLD (hyperlipidemia)   . HTN (hypertension)   . Obesity   . Varicose veins     Past Surgical History:  Procedure Laterality Date  . CHOLECYSTECTOMY    . ENDOVENOUS ABLATION SAPHENOUS VEIN W/ LASER    . SKIN GRAFT      There were no vitals filed for this visit.  Subjective Assessment - 04/04/17 1521    Subjective  Going to see the doctor on 3/27.  Today is "pretty good."  Some pain across the stomach and "I haven't be able to (go to the bathroom) like I usually do for a couple of months."  My legs aren't as bad today.   I took 3 Advil 30 minutes ago.      Currently in Pain?  Yes    Pain Score  5     Pain Location  Leg    Pain Orientation  Right;Left    Aggravating Factors   being on my feet    Pain Relieving Factors  taking Advil                      [facility] Adult PT Treatment/Exercise - 04/04/17 0001      Lumbar Exercises: Aerobic   UBE (Upper Arm Bike)  2 min forward/2 backward      Lumbar Exercises: Machines for Strengthening   Cybex Knee Flexion  20# 20x    Leg Press  seat 8 50# bil 15X' single leg 30# 10x each leg       Lumbar Exercises: Seated   Other Seated Lumbar Exercises  marching seated 1 minute    Other Seated  Lumbar Exercises  seated ankle rocker board       Knee/Hip Exercises: Aerobic   Nustep  L2 10 min      Knee/Hip Exercises: Seated   Long Arc Quad  Strengthening;Right;Left;15 reps    Long Arc Quad Weight  4 lbs.    Clamshell with TheraBand  Blue single and double 30x    Other Seated Knee/Hip Exercises  single leg press with green band 15x     Other Seated Knee/Hip Exercises  hip extension push into green band 10x right/left therapist holding     Sit to Sand  5 reps from foam pad               PT Short Term Goals - 03/14/17 1558      PT SHORT TERM GOAL #1   Title  Pt will demo consistency and independence with her HEP to improve LE strength and decrease pain.  Status  Achieved      PT SHORT TERM GOAL #2   Title  Pt will report atleast 25% improvement in her low back pain/activity tolerance from the start of therapy.     Status  Achieved      PT SHORT TERM GOAL #3   Title  Pt will demo improved mechanics with transitions sit to/from supine with proper log roll technique without assistance from the therapist, which will decrease strain on her low back.     Status  Achieved        PT Long Term Goals - 03/21/17 1645      PT LONG TERM GOAL #1   Title  Pt will be independent with more advanced HEP to prepare for discharge from PT.     Time  8    Period  Weeks    Status  On-going      PT LONG TERM GOAL #2   Title  Pt will score atleast 50% or less impaired on FOTO to demo increased functional mobility.     Time  8    Period  Weeks    Status  On-going      PT LONG TERM GOAL #3   Title  Pt will report improved standing tolerance at work to atleast 30 min without increase in her low back pain.     Status  Deferred      PT LONG TERM GOAL #4   Title  Pt will complete 5x sit to stand in less than 15 sec without UE support, to reflect an improvement in her functional strength.     Time  8    Period  Weeks    Status  Revised      PT LONG TERM GOAL #5   Title  Pt  will complete the TUG in less than 14 sec without UE support, to reflect an improvement in her balance and mobility.     Time  8    Period  Weeks    Status  On-going      PT LONG TERM GOAL #6   Title  Patient will be able to stand 12-15 minutes to wash a sink full of dishes    Time  8    Period  Weeks    Status  On-going      PT LONG TERM GOAL #7   Title  Six minute walk test 650 feet indicating improved gait speed needed for community ambulation    Time  8    Period  Weeks    Status  On-going            Plan - 04/04/17 1549    Clinical Impression Statement  The patient is able to perform a progression of core and LE strengthening exercises without complaint of increased pain today.    Still with general LE stiffness especially with sit to stand.  Treatment focus remains on open chain strengthening secondary to intolerance of weight bearing /closed chain.  Plan for discharge to independent HEP next visit.      Rehab Potential  Fair    PT Frequency  2x / week    PT Duration  6 weeks    PT Treatment/Interventions  ADLs/Self Care Home Management;Cryotherapy;Traction;Moist Heat;Iontophoresis 4mg /ml Dexamethasone;Electrical Stimulation;Gait training;Neuromuscular re-education;Stair training;Functional mobility training;Balance training;Therapeutic exercise;Therapeutic activities;Patient/family education;Manual techniques;Passive range of motion;Dry needling    PT Next Visit Plan  check ROM, MMT;   do 6 min walk test first;  open chain  strengthening and core strengthening; probable discharge       Patient will benefit from skilled therapeutic intervention in order to improve the following deficits and impairments:  Abnormal gait, Decreased balance, Decreased endurance, Decreased mobility, Difficulty walking, Hypomobility, Increased muscle spasms, Improper body mechanics, Obesity, Decreased range of motion, Decreased strength, Impaired flexibility, Postural dysfunction, Pain  Visit  Diagnosis: Chronic bilateral low back pain, with sciatica presence unspecified  Muscle spasm of back  Muscle weakness (generalized)  Chronic pain of left knee     Problem List Patient Active Problem List   Diagnosis Date Noted  . 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 Leg edema 09/02/2012  . Varicose veins of lower extremities with other complications 09/02/2012  . Pericarditis 10/05/2010  . Chest pain 06/15/2010  . Edema 06/15/2010  . Thyroid disease 06/15/2010   Lavinia Sharps, PT 04/04/17 3:55 PM Phone: [phone] Fax: [phone]  Vivien Presto 04/04/2017, 3:55 PM   [facility] [address], [address] [place], [place], [address] Phone: [phone]   Fax:  [phone]  Name: Yasira Engelson MRN: [id number] Date of Birth: [birth year]

## 2017-04-09 ENCOUNTER — Ambulatory Visit: Payer: BLUE CROSS/BLUE SHIELD | Attending: Family Medicine | Admitting: Physical Therapy

## 2017-04-09 ENCOUNTER — Encounter: Payer: Self-pay | Admitting: Physical Therapy

## 2017-04-09 DIAGNOSIS — M62830 Muscle spasm of back: Secondary | ICD-10-CM | POA: Diagnosis present

## 2017-04-09 DIAGNOSIS — M6281 Muscle weakness (generalized): Secondary | ICD-10-CM | POA: Insufficient documentation

## 2017-04-09 DIAGNOSIS — M545 Low back pain: Secondary | ICD-10-CM | POA: Insufficient documentation

## 2017-04-09 DIAGNOSIS — M25562 Pain in left knee: Secondary | ICD-10-CM | POA: Insufficient documentation

## 2017-04-09 DIAGNOSIS — G8929 Other chronic pain: Secondary | ICD-10-CM | POA: Insufficient documentation

## 2017-04-09 NOTE — Patient Instructions (Signed)
    Use band 3-4x/week (blue is the hardest)   Seated with band:    1)  Clams    2)  Hold band in hands and press outward "like stomping on the brake"    3)  Someone can hold the band and you pull back or tie the band around a table leg   Put pillow or blanket  in chair so that hips are higher than the knees and go from sit to stand 5x without using arms   Lavinia SharpsStacy Marchel Foote PT [facility] [address], [address] [place], [place] [address] Phone # [phone] Fax [phone]

## 2017-04-09 NOTE — Therapy (Signed)
[facility] [address], [place], [name], [address] Phone: [phone]   Fax:  [phone]  Physical Therapy Treatment/Discharge Summary  Patient Details  Name: Cheryl [name] MRN: [id number] Date of Birth: [birth year] Referring Provider: Kathryne Eriksson, MD    Encounter Date: 04/09/2017  PT End of Session - 04/09/17 1616    Visit Number  20    Date for PT Re-Evaluation  04/09/17    Authorization Type  BCBS    PT Start Time  1530    PT Stop Time  1610    PT Time Calculation (min)  40 min    Activity Tolerance  Patient tolerated treatment well       Past Medical History:  Diagnosis Date  . Anemia   . Chest pain   . Chronic dermatitis   . Edema of both legs   . Grave's disease   . HLD (hyperlipidemia)   . HTN (hypertension)   . Obesity   . Varicose veins     Past Surgical History:  Procedure Laterality Date  . CHOLECYSTECTOMY    . ENDOVENOUS ABLATION SAPHENOUS VEIN W/ LASER    . SKIN GRAFT      There were no vitals filed for this visit.  Subjective Assessment - 04/09/17 1534    Subjective  My (pain) is further up today.      Currently in Pain?  Yes    Pain Score  8     Pain Location  Leg    Pain Orientation  Right;Left    Pain Type  Chronic pain         [facility] PT Assessment - 04/09/17 0001      Observation/Other Assessments   Focus on Therapeutic Outcomes (FOTO)   53 % limitation       AROM   Right Knee Extension  12    Right Knee Flexion  105    Left Knee Extension  14    Left Knee Flexion  112    Lumbar Flexion  80    Lumbar Extension  10      Strength   Right Knee Flexion  4+/5    Right Knee Extension  4+/5    Left Knee Flexion  4+/5    Left Knee Extension  4+/5      6 Minute Walk- Baseline   BP (mmHg)  -- 720 feet 1 rest break      Standardized Balance Assessment   Five times sit to stand comments   18 sec      Timed Up and Go Test   TUG Comments  12.04                   [facility] Adult PT Treatment/Exercise - 04/09/17 0001      Knee/Hip Exercises: Aerobic   Nustep  L2 5 min       Knee/Hip Exercises: Seated   Other Seated Knee/Hip Exercises  single leg press with green band 15x     Hamstring Curl  Strengthening;Right;Left;15 reps    Hamstring Limitations  green band             PT Education - 04/09/17 1615    Education provided  Yes    Education Details  review of seated band exercises for HEP   Person(s) Educated  Patient    Methods  Explanation;Handout    Comprehension  Returned demonstration;Verbalized understanding       PT Short Term Goals -  04/09/17 1620      PT SHORT TERM GOAL #1   Title  Pt will demo consistency and independence with her HEP to improve LE strength and decrease pain.     Status  Achieved      PT SHORT TERM GOAL #2   Title  Pt will report atleast 25% improvement in her low back pain/activity tolerance from the start of therapy.     Status  Achieved      PT SHORT TERM GOAL #3   Title  Pt will demo improved mechanics with transitions sit to/from supine with proper log roll technique without assistance from the therapist, which will decrease strain on her low back.     Status  Achieved      PT SHORT TERM GOAL #4   Title  Pt will demo improved hip extension ROM to atleast neutral during flexibility testing, which will increase her efficiency with ambulation.     Status  Achieved        PT Long Term Goals - 04/09/17 1553      PT LONG TERM GOAL #1   Title  Pt will be independent with more advanced HEP to prepare for discharge from PT.     Status  Achieved      PT LONG TERM GOAL #2   Title  Pt will score atleast 50% or less impaired on FOTO to demo increased functional mobility.     Status  Partially Met      PT LONG TERM GOAL #3   Title  Pt will report improved standing tolerance at work to atleast 30 min without increase in her low back pain.     Baseline  leans on table at work 15  min    Status  Deferred      PT LONG TERM GOAL #4   Title  Pt will complete 5x sit to stand in less than 15 sec without UE support, to reflect an improvement in her functional strength.     Status  Partially Met      PT LONG TERM GOAL #5   Title  Pt will complete the TUG in less than 14 sec without UE support, to reflect an improvement in her balance and mobility.     Status  Achieved      PT LONG TERM GOAL #6   Title  Patient will be able to stand 12-15 minutes to wash a sink full of dishes    Baseline  Able to stand 15 min this past weekend    Status  Achieved      PT LONG TERM GOAL #7   Title  Six minute walk test 650 feet indicating improved gait speed needed for community ambulation    Status  Achieved            Plan - 04/09/17 1616    Clinical Impression Statement  The patient has made good improvements in 6 min walk test from 560 feet to 720 feet.  Her Timed up and Go test has improved by 2.5 sec as well as her sit to stand test.  She has made good improvements in LE ROM and strength as well.  She continues to have "stenosis-like" back and LE pain with standing.  She was able to stand at the sink for 15 min to wash dishes this past weekend but standing at work for long periods of time continues to be painful.  She met the majority of goals at this  time but she should make further gains as she continues with her HEP.  Recommend discharge from PT at this time.          PHYSICAL THERAPY DISCHARGE SUMMARY  Visits from Start of Care: 20  Current functional level related to goals / functional outcomes: See clinical impressions above   Remaining deficits: As above   Education / Equipment: Comprehensive HEP Plan: Patient agrees to discharge.  Patient goals were met. Patient is being discharged due to meeting the stated rehab goals.  ?????        Patient will benefit from skilled therapeutic intervention in order to improve the following deficits and impairments:      Visit Diagnosis: Chronic bilateral low back pain, with sciatica presence unspecified  Muscle spasm of back  Muscle weakness (generalized)  Chronic pain of left knee     Problem List Patient Active Problem List   Diagnosis Date Noted  . Varicose veins of leg with complications 80/63/8685  . Leg edema 09/02/2012  . Varicose veins of lower extremities with other complications 48/83/0141  . Pericarditis 10/05/2010  . Chest pain 06/15/2010  . Edema 06/15/2010  . Thyroid disease 06/15/2010   Ruben Im, PT 04/09/17 4:23 PM Phone: [phone] Fax: [phone]  Alvera Singh 04/09/2017, 4:21 PM  [facility] [address], [place], [name], [address] Phone: [phone]   Fax:  [phone]  Name: Erandi Lemma MRN: [id number] Date of Birth: 1955-11-12

## 2017-04-22 ENCOUNTER — Ambulatory Visit (HOSPITAL_COMMUNITY)
Admission: EM | Admit: 2017-04-22 | Discharge: 2017-04-22 | Disposition: A | Discharge status: Home / Self Care | Payer: BLUE CROSS/BLUE SHIELD | Source: Home / Self Care

## 2017-04-22 ENCOUNTER — Emergency Department (HOSPITAL_COMMUNITY): Payer: BLUE CROSS/BLUE SHIELD

## 2017-04-22 ENCOUNTER — Other Ambulatory Visit: Payer: Self-pay

## 2017-04-22 ENCOUNTER — Encounter (HOSPITAL_COMMUNITY): Payer: Self-pay

## 2017-04-22 ENCOUNTER — Emergency Department (HOSPITAL_COMMUNITY)
Admission: EM | Admit: 2017-04-22 | Discharge: 2017-04-23 | Disposition: A | Discharge status: Home / Self Care | Payer: BLUE CROSS/BLUE SHIELD | Attending: Emergency Medicine | Admitting: Emergency Medicine

## 2017-04-22 DIAGNOSIS — Z7982 Long term (current) use of aspirin: Secondary | ICD-10-CM | POA: Insufficient documentation

## 2017-04-22 DIAGNOSIS — R0602 Shortness of breath: Secondary | ICD-10-CM | POA: Insufficient documentation

## 2017-04-22 DIAGNOSIS — R0789 Other chest pain: Secondary | ICD-10-CM | POA: Diagnosis present

## 2017-04-22 DIAGNOSIS — R079 Chest pain, unspecified: Secondary | ICD-10-CM

## 2017-04-22 DIAGNOSIS — Z79899 Other long term (current) drug therapy: Secondary | ICD-10-CM | POA: Insufficient documentation

## 2017-04-22 DIAGNOSIS — I10 Essential (primary) hypertension: Secondary | ICD-10-CM | POA: Diagnosis not present

## 2017-04-22 LAB — CBC
HEMATOCRIT: 37.8 % (ref 36.0–46.0)
HEMOGLOBIN: 12.6 g/dL (ref 12.0–15.0)
MCH: 29.2 pg (ref 26.0–34.0)
MCHC: 33.3 g/dL (ref 30.0–36.0)
MCV: 87.5 fL (ref 78.0–100.0)
Platelets: 278 10*3/uL (ref 150–400)
RBC: 4.32 MIL/uL (ref 3.87–5.11)
RDW: 14.1 % (ref 11.5–15.5)
WBC: 8.8 10*3/uL (ref 4.0–10.5)

## 2017-04-22 LAB — BASIC METABOLIC PANEL
ANION GAP: 13 (ref 5–15)
BUN: 12 mg/dL (ref 6–20)
CALCIUM: 8.9 mg/dL (ref 8.9–10.3)
CHLORIDE: 103 mmol/L (ref 101–111)
CO2: 22 mmol/L (ref 22–32)
Creatinine, Ser: 0.78 mg/dL (ref 0.44–1.00)
GFR calc Af Amer: >60 mL/min (ref >60)
GFR calc non Af Amer: >60 mL/min (ref >60)
GLUCOSE: 99 mg/dL (ref 65–99)
Potassium: 3.9 mmol/L (ref 3.5–5.1)
Sodium: 138 mmol/L (ref 135–145)

## 2017-04-22 LAB — I-STAT TROPONIN, ED: TROPONIN I, POC: 0 ng/mL (ref 0.00–0.08)

## 2017-04-22 LAB — D-DIMER, QUANTITATIVE (NOT AT ARMC): D DIMER QUANT: 0.53 ug{FEU}/mL — AB (ref 0.00–0.50)

## 2017-04-22 LAB — BRAIN NATRIURETIC PEPTIDE: B NATRIURETIC PEPTIDE 5: 45.1 pg/mL (ref 0.0–100.0)

## 2017-04-22 MED ORDER — ASPIRIN EC 325 MG PO TBEC
325.0000 mg | DELAYED_RELEASE_TABLET | Freq: Once | ORAL | Status: AC
  Administered 2017-04-22: 325 mg via ORAL
  Filled 2017-04-22: qty 1

## 2017-04-22 MED ORDER — IOPAMIDOL (ISOVUE-370) INJECTION 76%
INTRAVENOUS | Status: AC
  Administered 2017-04-22: 100 mL
  Filled 2017-04-22: qty 100

## 2017-04-22 NOTE — ED Provider Notes (Signed)
[facility] EMERGENCY DEPARTMENT Provider Note   CSN: [id number] Arrival date & time: 04/22/17  1735     History   Chief Complaint Chief Complaint  Patient presents with  . Chest Pain  . Shortness of Breath    HPI Cheryl [name] is a 62 y.o. female.  HPI   Pt is a 62 y/o female who presents to the ED c/o 6/10 bilateral upper and left-sided chest pain that has been ongoing for the last 2 days.  States it is intermittent and pain is worse with movement, palpation, and exertion.  States pain intermittently radiates to her left arm.  She also reports dyspnea on exertion for the same time period. Denies any shortness of breath at rest.  Denies any fevers, cough, congestion, or other URI symptoms.  Does note some right mid back pain (worse with movement), mild lower abdominal pain, and nausea that began this morning.  Denies any vomiting, diarrhea, constipation, blood in stool, urinary or vaginal symptoms.  States she has had chronic bilateral lower extremity swelling which is at its baseline.  States left leg is more swollen than right which is chronic for years.  Past Medical History:  Diagnosis Date  . Anemia   . Chest pain   . Chronic dermatitis   . Edema of both legs   . Grave's disease   . HLD (hyperlipidemia)   . HTN (hypertension)   . Obesity   . Varicose veins     Patient Active Problem List   Diagnosis Date Noted  . 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 Leg edema 09/02/2012  . Varicose veins of lower extremities with other complications 09/02/2012  . Pericarditis 10/05/2010  . Chest pain 06/15/2010  . Edema 06/15/2010  . Thyroid disease 06/15/2010    Past Surgical History:  Procedure Laterality Date  . CHOLECYSTECTOMY    . ENDOVENOUS ABLATION SAPHENOUS VEIN W/ LASER    . SKIN GRAFT      OB History    No data available       Home Medications    Prior to Admission medications   Medication Sig Start Date End Date  Taking? Authorizing Provider  acetaminophen (TYLENOL) 500 MG tablet Take 500 mg by mouth every 6 (six) hours as needed.     Yes [provider]  aspirin 325 MG tablet Take 325 mg by mouth daily.   Yes [provider]  ibuprofen (ADVIL,MOTRIN) 200 MG tablet Take 200 mg by mouth every 6 (six) hours as needed. Reported on 06/05/2015   Yes [provider]  levothyroxine (SYNTHROID, LEVOTHROID) 175 MCG tablet Take 175 mcg by mouth daily.  06/04/14  Yes [provider]  phentermine (ADIPEX-P) 37.5 MG tablet Take 37.5 mg by mouth daily. 03/25/17  Yes [provider]  cyclobenzaprine (FLEXERIL) 5 MG tablet Take 0.5-1 tablets (2.5-5 mg total) by mouth at bedtime. Patient not taking: Reported on 04/22/2017 08/29/16   Belinda Fisher, PA-C  diclofenac sodium (VOLTAREN) 1 % GEL Apply 2 g topically 4 (four) times daily. Patient not taking: Reported on 04/22/2017 06/05/15   [place] Mam    Family History Family History  Problem Relation Age of Onset  . Cancer Mother        lung  . Other Mother        varicose veins  . Varicose Veins Mother   . Heart disease Father   . Hyperlipidemia Father   . Hypertension Father   .  Other Father        pvd  . Heart attack Father   . Peripheral vascular disease Father        amputation  . Cancer Sister   . Coronary artery disease Unknown   . Cancer Sister   . Breast cancer Neg Hx     Social History Social History   Tobacco Use  . Smoking status: Never Smoker  . Smokeless tobacco: Never Used  Substance Use Topics  . Alcohol use: No    Alcohol/week: 0.0 oz  . Drug use: No     Allergies   Antihistamines, chlorpheniramine-type; Simvastatin; and Terbinafine   Review of Systems Review of Systems  Constitutional: Positive for fatigue. Negative for chills, diaphoresis and fever.  HENT: Negative for congestion, rhinorrhea, sinus pain and sore throat.   Eyes: Negative for visual disturbance.  Respiratory:  Positive for chest tightness and shortness of breath. Negative for cough and wheezing.   Cardiovascular: Positive for chest pain and leg swelling. Negative for palpitations.  Gastrointestinal: Positive for abdominal pain, nausea and vomiting. Negative for blood in stool, constipation and diarrhea.  Genitourinary: Negative for dysuria, hematuria and urgency.  Musculoskeletal: Positive for back pain. Negative for neck pain.  Skin: Negative for rash.  Neurological: Negative for dizziness, weakness, light-headedness, numbness and headaches.     Physical Exam Updated Vital Signs BP (!) 145/64   Pulse 82   Temp 97.7 F (36.5 C) (Core (Comment))   Resp 13   Ht 5\' 7"  (1.702 m)   Wt 127 kg (280 lb)   SpO2 98%   BMI 43.85 kg/m   Physical Exam  Constitutional: She appears well-developed and well-nourished. No distress.  Non toxic  HENT:  Head: Normocephalic and atraumatic.  Eyes: Conjunctivae are normal.  Neck: Normal range of motion. Neck supple.  Cardiovascular: Normal rate, regular rhythm and normal pulses.  No murmur heard. Pulses:      Radial pulses are 2+ on the right side, and 2+ on the left side.       Dorsalis pedis pulses are 2+ on the right side, and 2+ on the left side.  Pulmonary/Chest: Effort normal and breath sounds normal. No respiratory distress. She has no decreased breath sounds. She has no wheezes. She has no rhonchi. She has no rales.  Abdominal: Soft. Bowel sounds are normal. She exhibits no distension. There is no tenderness.  Musculoskeletal: She exhibits no edema.  2-3+ pitting edema to the BLE up to the mid calves. No ttp to calves. ttp to right paraspinous muscles along lower thoracic spine, pain reproduced when rotating back to left  Neurological: She is alert.  Skin: Skin is warm and dry. Capillary refill takes less than 2 seconds.  Psychiatric: She has a normal mood and affect.  Nursing note and vitals reviewed.    ED Treatments / Results  Labs (all  labs ordered are listed, but only abnormal results are displayed) Labs Reviewed  D-DIMER, QUANTITATIVE (NOT AT [facility]) - Abnormal; Notable for the following components:      Result Value   D-Dimer, Quant 0.53 (*)    All other components within normal limits  BASIC METABOLIC PANEL  CBC  BRAIN NATRIURETIC PEPTIDE  I-STAT TROPONIN, ED  I-STAT TROPONIN, ED    EKG  EKG Interpretation  Date/Time:  Monday April 22 2017 17:40:36 EDT Ventricular Rate:  104 PR Interval:  154 QRS Duration: 88 QT Interval:  354 QTC Calculation: 465 R Axis:   41 Text Interpretation:  Sinus tachycardia Nonspecific ST abnormality Abnormal ECG since last tracing no significant change Confirmed by Mancel Bale [phone]) on 04/22/2017 6:54:53 PM       Radiology Dg Chest 2 View  Result Date: 04/22/2017 CLINICAL DATA:  Chest pain EXAM: CHEST - 2 VIEW COMPARISON:  09/03/2016 FINDINGS: Borderline cardiomegaly. No consolidation or effusion. No pneumothorax. Degenerative changes of the spine. IMPRESSION: No active cardiopulmonary disease.  Borderline cardiomegaly. Electronically Signed   By: Jasmine Pang M.D.   On: 04/22/2017 18:15    Procedures Procedures (including critical care time)  Medications Ordered in ED Medications  iopamidol (ISOVUE-370) 76 % injection (not administered)  aspirin EC tablet 325 mg (325 mg Oral Given 04/22/17 2210)     Initial Impression / Assessment and Plan / ED Course  I have reviewed the triage vital signs and the nursing notes.  Pertinent labs & imaging results that were available during my care of the patient were reviewed by me and considered in my medical decision making (see chart for details).    Discussed pt presentation and exam findings with Dr. Effie Shy, who agrees with current workup.  Final Clinical Impressions(s) / ED Diagnoses   Final diagnoses:  None   Is 62 year old female with history of hypertension, hyperlipidemia, Graves' disease, obesity presenting to the  ED with chest pain and shortness of breath for the last 2 days.  Vital signs are stable with mild hypertension.  Doubt hypertensive emergency.  Cardiopulmonary exam within normal limits.  Does have bilateral lower extremity edema which appears symmetric.  No calf tenderness bilaterally. CBC and BMP are within normal limits.    Chest x-ray with borderline cardiomegaly but no active cardio pulmonary disease.  Could represent a component of heart failure given LE swelling. Will order BNP to rule out.  D-dimer also ordered as patient is tachycardic.  ECG showed sinus tachycardia to 102 with nonspecific ST wave abnormality.  Delta troponin negative x2. HEAR score 5. HEART score 5.   Patient care will be signed out to Sharilyn Sites to follow up on results of labwork, and make disposition decision based on results.  ED Discharge Orders    None       Rayne Du 04/22/17 2350    Mancel Bale, MD 04/24/17 [phone]

## 2017-04-22 NOTE — ED Notes (Signed)
Patient transported to CT 

## 2017-04-22 NOTE — ED Notes (Signed)
Pt decided to go to the ER.

## 2017-04-22 NOTE — ED Notes (Signed)
Recollected dark green top for istat trop

## 2017-04-22 NOTE — ED Triage Notes (Signed)
Pt endorses generalized chest pain x 2 days with radiation to both shoulders with shob. Had 1 episode of nausea. Denies dizziness.

## 2017-04-23 LAB — I-STAT TROPONIN, ED: Troponin i, poc: 0 ng/mL (ref 0.00–0.08)

## 2017-04-23 NOTE — ED Provider Notes (Signed)
Assumed care from PA couture at shift change.  See prior notes for full H&P.  Briefly 62 y.o. F here with intermittent chest pain x2 days.  Also reports some SOB, LE edema which is chronic.   Plan:  D-dimer and delta trop pending.  Follow-up on results.  D-dimer +.  CTA ordered.  Delta trop negative.  Results for orders placed or performed during the hospital encounter of 04/22/17  Basic metabolic panel  Result Value Ref Range   Sodium 138 135 - 145 mmol/L   Potassium 3.9 3.5 - 5.1 mmol/L   Chloride 103 101 - 111 mmol/L   CO2 22 22 - 32 mmol/L   Glucose, Bld 99 65 - 99 mg/dL   BUN 12 6 - 20 mg/dL   Creatinine, Ser 4.09 0.44 - 1.00 mg/dL   Calcium 8.9 8.9 - 81.1 mg/dL   GFR calc non Af Amer >60 >60 mL/min   GFR calc Af Amer >60 >60 mL/min   Anion gap 13 5 - 15  CBC  Result Value Ref Range   WBC 8.8 4.0 - 10.5 K/uL   RBC 4.32 3.87 - 5.11 MIL/uL   Hemoglobin 12.6 12.0 - 15.0 g/dL   HCT 91.4 78.2 - 95.6 %   MCV 87.5 78.0 - 100.0 fL   MCH 29.2 26.0 - 34.0 pg   MCHC 33.3 30.0 - 36.0 g/dL   RDW 21.3 08.6 - 57.8 %   Platelets 278 150 - 400 K/uL  Brain natriuretic peptide  Result Value Ref Range   B Natriuretic Peptide 45.1 0.0 - 100.0 pg/mL  D-dimer, quantitative (not at [facility])  Result Value Ref Range   D-Dimer, Quant 0.53 (H) 0.00 - 0.50 ug/mL-FEU  I-stat troponin, ED  Result Value Ref Range   Troponin i, poc 0.00 0.00 - 0.08 ng/mL   Comment 3          I-Stat Troponin, ED (not at [facility])  Result Value Ref Range   Troponin i, poc 0.00 0.00 - 0.08 ng/mL   Comment 3           Dg Chest 2 View  Result Date: 04/22/2017 CLINICAL DATA:  Chest pain EXAM: CHEST - 2 VIEW COMPARISON:  09/03/2016 FINDINGS: Borderline cardiomegaly. No consolidation or effusion. No pneumothorax. Degenerative changes of the spine. IMPRESSION: No active cardiopulmonary disease.  Borderline cardiomegaly. Electronically Signed   By: Jasmine Pang M.D.   On: 04/22/2017 18:15   Ct Angio Chest Pe W And/or Wo  Contrast  Result Date: 04/23/2017 CLINICAL DATA:  Chest pain and shortness of breath for 2 days. EXAM: CT ANGIOGRAPHY CHEST WITH CONTRAST TECHNIQUE: Multidetector CT imaging of the chest was performed using the standard protocol during bolus administration of intravenous contrast. Multiplanar CT image reconstructions and MIPs were obtained to evaluate the vascular anatomy. CONTRAST:  65 mL ISOVUE-370 IOPAMIDOL (ISOVUE-370) INJECTION 76% COMPARISON:  Chest radiographs yesterday.  Chest CT 06/27/2010 FINDINGS: Cardiovascular: There are no filling defects within the pulmonary arteries to the segmental level to suggest pulmonary embolus. Subsegmental branches cannot be assessed due to soft tissue attenuation from body habitus and contrast bolus timing. There are coronary artery calcifications. Minimal aortic arch atherosclerosis without dissection or aneurysm. Minimal pericardial fluid in the superior pericardial recess. Heart borderline enlarged. Mediastinum/Nodes: No enlarged mediastinal or hilar lymph nodes. No axillary adenopathy. The esophagus is decompressed. Thyroid gland is not well visualized. Lungs/Pleura: Trace linear atelectasis in the lingula. No consolidation, pulmonary edema or pleural effusion. No pulmonary mass. Upper  Abdomen: No acute finding. Spleen appears prominent size where visualized. Musculoskeletal: There are no acute or suspicious osseous abnormalities. Review of the MIP images confirms the above findings. IMPRESSION: 1. No pulmonary embolus or acute intrathoracic abnormality. 2. Mild coronary artery calcifications. Trace Aortic Atherosclerosis (ICD10-I70.0). Electronically Signed   By: Rubye OaksMelanie  Ehinger M.D.   On: 04/23/2017 00:36    12:52 AM Patient delta trop and CTA of chest are both negative.  On repeat assessment, patient is resting comfortably, her VS are stale.  I talked with patient about her symptoms over the past 2 days-- she reports when pain comes, she has noticed this more  when up and moving, however has been occurring at rest as well, including some episodes prior to me entering room.  States pain feels like a "tingle" across her chest, occasionally traveling to the left arm.  States this lasts for a few seconds at a time mostly, never more than 30 seconds.  States she has had some intermittent shortness of breath, but states she has this from time to time so is not really sure if it is related to her pain.  She states rest does not seem to make the tingling sensations go away.  Reports she had similar chest episodes like this a few years ago and was seen in the ED without any acute findings.  She was referred to cardiology and they did a stress test that was normal and she was told she did not need to follow-up any further.  She has not seen cardiology since. In regards to her lower extremity edema, this has been ongoing for several years.  She has had multiple venous duplexes of her legs without evidence of DVT. Patient's symptoms are somewhat atypical in that they are very brief, tingling sensation rather than true "pain", lasting only a few seconds, occurring with exertion and at rest, not relieved by rest alone.  She has had a benign workup here including EKG, full labs w/troponin x2, CTA of chest and all are reassuring.  She has a moderate HEART score of 4, however I do feel she is stable for discharge home given factors previously stated.  She has scheduled follow-up with her PCP within the next week.  Will refer back to cardiology as well, may benefit from repeat stress test. She will monitor her symptoms closely at home, return here for any new/acute changes.  Patient is comfortable with plan to d/c home.   Garlon HatchetSanders, Maurilio Puryear M, PA-C 04/23/17 0111    Mancel BaleWentz, Elliott, MD 04/24/17 [phone]

## 2017-04-23 NOTE — Discharge Instructions (Signed)
Follow-up with your doctor as scheduled.  He can review your labs-- stapled on back. I would also recommend to follow-up with cardiology, you may benefit from repeat stress test.  Call in the morning to set this up.  I have given you contact information for office. Return to the ED for new or worsening symptoms.

## 2017-05-08 ENCOUNTER — Other Ambulatory Visit: Payer: Self-pay | Admitting: Family Medicine

## 2017-05-08 DIAGNOSIS — Z1231 Encounter for screening mammogram for malignant neoplasm of breast: Secondary | ICD-10-CM
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
## 2017-05-27 ENCOUNTER — Encounter: Payer: Self-pay | Admitting: *Deleted

## 2017-05-27 ENCOUNTER — Encounter: Payer: Self-pay | Admitting: Cardiology

## 2017-05-27 ENCOUNTER — Ambulatory Visit (INDEPENDENT_AMBULATORY_CARE_PROVIDER_SITE_OTHER): Payer: BLUE CROSS/BLUE SHIELD | Admitting: Cardiology

## 2017-05-27 ENCOUNTER — Encounter (INDEPENDENT_AMBULATORY_CARE_PROVIDER_SITE_OTHER): Payer: Self-pay

## 2017-05-27 VITALS — BP 150/94 | HR 83 | Ht 67.0 in | Wt 282.8 lb

## 2017-05-27 DIAGNOSIS — I313 Pericardial effusion (noninflammatory): Secondary | ICD-10-CM

## 2017-05-27 DIAGNOSIS — R079 Chest pain, unspecified: Secondary | ICD-10-CM | POA: Diagnosis not present

## 2017-05-27 DIAGNOSIS — I3139 Other pericardial effusion (noninflammatory): Secondary | ICD-10-CM

## 2017-05-27 NOTE — Patient Instructions (Addendum)
Medication Instructions:  Your physician recommends that you continue on your current medications as directed. Please refer to the Current Medication list given to you today.   Labwork: None ordered  Testing/Procedures: Your physician has requested that you have a lexiscan myoview. For further information please visit [email or web address]. Please follow instruction sheet, as given.   Your physician has requested that you have an echocardiogram. Echocardiography is a painless test that uses sound waves to create images of your heart. It provides your doctor with information about the size and shape of your heart and how well your heart's chambers and valves are working. This procedure takes approximately one hour. There are no restrictions for this procedure.    Follow-Up: Your physician recommends that you schedule a follow-up appointment in: 1 MONTH WITH DR. Eden EmmsNISHAN  Any Other Special Instructions Will Be Listed Below (If Applicable). Echocardiogram An echocardiogram, or echocardiography, uses sound waves (ultrasound) to produce an image of your heart. The echocardiogram is simple, painless, obtained within a short period of time, and offers valuable information to your health care provider. The images from an echocardiogram can provide information such as:  Evidence of coronary artery disease (CAD).  Heart size.  Heart muscle function.  Heart valve function.  Aneurysm detection.  Evidence of a past heart attack.  Fluid buildup around the heart.  Heart muscle thickening.  Assess heart valve function.  Tell a health care provider about:  Any allergies you have.  All medicines you are taking, including vitamins, herbs, eye drops, creams, and over-the-counter medicines.  Any problems you or family members have had with anesthetic medicines.  Any blood disorders you have.  Any surgeries you have had.  Any medical conditions you have.  Whether you are pregnant or may  be pregnant. What happens before the procedure? No special preparation is needed. Eat and drink normally. What happens during the procedure?  In order to produce an image of your heart, gel will be applied to your chest and a wand-like tool (transducer) will be moved over your chest. The gel will help transmit the sound waves from the transducer. The sound waves will harmlessly bounce off your heart to allow the heart images to be captured in real-time motion. These images will then be recorded.  You may need an IV to receive a medicine that improves the quality of the pictures. What happens after the procedure? You may return to your normal schedule including diet, activities, and medicines, unless your health care provider tells you otherwise. This information is not intended to replace advice given to you by your health care provider. Make sure you discuss any questions you have with your health care provider. Document Released: 01/20/2000 Document Revised: 09/10/2015 Document Reviewed: 09/29/2012 Elsevier Interactive Patient Education  2017 [employer]     If you need a refill on your cardiac medications before your next appointment, please call your pharmacy.

## 2017-05-29 ENCOUNTER — Telehealth (HOSPITAL_COMMUNITY): Payer: Self-pay | Admitting: *Deleted

## 2017-05-29 NOTE — Telephone Encounter (Signed)
Left message on voicemail per DPR in reference to upcoming appointment scheduled on 06/03/17 with detailed instructions given per Myocardial Perfusion Study Information Sheet for the test. LM to arrive 15 minutes early, and that it is imperative to arrive on time for appointment to keep from having the test rescheduled. If you need to cancel or reschedule your appointment, please call the office within 24 hours of your appointment. Failure to do so may result in a cancellation of your appointment, and a $50 no show fee. Phone number given for call back for any questions. Ricky AlaSmith, Nanami Whitelaw [name]

## 2017-05-30 ENCOUNTER — Telehealth: Payer: Self-pay | Admitting: Cardiology

## 2017-05-30 ENCOUNTER — Telehealth (HOSPITAL_COMMUNITY): Payer: Self-pay | Admitting: *Deleted

## 2017-05-30 NOTE — Telephone Encounter (Signed)
New message   Patient calling to request medication for anxiety concerning getting myocardial perfusion. Pharmacy preference :[id number] - [place], [place] - [address]

## 2017-05-30 NOTE — Telephone Encounter (Signed)
Returned pts call.  Left a message for pt to call back  

## 2017-05-30 NOTE — Telephone Encounter (Signed)
Returning patient's call- no answer, left message for patient to call back.  Cheryl [name], [name] [name]

## 2017-05-31 NOTE — Telephone Encounter (Signed)
Pt returning call to nurse concerning if she can have some medications for anxiety.

## 2017-06-03 ENCOUNTER — Other Ambulatory Visit: Payer: Self-pay

## 2017-06-03 ENCOUNTER — Ambulatory Visit (HOSPITAL_BASED_OUTPATIENT_CLINIC_OR_DEPARTMENT_OTHER): Payer: BLUE CROSS/BLUE SHIELD

## 2017-06-03 ENCOUNTER — Encounter (INDEPENDENT_AMBULATORY_CARE_PROVIDER_SITE_OTHER): Payer: Self-pay

## 2017-06-03 ENCOUNTER — Ambulatory Visit (HOSPITAL_COMMUNITY): Payer: BLUE CROSS/BLUE SHIELD | Attending: Cardiovascular Disease

## 2017-06-03 DIAGNOSIS — I119 Hypertensive heart disease without heart failure: Secondary | ICD-10-CM | POA: Diagnosis not present

## 2017-06-03 DIAGNOSIS — Z6841 Body Mass Index (BMI) 40.0 and over, adult: Secondary | ICD-10-CM | POA: Diagnosis not present

## 2017-06-03 DIAGNOSIS — I313 Pericardial effusion (noninflammatory): Secondary | ICD-10-CM | POA: Diagnosis not present

## 2017-06-03 DIAGNOSIS — R5383 Other fatigue: Secondary | ICD-10-CM | POA: Diagnosis not present

## 2017-06-03 DIAGNOSIS — R0609 Other forms of dyspnea: Secondary | ICD-10-CM | POA: Diagnosis not present

## 2017-06-03 DIAGNOSIS — I2510 Atherosclerotic heart disease of native coronary artery without angina pectoris: Secondary | ICD-10-CM | POA: Diagnosis not present

## 2017-06-03 DIAGNOSIS — R079 Chest pain, unspecified: Secondary | ICD-10-CM | POA: Diagnosis not present

## 2017-06-03 DIAGNOSIS — I059 Rheumatic mitral valve disease, unspecified: Secondary | ICD-10-CM | POA: Diagnosis not present

## 2017-06-03 DIAGNOSIS — E669 Obesity, unspecified: Secondary | ICD-10-CM | POA: Insufficient documentation

## 2017-06-03 DIAGNOSIS — I3139 Other pericardial effusion (noninflammatory): Secondary | ICD-10-CM

## 2017-06-03 LAB — ECHOCARDIOGRAM COMPLETE
Height: 67 in
Weight: 4512 oz

## 2017-06-03 MED ORDER — TECHNETIUM TC 99M TETROFOSMIN IV KIT
32.7000 | PACK | Freq: Once | INTRAVENOUS | Status: AC | PRN
  Administered 2017-06-03: 32.7 via INTRAVENOUS
  Filled 2017-06-03: qty 33

## 2017-06-03 MED ORDER — REGADENOSON 0.4 MG/5ML IV SOLN
0.4000 mg | Freq: Once | INTRAVENOUS | Status: AC
  Administered 2017-06-03: 0.4 mg via INTRAVENOUS

## 2017-06-04 ENCOUNTER — Ambulatory Visit (HOSPITAL_COMMUNITY): Payer: BLUE CROSS/BLUE SHIELD | Attending: Cardiology

## 2017-06-04 LAB — MYOCARDIAL PERFUSION IMAGING
CHL CUP NUCLEAR SRS: 1
CSEPPHR: 92 {beats}/min
LHR: 0.36
LVDIAVOL: 119 mL (ref 46–106)
LVSYSVOL: 49 mL
Rest HR: 81 {beats}/min
SDS: 0
SSS: 1
TID: 0.97

## 2017-06-04 MED ORDER — TECHNETIUM TC 99M TETROFOSMIN IV KIT
30.9000 | PACK | Freq: Once | INTRAVENOUS | Status: AC | PRN
  Administered 2017-06-04: 30.9 via INTRAVENOUS
  Filled 2017-06-04: qty 31

## 2017-06-05 ENCOUNTER — Telehealth: Payer: Self-pay

## 2017-06-05 NOTE — Telephone Encounter (Signed)
-----   Message from Leone Brand, NP sent at 06/04/2017  4:34 PM EDT ----- Good news, no lack of blood supply to heart.  Keep appt with Dr. Eden Emms

## 2017-06-05 NOTE — Telephone Encounter (Signed)
lmtcb for stress test results 

## 2017-06-07 ENCOUNTER — Telehealth: Payer: Self-pay | Admitting: Cardiology

## 2017-06-07 NOTE — Telephone Encounter (Signed)
New Message ° ° °Pt returning call for results. Please call °

## 2017-06-07 NOTE — Telephone Encounter (Signed)
Notes recorded by Leone Brand, NP on 06/04/2017 at 4:34 PM EDT Good news, no lack of blood supply to heart. Keep appt with Dr. Eden Emms.   Endorsed to the pt her stress test results per Nada Boozer NP.  Pt verbalized understanding.

## 2017-06-14 ENCOUNTER — Ambulatory Visit
Admission: RE | Admit: 2017-06-14 | Discharge: 2017-06-14 | Disposition: A | Discharge status: Home / Self Care | Payer: BLUE CROSS/BLUE SHIELD | Source: Ambulatory Visit | Attending: Family Medicine | Admitting: Family Medicine

## 2017-06-14 ENCOUNTER — Ambulatory Visit: Payer: BLUE CROSS/BLUE SHIELD

## 2017-06-14 DIAGNOSIS — Z1231 Encounter for screening mammogram for malignant neoplasm of breast: Secondary | ICD-10-CM

## 2017-06-17 ENCOUNTER — Telehealth: Payer: Self-pay | Admitting: Cardiology

## 2017-06-17 NOTE — Telephone Encounter (Signed)
New message   Patient returning call to discuss test results

## 2017-06-18 NOTE — Telephone Encounter (Signed)
Returned pt's call re: Echo results. Left a message for her to call back.
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
## 2017-07-03 ENCOUNTER — Ambulatory Visit (INDEPENDENT_AMBULATORY_CARE_PROVIDER_SITE_OTHER): Payer: BLUE CROSS/BLUE SHIELD | Admitting: Cardiovascular Disease

## 2017-07-03 ENCOUNTER — Encounter: Payer: Self-pay | Admitting: Cardiovascular Disease

## 2017-07-03 VITALS — BP 130/88 | HR 94 | Ht 67.0 in | Wt 281.0 lb

## 2017-07-03 DIAGNOSIS — R079 Chest pain, unspecified: Secondary | ICD-10-CM | POA: Diagnosis not present

## 2017-07-03 NOTE — Patient Instructions (Addendum)
Medication Instructions:  Your physician recommends that you continue on your current medications as directed. Please refer to the Current Medication list given to you today.  Labwork: NONE  Testing/Procedures: NONE  Follow-Up: Your physician wants you to follow-up as needed with  Dr. Nishan.    If you need a refill on your cardiac medications before your next appointment, please call your pharmacy.    

## 2017-08-27 ENCOUNTER — Other Ambulatory Visit: Payer: Self-pay

## 2017-08-27 ENCOUNTER — Ambulatory Visit (INDEPENDENT_AMBULATORY_CARE_PROVIDER_SITE_OTHER): Payer: BLUE CROSS/BLUE SHIELD | Admitting: Podiatry

## 2017-08-27 ENCOUNTER — Ambulatory Visit (INDEPENDENT_AMBULATORY_CARE_PROVIDER_SITE_OTHER): Payer: BLUE CROSS/BLUE SHIELD

## 2017-08-27 ENCOUNTER — Encounter: Payer: Self-pay | Admitting: Podiatry

## 2017-08-27 DIAGNOSIS — M7751 Other enthesopathy of right foot: Secondary | ICD-10-CM

## 2017-08-27 DIAGNOSIS — M79672 Pain in left foot: Secondary | ICD-10-CM | POA: Diagnosis not present

## 2017-08-27 DIAGNOSIS — G8929 Other chronic pain: Secondary | ICD-10-CM

## 2017-08-27 DIAGNOSIS — M7752 Other enthesopathy of left foot: Secondary | ICD-10-CM

## 2017-08-27 DIAGNOSIS — M779 Enthesopathy, unspecified: Secondary | ICD-10-CM

## 2017-08-27 DIAGNOSIS — M19072 Primary osteoarthritis, left ankle and foot: Secondary | ICD-10-CM

## 2017-08-27 DIAGNOSIS — M216X2 Other acquired deformities of left foot: Secondary | ICD-10-CM | POA: Diagnosis not present

## 2017-08-28 NOTE — Progress Notes (Addendum)
Subjective:   Patient ID: Cheryl [name], female   DOB: 62 y.o.   MRN: [id number]   HPI 62 year old female presents the office today for concerns of ongoing bilateral foot pain which is been chronic for over a year.  She states that she has seen orthopedics and she was told that she has tendinitis.  She is also been seeing orthopedics for her knee and back as well and she recently just going to physical therapy for all the issues.  She states that she gets pain on the left ankle she points the lateral aspect going toward the top of her foot.  She denies any recent injury but she does have a history of prior injury where she states that she broke her midfoot and heel couple years ago.  She did well for some time until about a year ago she started to have recurrence of pain.  She notes some occasional swelling but again this is been a chronic issue for her not new she denies any redness or open sores.   Review of Systems  All other systems reviewed and are negative.  Past Medical History:  Diagnosis Date  . Anemia   . Chest pain   . Chronic dermatitis   . Edema 06/15/2010  . Edema of both legs   . Grave's disease   . HLD (hyperlipidemia)   . HTN (hypertension)   . Leg edema 09/02/2012  . Obesity   . Pericarditis 10/05/2010  . Thyroid disease 06/15/2010  . Varicose veins   . 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 Varicose veins of lower extremities with other complications 09/02/2012    Past Surgical History:  Procedure Laterality Date  . CHOLECYSTECTOMY    . ENDOVENOUS ABLATION SAPHENOUS VEIN W/ LASER    . SKIN GRAFT       Current Outpatient Medications:  .  acetaminophen (TYLENOL) 500 MG tablet, Take 500 mg by mouth every 6 (six) hours as needed.  , Disp: , Rfl:  .  cyclobenzaprine (FLEXERIL) 5 MG tablet, cyclobenzaprine 5 mg tablet, Disp: , Rfl:  .  ibuprofen (ADVIL,MOTRIN) 200 MG tablet, Take 200 mg by mouth every 6 (six) hours as needed. Reported on 06/05/2015, Disp:  , Rfl:  .  levothyroxine (SYNTHROID, LEVOTHROID) 175 MCG tablet, Take 175 mcg by mouth daily. , Disp: , Rfl: 5 .  naproxen (NAPROSYN) 500 MG tablet, naproxen 500 mg tablet, Disp: , Rfl:  .  phentermine (ADIPEX-P) 37.5 MG tablet, Take 37.5 mg by mouth daily., Disp: , Rfl: 2  Allergies  Allergen Reactions  . Antihistamines, Chlorpheniramine-Type   . Simvastatin Other (See Comments)    Made her feel bad  . Terbinafine Other (See Comments)    Made her skin gel-up          Objective:  Physical Exam  General: AAO x3, NAD  Dermatological: Skin is warm, dry and supple bilateral. Nails x 10 are well manicured; remaining integument appears unremarkable at this time. There are no open sores, no preulcerative lesions, no rash or signs of infection present.  Vascular: Dorsalis Pedis artery and Posterior Tibial artery pedal pulses are 2/4 bilateral with immedate capillary fill time.There is no pain with calf compression, swelling, warmth, erythema.  Varicosities are present.  Neruologic: Grossly intact via light touch bilateral. Protective threshold with Semmes Wienstein monofilament intact to all pedal sites bilateral.   Musculoskeletal: She does not have to fully extend her knee.  She does appear to have tight hamstrings,  gastroc and equinus is present.  There is tenderness along the peroneal tendon.  The pain does not appear to be intact.  Minimal discomfort the Achilles tendon this appears to be intact.  There is no area pinpoint tenderness identified at this time.  Mild pitting edema mostly to the medial ankle but there is no pain in this area.  Muscular strength 5/5 in all groups tested bilateral.  Gait: Upon walking she cannot fully extend her knee.     Assessment:   Both chronic foot and ankle pain likely tendinitis, arthritis     Plan:  -Treatment options discussed including all alternatives, risks, and complications -Etiology of symptoms were discussed -X-rays were obtained and  reviewed with the patient.  No definitive signs of acute fractures identified today.  Severe heel spurs are present.  Arthritic changes are present. -At this point we discussed treatments from multiple levels.  Will start with meloxicam to help with swelling and pain.  I do think we need to start some physical therapy to work on the equinus and continue to stretch the areas.  She was told previously she is to have a knee replacement I think a lot of her foot issues are coming from her knee not being able to fully extend.  Still working on physical therapy dedicated to the foot and ankle and prescription was written for [facility].  We will also check orthotic coverage significant help support her foot better.  He did not do a custom one will try for better over-the-counter inserts.  Vivi Barrack DPM     Addendum: sent Mobic to the pharmacy.

## 2017-08-29 ENCOUNTER — Telehealth: Payer: Self-pay | Admitting: Podiatry

## 2017-08-29 MED ORDER — MELOXICAM 15 MG PO TABS: 15.0000 mg | ORAL_TABLET | Freq: Every day | ORAL | 0 refills | Status: DC

## 2017-08-29 NOTE — Addendum Note (Signed)
Addended by: Ovid CurdWAGONER, MATTHEW R on: 08/29/2017 04:57 PM   Modules accepted: Orders

## 2017-08-29 NOTE — Telephone Encounter (Signed)
done

## 2017-08-29 NOTE — Telephone Encounter (Signed)
I saw Dr. Ardelle AntonWagoner on 23 July and he said was going to call in a prescription for [facility] [address]. They have not heard from him so if you could please take care of that and call me back. Thank you.

## 2017-08-30 NOTE — Telephone Encounter (Signed)
Thank you :)

## 2017-09-04 ENCOUNTER — Telehealth: Payer: Self-pay | Admitting: Podiatry

## 2017-09-04 NOTE — Telephone Encounter (Signed)
Left message for pt to call to discuss orthotic benefits. °

## 2017-09-27 ENCOUNTER — Telehealth: Payer: Self-pay | Admitting: *Deleted

## 2017-09-27 MED ORDER — MELOXICAM 15 MG PO TABS: 15.0000 mg | ORAL_TABLET | Freq: Every day | ORAL | 0 refills | Status: DC

## 2017-09-27 NOTE — Telephone Encounter (Signed)
Refill request for Meloxicam. Dr. Ardelle AntonWagoner states refill once and if pt continues to have pain needs an appt prior to refills.

## 2017-10-08 ENCOUNTER — Ambulatory Visit (INDEPENDENT_AMBULATORY_CARE_PROVIDER_SITE_OTHER): Payer: BLUE CROSS/BLUE SHIELD | Admitting: Podiatry

## 2017-10-08 DIAGNOSIS — M216X2 Other acquired deformities of left foot: Secondary | ICD-10-CM

## 2017-10-08 DIAGNOSIS — M779 Enthesopathy, unspecified: Secondary | ICD-10-CM | POA: Diagnosis not present

## 2017-10-08 DIAGNOSIS — M7752 Other enthesopathy of left foot: Secondary | ICD-10-CM

## 2017-10-08 MED ORDER — MELOXICAM 15 MG PO TABS: 15.0000 mg | ORAL_TABLET | Freq: Every day | ORAL | 0 refills | Status: DC

## 2017-10-12 NOTE — Progress Notes (Signed)
Subjective: 62 year old female presents the office today for follow-up evaluation of bilateral foot pain.  She is been going to physical therapy and she said this is been helping quite a bit she is also been using meloxicam.  She states that she got better range of motion her overall her pain is improved since starting therapy.  She denies any recent injury or falls and denies any other changes.  She has no other concerns. Denies any systemic complaints such as fevers, chills, nausea, vomiting. No acute changes since last appointment, and no other complaints at this time.   Objective: AAO x3, NAD DP/PT pulses palpable bilaterally, CRT less than 3 seconds Overall the range of motion to her ankle is improved compared to what it has been.  She also feels that her range of motion to her knees got somewhat better.  There is decreased tenderness palpation of the foot I am unable to elicit any area of tenderness.  There is no edema, erythema.  Specifically there is no pain on the peroneal tendon or the Achilles tendon.  With her to be intact.  Plantar fascia intact.  No area pinpoint tenderness.  No open lesions or pre-ulcerative lesions.  No pain with calf compression, swelling, warmth, erythema  Assessment: Resolving bilateral foot pain with increased range of motion  Plan: -All treatment options discussed with the patient including all alternatives, risks, complications.  -She is improving with physical therapy wearing continue with this.  I also refilled meloxicam to use as needed.  Continue ice to the area at home.  Continue with supportive shoes.  I will see her back in about 2 months or sooner if any issues are to arise or any worsening the meantime let me know. -Patient encouraged to call the office with any questions, concerns, change in symptoms.   Vivi Barrack DPM

## 2017-10-14 ENCOUNTER — Telehealth: Payer: Self-pay | Admitting: Podiatry

## 2017-10-14 NOTE — Telephone Encounter (Signed)
Pt returned my call  And left message regarding custom orthotics and was not aware I was to check benefits for them. She was confused about it.But asked me to call back and ok to leave a message.  I returned call and had to leave a message explaining that I was asked to check benefits for custom made orthotics and if she is interested she can get an appt with Raiford Noble or discuss at her appt with Dr Ardelle Anton in October and go from there.

## 2017-10-16 NOTE — Telephone Encounter (Signed)
Thanks

## 2017-11-19 ENCOUNTER — Ambulatory Visit (INDEPENDENT_AMBULATORY_CARE_PROVIDER_SITE_OTHER): Payer: BLUE CROSS/BLUE SHIELD | Admitting: Podiatry

## 2017-11-19 DIAGNOSIS — G8929 Other chronic pain: Secondary | ICD-10-CM

## 2017-11-19 DIAGNOSIS — M216X2 Other acquired deformities of left foot: Secondary | ICD-10-CM

## 2017-11-19 DIAGNOSIS — M7751 Other enthesopathy of right foot: Secondary | ICD-10-CM | POA: Diagnosis not present

## 2017-11-19 DIAGNOSIS — M19072 Primary osteoarthritis, left ankle and foot: Secondary | ICD-10-CM

## 2017-11-19 DIAGNOSIS — M7752 Other enthesopathy of left foot: Secondary | ICD-10-CM | POA: Diagnosis not present

## 2017-11-19 DIAGNOSIS — M79672 Pain in left foot: Secondary | ICD-10-CM

## 2017-11-26 NOTE — Progress Notes (Signed)
Subjective: 62 year old female presents the office today for follow-up evaluation of bilateral foot pain.  She has been continuing physical therapy and she feels that she has made tremendous improvement with doing this.  She actually started to tear up when talking about it because she is had such good progress.  She is able to move better as well.  She has also stopped taking meloxicam as frequent.  She denies any recent injury or falls or any swelling.  No other concerns.  Objective: AAO x3, NAD DP/PT pulses palpable bilaterally, CRT less than 3 seconds Overall the range of motion to her ankle and even her knee has been improving.  There is no area pinpoint bony tenderness or pain to vibratory sensation bilaterally there is no significant increase in swelling bilaterally there is no erythema or warmth.  Overall her symptoms are improving.  There is no pain on the course of plantar fascia to the either.  Achilles tendon appears to be intact as well as flexor, extensor tendons. No open lesions or pre-ulcerative lesions.  No pain with calf compression, swelling, warmth, erythema  Assessment: Resolving bilateral foot pain with increased range of motion  Plan: -All treatment options discussed with the patient including all alternatives, risks, complications.  -She is improving with physical therapy wearing continue with this.  She will continue meloxicam as needed but discussed not taking this on a regular basis. -Continue with supportive shoes we also can consider orthotics. -Patient encouraged to call the office with any questions, concerns, change in symptoms.   Vivi Barrack DPM

## 2017-11-28 ENCOUNTER — Other Ambulatory Visit: Payer: Self-pay | Admitting: Podiatry

## 2018-01-06 ENCOUNTER — Other Ambulatory Visit: Payer: Self-pay | Admitting: Podiatry

## 2018-01-20 ENCOUNTER — Encounter: Payer: Self-pay | Admitting: Podiatry

## 2018-01-20 ENCOUNTER — Ambulatory Visit (INDEPENDENT_AMBULATORY_CARE_PROVIDER_SITE_OTHER): Payer: BLUE CROSS/BLUE SHIELD | Admitting: Podiatry

## 2018-01-20 DIAGNOSIS — M7751 Other enthesopathy of right foot: Secondary | ICD-10-CM

## 2018-01-20 DIAGNOSIS — M216X2 Other acquired deformities of left foot: Secondary | ICD-10-CM | POA: Diagnosis not present

## 2018-01-26 ENCOUNTER — Other Ambulatory Visit: Payer: Self-pay | Admitting: Podiatry

## 2018-01-27 NOTE — Progress Notes (Signed)
Subjective: 62 year old female presents the office today for follow-up evaluation of bilateral foot pain.  She states that she is continued to improve and she is making good progress with physical therapy and she is requesting continued physical therapy.  She also has a range of motion to her legs and ankles have been much better.  She is walking better.  She did fall about 2 weeks ago taking out the trash as she missed a step, she denies any injury to time.  No increase in pain.  She has no other concerns.   Objective: AAO x3, NAD DP/PT pulses palpable bilaterally, CRT less than 3 seconds Overall the range of motion to her ankle and lower extremities in general are improved.  There is no area pinpoint bony tenderness or pain to vibratory sensation bilaterally there is no significant increase in swelling bilaterally there is no erythema or warmth.  There is no pain on the course of plantar fascia to the either.  Achilles tendon appears to be intact as well as flexor, extensor tendons.  There is no edema, erythema bilaterally and no area pinpoint tenderness. No open lesions or pre-ulcerative lesions.  No pain with calf compression, swelling, warmth, erythema  Assessment: Resolving bilateral foot pain with increased range of motion  Plan: -All treatment options discussed with the patient including all alternatives, risks, complications.  -Today in great improvement with physical therapy we will continue with this.  Meloxicam as needed. -Continue with supportive shoes and good arch support.  -Patient encouraged to call the office with any questions, concerns, change in symptoms.   Vivi BarrackMatthew R Wagoner DPM

## 2018-02-20 ENCOUNTER — Other Ambulatory Visit: Payer: Self-pay | Admitting: Podiatry

## 2018-03-13 ENCOUNTER — Other Ambulatory Visit: Payer: Self-pay | Admitting: Podiatry

## 2018-05-30 ENCOUNTER — Other Ambulatory Visit: Payer: Self-pay | Admitting: Family Medicine

## 2018-05-30 DIAGNOSIS — Z1231 Encounter for screening mammogram for malignant neoplasm of breast: Secondary | ICD-10-CM

## 2018-07-30 ENCOUNTER — Ambulatory Visit
Admission: RE | Admit: 2018-07-30 | Discharge: 2018-07-30 | Disposition: A | Discharge status: Home / Self Care | Payer: BC Managed Care – PPO | Source: Ambulatory Visit | Attending: Family Medicine | Admitting: Family Medicine

## 2018-07-30 ENCOUNTER — Other Ambulatory Visit: Payer: Self-pay

## 2018-07-30 DIAGNOSIS — Z1231 Encounter for screening mammogram for malignant neoplasm of breast: Secondary | ICD-10-CM

## 2018-09-04 ENCOUNTER — Ambulatory Visit (INDEPENDENT_AMBULATORY_CARE_PROVIDER_SITE_OTHER): Payer: BC Managed Care – PPO | Admitting: Pulmonary Disease

## 2018-09-04 ENCOUNTER — Encounter: Payer: Self-pay | Admitting: Pulmonary Disease

## 2018-09-04 ENCOUNTER — Other Ambulatory Visit: Payer: Self-pay

## 2018-09-04 VITALS — BP 138/80 | HR 77 | Temp 98.2°F | Ht 66.0 in | Wt 279.4 lb

## 2018-09-04 DIAGNOSIS — R29818 Other symptoms and signs involving the nervous system: Secondary | ICD-10-CM

## 2018-09-04 NOTE — Progress Notes (Signed)
Subjective:    Patient ID: [name], female    DOB: 1955-07-28, 63 y.o.   MRN: [id number]  Patient being seen for complaints of tossing and turning during sleep  History of snoring No witnessed apneas  She feels she sleeps well but wakes up a lot at times from discomfort and lower extremity She does have a history of back pain and limb pain--she feels she is not able to stretch out her legs during the night  Denies symptoms suggesting restless legs  Usually goes to bed between 8 and 9 PM Falls asleep in a few minutes Wakes up finally about 3:30 in the morning  Denies any dryness of the mouth in the morning No headaches in the mornings  Never smoker No pets  No pertinent occupational history  Past Medical History:  Diagnosis Date  . Anemia   . Chest pain   . Chronic dermatitis   . Edema 06/15/2010  . Edema of both legs   . Grave's disease   . HLD (hyperlipidemia)   . HTN (hypertension)   . Leg edema 09/02/2012  . Obesity   . Pericarditis 10/05/2010  . Thyroid disease 06/15/2010  . Varicose veins   . 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 Varicose veins of lower extremities with other complications 09/02/2012   Family History  Problem Relation Age of Onset  . Cancer Mother        lung  . Other Mother        varicose veins  . Varicose Veins Mother   . Heart disease Father   . Hyperlipidemia Father   . Hypertension Father   . Other Father        pvd  . Heart attack Father   . Peripheral vascular disease Father        amputation  . Cancer Sister   . Coronary artery disease Other   . Cancer Sister   . Breast cancer Neg Hx     Review of Systems  Constitutional: Negative for fever and unexpected weight change.  HENT: Negative for congestion, dental problem, ear pain, nosebleeds, postnasal drip, rhinorrhea, sinus pressure, sneezing, sore throat and trouble swallowing.   Eyes: Negative for redness and itching.  Respiratory: Negative for  cough, chest tightness, shortness of breath and wheezing.   Cardiovascular: Negative for palpitations and leg swelling.  Gastrointestinal: Negative for nausea and vomiting.  Genitourinary: Negative for dysuria.  Musculoskeletal: Positive for joint swelling.  Skin: Negative for rash.  Allergic/Immunologic: Positive for environmental allergies. Negative for food allergies and immunocompromised state.  Neurological: Negative for headaches.  Psychiatric/Behavioral: Negative for dysphoric mood. The patient is not nervous/anxious.        Objective:   Physical Exam Constitutional:      Appearance: Normal appearance.  HENT:     Head: Normocephalic and atraumatic.     Mouth/Throat:     Comments: Crowded oropharynx, Mallampati 3 Neck:     Musculoskeletal: Normal range of motion. No neck rigidity or muscular tenderness.  Cardiovascular:     Rate and Rhythm: Normal rate and regular rhythm.     Pulses: Normal pulses.     Heart sounds: No murmur.  Pulmonary:     Effort: No respiratory distress.     Breath sounds: Normal breath sounds. No stridor. No wheezing or rhonchi.  Abdominal:     General: There is no distension.     Tenderness: There is no abdominal tenderness.  Musculoskeletal:  General: No swelling or deformity.  Skin:    General: Skin is warm and dry.     Coloration: Skin is not jaundiced or pale.  Neurological:     General: No focal deficit present.     Mental Status: She is alert.  Psychiatric:        Mood and Affect: Mood normal.    BP 138/80 (BP Location: Right Arm, Cuff Size: Large)   Pulse 77   Temp 98.2 F (36.8 C) (Oral)   Ht 5\' 6"  (1.676 m)   Wt 279 lb 6.4 oz (126.7 kg)   SpO2 95%   BMI 45.10 kg/m  Results of the Epworth flowsheet 09/04/2018  Sitting and reading 3  Watching TV 1  Sitting, inactive in a public place (e.g. a theatre or a meeting) 0  As a passenger in a car for an hour without a break 0  Lying down to rest in the afternoon when  circumstances permit 3  Sitting and talking to someone 0  Sitting quietly after a lunch without alcohol 0  In a car, while stopped for a few minutes in traffic 0  Total score 7       Assessment & Plan:  .  Moderate probability of significant sleep disordered breathing .  Nonrestorative sleep .  Restless sleep  Deny significant symptoms suggesting restless leg syndrome She has used phentermine for about a year-may be contributing to movements Uncontrolled pain and discomfort may also be contributing to movements  Plan: .  Obtain a home sleep study .  Encouraged to continue working on weight loss efforts .  Pathophysiology of sleep disordered breathing discussed with the patient .  Options of treatment discussed with the patient  I will see her back in the office in about 8 weeks  Encouraged to call with any significant concerns

## 2018-09-04 NOTE — Patient Instructions (Signed)
Multiple awakenings Restless legs at night Moderate probability of obstructive sleep apnea  We will obtain a home sleep study Encourage you to try melatonin  Make sure you bring the skin lesion around the nasal bridge to the attention of your primary doctor  I will see you back in the office in about 8 weeks Call with any significant concerns

## 2018-09-26 ENCOUNTER — Other Ambulatory Visit: Payer: Self-pay | Admitting: Orthopedic Surgery

## 2018-09-26 DIAGNOSIS — M545 Low back pain, unspecified: Secondary | ICD-10-CM

## 2018-09-26 DIAGNOSIS — M5416 Radiculopathy, lumbar region: Secondary | ICD-10-CM

## 2018-10-17 ENCOUNTER — Other Ambulatory Visit: Payer: Self-pay

## 2018-10-17 ENCOUNTER — Ambulatory Visit: Payer: BC Managed Care – PPO

## 2018-10-17 DIAGNOSIS — R29818 Other symptoms and signs involving the nervous system: Secondary | ICD-10-CM

## 2018-10-17 DIAGNOSIS — G4733 Obstructive sleep apnea (adult) (pediatric): Secondary | ICD-10-CM

## 2018-10-21 ENCOUNTER — Telehealth: Payer: Self-pay

## 2018-10-21 DIAGNOSIS — G4733 Obstructive sleep apnea (adult) (pediatric): Secondary | ICD-10-CM

## 2018-10-21 NOTE — Telephone Encounter (Signed)
Aggressive weight loss measures should be initiated and maintained  Sleep position optimization by encouraging sleep in lateral position, elevating head of the bed by about 30 degrees may help.  CPAP therapy can be considered if patient has significant daytime symptoms that can be ascribed to be in effect, of sleep disordered breathing.  Auto CPAP 5-15 of pressure will be effective starting pressure if chosen treatment.  Regular exercise will help promote good quality sleep  Cautioned against driving sleepy and against medications with sedative effects.

## 2018-10-21 NOTE — Telephone Encounter (Signed)
Advised pt of results. Pt understood and nothing further is needed.   She declined CPAP at this time and will try the bed positioning for now.

## 2018-10-24 ENCOUNTER — Other Ambulatory Visit: Payer: BC Managed Care – PPO

## 2018-10-24 ENCOUNTER — Other Ambulatory Visit: Payer: Self-pay

## 2018-10-24 ENCOUNTER — Ambulatory Visit
Admission: RE | Admit: 2018-10-24 | Discharge: 2018-10-24 | Disposition: A | Discharge status: Home / Self Care | Payer: BC Managed Care – PPO | Source: Ambulatory Visit | Attending: Orthopedic Surgery | Admitting: Orthopedic Surgery

## 2018-10-24 DIAGNOSIS — M545 Low back pain, unspecified: Secondary | ICD-10-CM

## 2018-10-24 DIAGNOSIS — M5416 Radiculopathy, lumbar region: Secondary | ICD-10-CM

## 2019-03-08 IMAGING — MG DIGITAL SCREENING BILATERAL MAMMOGRAM WITH TOMO AND CAD
8 series · 8 of 24 positions shown · non-contrast
Comparison: Previous exam(s).

CLINICAL DATA: Screening.

EXAM:
DIGITAL SCREENING BILATERAL MAMMOGRAM WITH TOMO AND CAD

[R CC synth-2D]
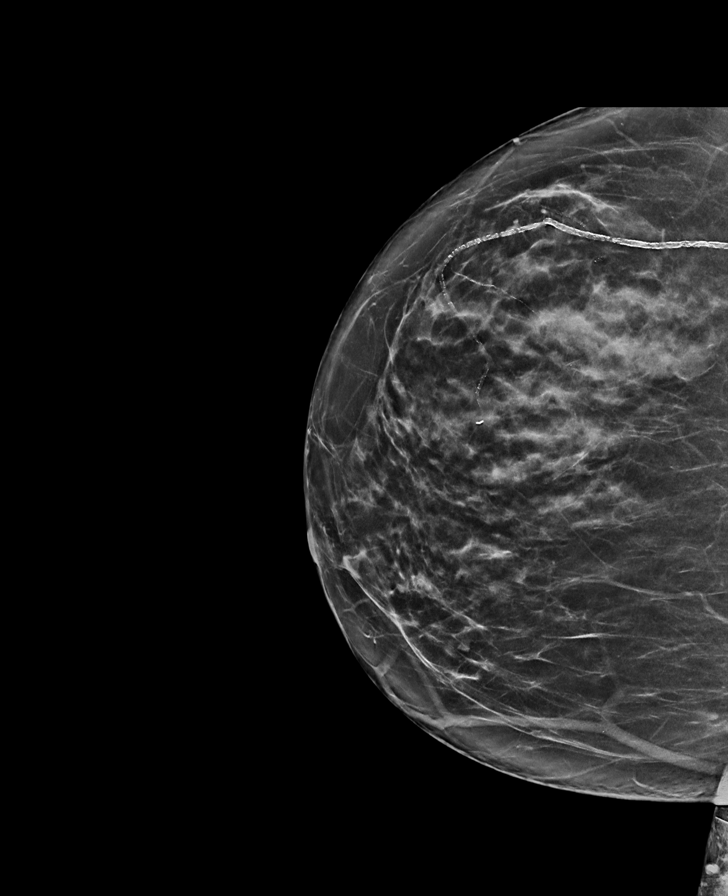

[L MLO synth-2D]
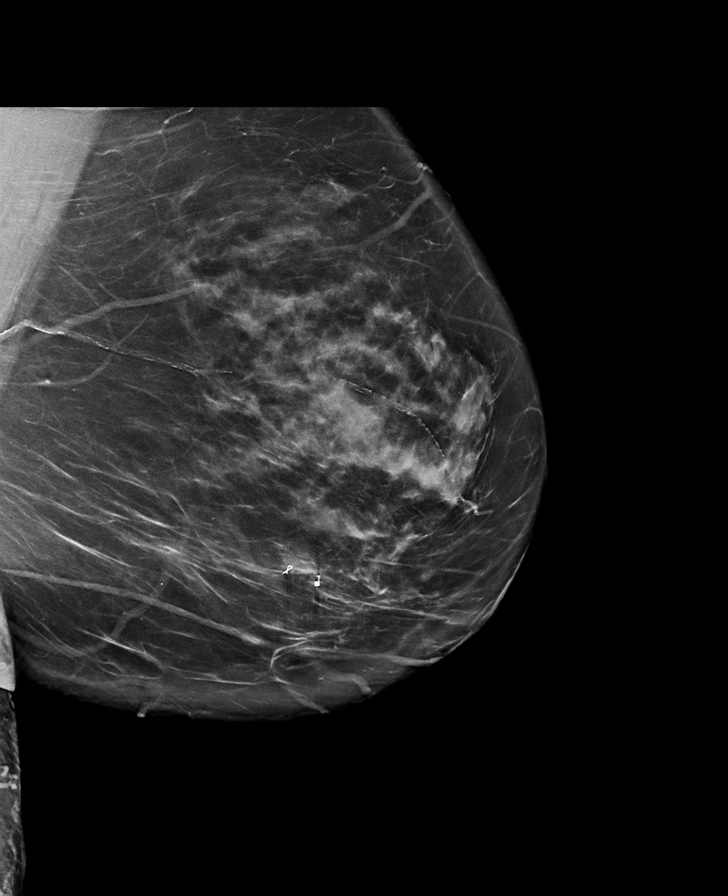

[L CC synth-2D]
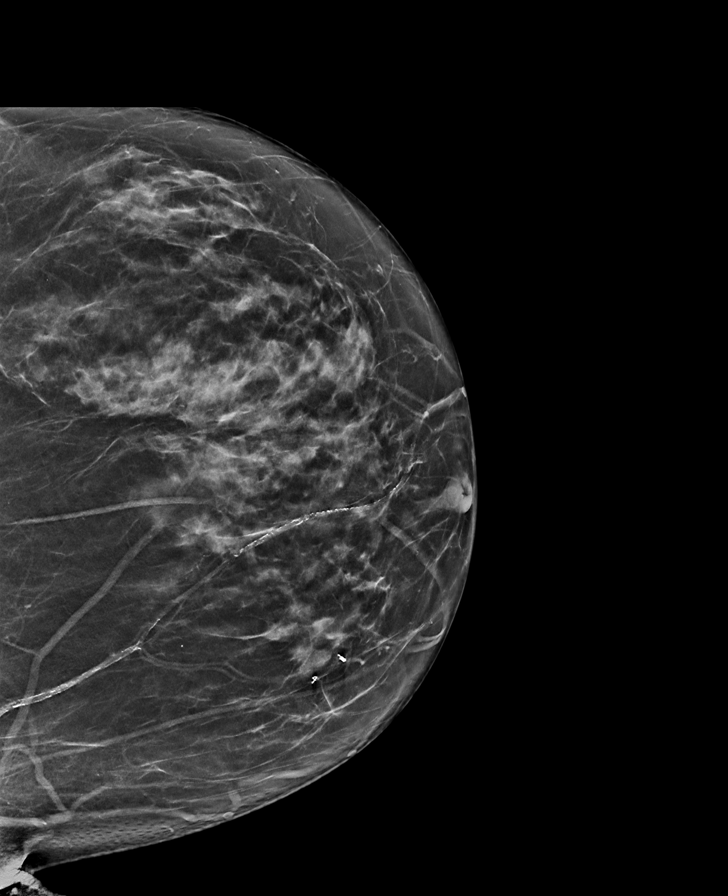

[R MLO synth-2D]
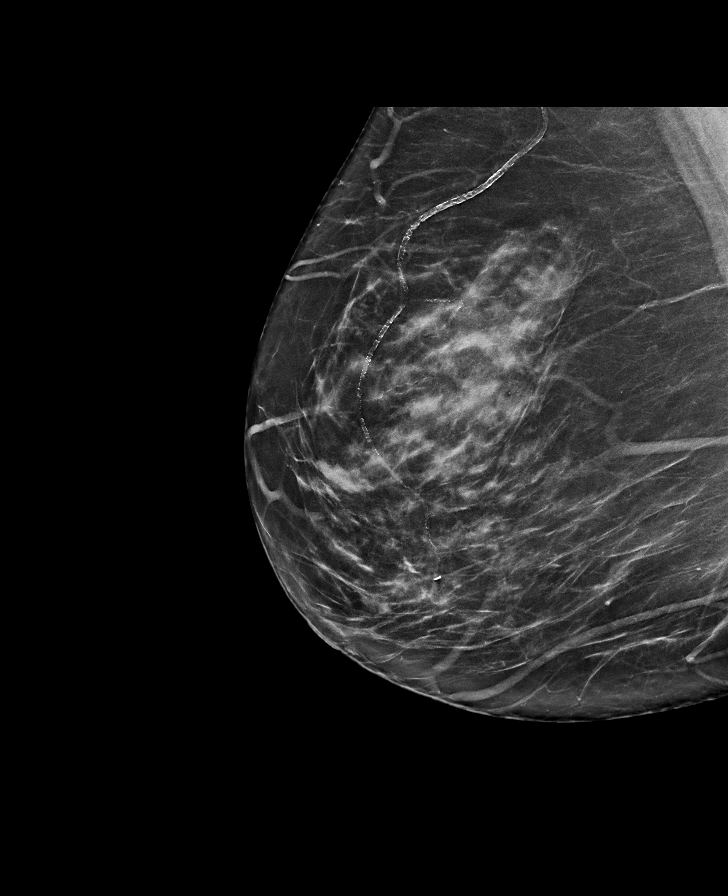

[L MLO tomo · tomo slice 48/95.0]
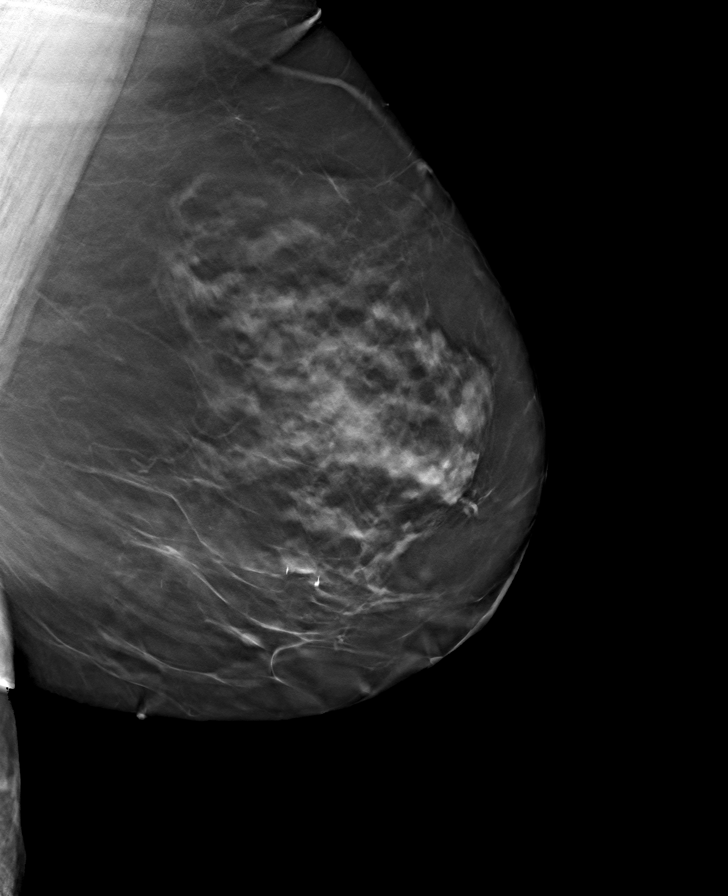

[L CC tomo · tomo slice 39/76.0]
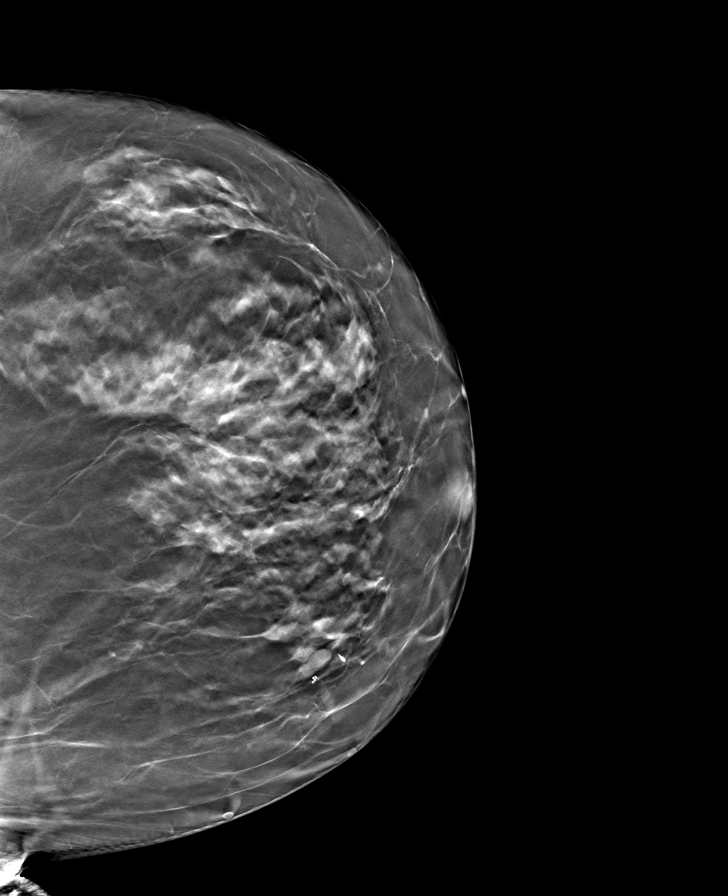

[R MLO tomo · tomo slice 43/84.0]
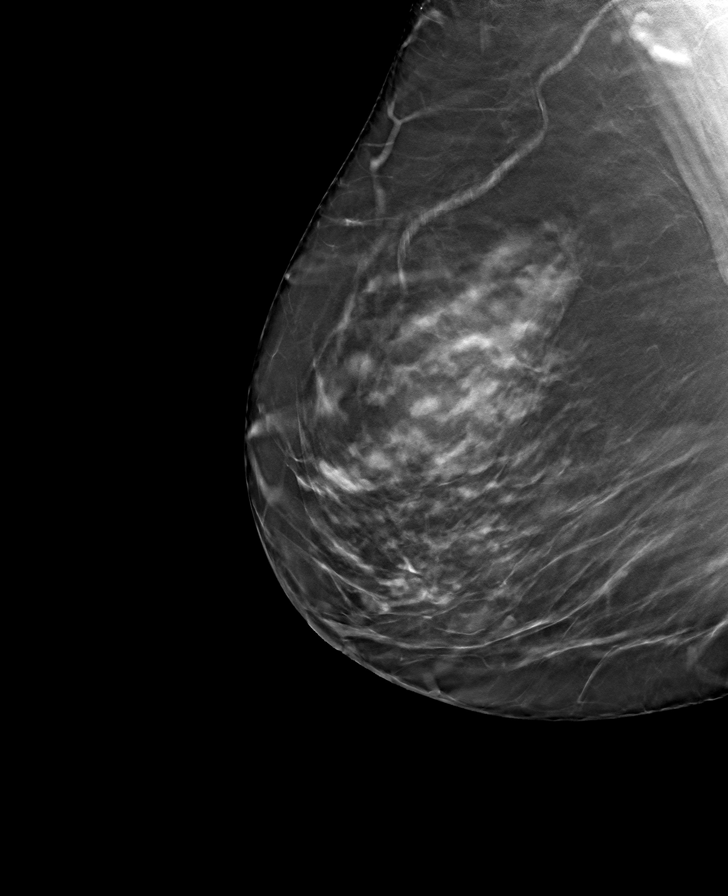

[R CC tomo · tomo slice 37/72.0]
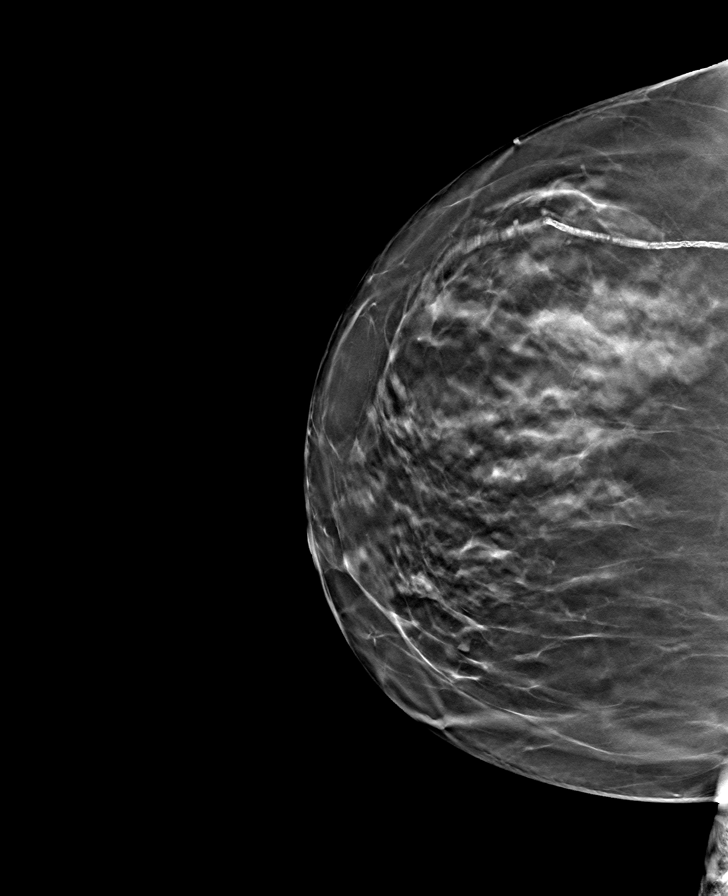

[8 of 24 positions shown; findings below may reference images not displayed]

ACR Breast Density Category c: The breast tissue is heterogeneously
dense, which may obscure small masses.
FINDINGS: There are no findings suspicious for malignancy. Images were
processed with CAD.
IMPRESSION: No mammographic evidence of malignancy. A result letter of this
screening mammogram will be mailed directly to the patient.

RECOMMENDATION:
Screening mammogram in one year. (Code:[id number])

BI-RADS CATEGORY  1: Negative.

## 2019-04-26 ENCOUNTER — Encounter (HOSPITAL_COMMUNITY): Payer: Self-pay | Admitting: Emergency Medicine

## 2019-04-26 ENCOUNTER — Other Ambulatory Visit: Payer: Self-pay

## 2019-04-26 ENCOUNTER — Ambulatory Visit (INDEPENDENT_AMBULATORY_CARE_PROVIDER_SITE_OTHER): Payer: BC Managed Care – PPO

## 2019-04-26 ENCOUNTER — Emergency Department (HOSPITAL_COMMUNITY)
Admission: EM | Admit: 2019-04-26 | Discharge: 2019-04-26 | Disposition: A | Discharge status: Home / Self Care | Payer: BC Managed Care – PPO | Attending: Emergency Medicine | Admitting: Emergency Medicine

## 2019-04-26 ENCOUNTER — Emergency Department (HOSPITAL_COMMUNITY): Payer: BC Managed Care – PPO

## 2019-04-26 ENCOUNTER — Ambulatory Visit (INDEPENDENT_AMBULATORY_CARE_PROVIDER_SITE_OTHER)
Admission: EM | Admit: 2019-04-26 | Discharge: 2019-04-26 | Disposition: A | Discharge status: Home / Self Care | Payer: BC Managed Care – PPO | Source: Home / Self Care

## 2019-04-26 DIAGNOSIS — R05 Cough: Secondary | ICD-10-CM | POA: Diagnosis not present

## 2019-04-26 DIAGNOSIS — U071 COVID-19: Secondary | ICD-10-CM | POA: Insufficient documentation

## 2019-04-26 DIAGNOSIS — R5383 Other fatigue: Secondary | ICD-10-CM | POA: Diagnosis not present

## 2019-04-26 DIAGNOSIS — M791 Myalgia, unspecified site: Secondary | ICD-10-CM

## 2019-04-26 DIAGNOSIS — I10 Essential (primary) hypertension: Secondary | ICD-10-CM | POA: Diagnosis not present

## 2019-04-26 DIAGNOSIS — E0500 Thyrotoxicosis with diffuse goiter without thyrotoxic crisis or storm: Secondary | ICD-10-CM | POA: Insufficient documentation

## 2019-04-26 DIAGNOSIS — Z791 Long term (current) use of non-steroidal anti-inflammatories (NSAID): Secondary | ICD-10-CM | POA: Diagnosis not present

## 2019-04-26 DIAGNOSIS — R0789 Other chest pain: Secondary | ICD-10-CM | POA: Diagnosis not present

## 2019-04-26 DIAGNOSIS — Z79899 Other long term (current) drug therapy: Secondary | ICD-10-CM | POA: Diagnosis not present

## 2019-04-26 DIAGNOSIS — Z888 Allergy status to other drugs, medicaments and biological substances status: Secondary | ICD-10-CM | POA: Insufficient documentation

## 2019-04-26 DIAGNOSIS — Z79890 Hormone replacement therapy (postmenopausal): Secondary | ICD-10-CM | POA: Diagnosis not present

## 2019-04-26 DIAGNOSIS — R059 Cough, unspecified: Secondary | ICD-10-CM

## 2019-04-26 DIAGNOSIS — Z8249 Family history of ischemic heart disease and other diseases of the circulatory system: Secondary | ICD-10-CM | POA: Diagnosis not present

## 2019-04-26 DIAGNOSIS — J1282 Pneumonia due to coronavirus disease 2019: Secondary | ICD-10-CM | POA: Diagnosis not present

## 2019-04-26 LAB — CBC WITH DIFFERENTIAL/PLATELET
Abs Immature Granulocytes: 0.02 10*3/uL (ref 0.00–0.07)
Basophils Absolute: 0 10*3/uL (ref 0.0–0.1)
Basophils Relative: 0 %
Eosinophils Absolute: 0 10*3/uL (ref 0.0–0.5)
Eosinophils Relative: 0 %
HCT: 37 % (ref 36.0–46.0)
Hemoglobin: 12.2 g/dL (ref 12.0–15.0)
Immature Granulocytes: 1 %
Lymphocytes Relative: 30 %
Lymphs Abs: 1.1 10*3/uL (ref 0.7–4.0)
MCH: 29.2 pg (ref 26.0–34.0)
MCHC: 33 g/dL (ref 30.0–36.0)
MCV: 88.5 fL (ref 80.0–100.0)
Monocytes Absolute: 0.2 10*3/uL (ref 0.1–1.0)
Monocytes Relative: 6 %
Neutro Abs: 2.4 10*3/uL (ref 1.7–7.7)
Neutrophils Relative %: 63 %
Platelets: 177 10*3/uL (ref 150–400)
RBC: 4.18 MIL/uL (ref 3.87–5.11)
RDW: 13.8 % (ref 11.5–15.5)
WBC: 3.8 10*3/uL — ABNORMAL LOW (ref 4.0–10.5)
nRBC: 0 % (ref 0.0–0.2)

## 2019-04-26 LAB — TRIGLYCERIDES: Triglycerides: 102 mg/dL (ref <150)

## 2019-04-26 LAB — COMPREHENSIVE METABOLIC PANEL
ALT: 54 U/L — ABNORMAL HIGH (ref 0–44)
AST: 47 U/L — ABNORMAL HIGH (ref 15–41)
Albumin: 3.2 g/dL — ABNORMAL LOW (ref 3.5–5.0)
Alkaline Phosphatase: 84 U/L (ref 38–126)
Anion gap: 12 (ref 5–15)
BUN: 10 mg/dL (ref 8–23)
CO2: 24 mmol/L (ref 22–32)
Calcium: 8.1 mg/dL — ABNORMAL LOW (ref 8.9–10.3)
Chloride: 102 mmol/L (ref 98–111)
Creatinine, Ser: 0.76 mg/dL (ref 0.44–1.00)
GFR calc Af Amer: >60 mL/min (ref >60)
GFR calc non Af Amer: >60 mL/min (ref >60)
Glucose, Bld: 106 mg/dL — ABNORMAL HIGH (ref 70–99)
Potassium: 3.8 mmol/L (ref 3.5–5.1)
Sodium: 138 mmol/L (ref 135–145)
Total Bilirubin: 0.9 mg/dL (ref 0.3–1.2)
Total Protein: 6 g/dL — ABNORMAL LOW (ref 6.5–8.1)

## 2019-04-26 LAB — LACTIC ACID, PLASMA: Lactic Acid, Venous: 1.2 mmol/L (ref 0.5–1.9)

## 2019-04-26 LAB — PROCALCITONIN: Procalcitonin: 0.11 ng/mL

## 2019-04-26 LAB — FERRITIN: Ferritin: 172 ng/mL (ref 11–307)

## 2019-04-26 LAB — FIBRINOGEN: Fibrinogen: 378 mg/dL (ref 210–475)

## 2019-04-26 LAB — D-DIMER, QUANTITATIVE: D-Dimer, Quant: 0.78 ug/mL-FEU — ABNORMAL HIGH (ref 0.00–0.50)

## 2019-04-26 LAB — LACTATE DEHYDROGENASE: LDH: 302 U/L — ABNORMAL HIGH (ref 98–192)

## 2019-04-26 LAB — C-REACTIVE PROTEIN: CRP: 6.4 mg/dL — ABNORMAL HIGH (ref <1.0)

## 2019-04-26 MED ORDER — IOHEXOL 350 MG/ML SOLN
75.0000 mL | Freq: Once | INTRAVENOUS | Status: AC | PRN
  Administered 2019-04-26: 75 mL via INTRAVENOUS

## 2019-04-26 MED ORDER — LORAZEPAM 2 MG/ML IJ SOLN
1.0000 mg | Freq: Once | INTRAMUSCULAR | Status: AC
  Administered 2019-04-26: 1 mg via INTRAVENOUS
  Filled 2019-04-26: qty 1

## 2019-04-26 MED ORDER — SODIUM CHLORIDE 0.9 % IV BOLUS
500.0000 mL | Freq: Once | INTRAVENOUS | Status: AC
  Administered 2019-04-26: 500 mL via INTRAVENOUS

## 2019-04-26 NOTE — ED Notes (Signed)
Patient verbalizes understanding of discharge instructions. Opportunity for questioning and answers were provided. Armband removed by staff, pt discharged from ED. Pt. ambulatory and discharged home.  
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
## 2019-04-26 NOTE — ED Triage Notes (Signed)
Tested positive Thursday. Symptoms worsened Friday. PT was instructed by health department to be evaluated for the COVID infusion.

## 2019-04-26 NOTE — Discharge Instructions (Addendum)
Take Tylenol as needed for fever or discomfort.  Rest and keep yourself hydrated.    Go to the emergency department for further evaluation and treatment.

## 2019-04-26 NOTE — Discharge Instructions (Signed)
As discussed, today's evaluation has been generally reassuring.  Though you do have coronavirus infection, there is no evidence for blood clots, and your vital signs and labs are generally reassuring.  You have received a referral to our outpatient therapy colleagues who will contact you to establish if you are eligible for infusion therapy.  Return here for concerning changes in your condition.

## 2019-04-26 NOTE — ED Notes (Signed)
Called ER charge RN to inform pt needs to be seen for COVID symptoms/ possible admit. Charge RN stated pt is approved for reception at triage. Patient is being discharged from the Urgent Care Center and sent to the Emergency Department via wheelchair by staff. Per Cheryl Cipro, NP patient is stable but in need of higher level of care due to COVID positive, status,  abdnormal CXR. Patient is aware and verbalizes understanding of plan of care.  Vitals:   04/26/19 1509 04/26/19 1511  BP:  (!) 149/69  Pulse: 84 86  Resp: (!) 22 (!) 22  Temp: 98.4 F (36.9 C) 98.4 F (36.9 C)  SpO2: 97% 96%
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
## 2019-04-26 NOTE — ED Triage Notes (Signed)
Pt states she was sent by urgent care and the health department for potential infusion. Pt states she tested positive for COVID on Thursday, symptoms have worsened, cxr done at [facility] showing infiltrates.

## 2019-04-27 ENCOUNTER — Other Ambulatory Visit: Payer: Self-pay | Admitting: Unknown Physician Specialty

## 2019-04-27 ENCOUNTER — Ambulatory Visit (HOSPITAL_COMMUNITY)
Admission: RE | Admit: 2019-04-27 | Discharge: 2019-04-27 | Disposition: A | Discharge status: Home / Self Care | Payer: BC Managed Care – PPO | Source: Ambulatory Visit | Attending: Pulmonary Disease | Admitting: Pulmonary Disease

## 2019-04-27 ENCOUNTER — Telehealth: Payer: Self-pay | Admitting: Unknown Physician Specialty

## 2019-04-27 DIAGNOSIS — U071 COVID-19: Secondary | ICD-10-CM

## 2019-04-27 LAB — SARS CORONAVIRUS 2 (TAT 6-24 HRS): SARS Coronavirus 2: POSITIVE — AB

## 2019-04-27 MED ORDER — EPINEPHRINE 0.3 MG/0.3ML IJ SOAJ: 0.3000 mg | Freq: Once | INTRAMUSCULAR | Status: DC | PRN

## 2019-04-27 MED ORDER — ALBUTEROL SULFATE HFA 108 (90 BASE) MCG/ACT IN AERS: 2.0000 | INHALATION_SPRAY | Freq: Once | RESPIRATORY_TRACT | Status: DC | PRN

## 2019-04-27 MED ORDER — FAMOTIDINE IN NACL 20-0.9 MG/50ML-% IV SOLN: 20.0000 mg | Freq: Once | INTRAVENOUS | Status: DC | PRN

## 2019-04-27 MED ORDER — METHYLPREDNISOLONE SODIUM SUCC 125 MG IJ SOLR: 125.0000 mg | Freq: Once | INTRAMUSCULAR | Status: DC | PRN

## 2019-04-27 MED ORDER — DIPHENHYDRAMINE HCL 50 MG/ML IJ SOLN: 50.0000 mg | Freq: Once | INTRAMUSCULAR | Status: DC | PRN

## 2019-04-27 MED ORDER — SODIUM CHLORIDE 0.9 % IV SOLN
700.0000 mg | Freq: Once | INTRAVENOUS | Status: AC
  Administered 2019-04-27: 700 mg via INTRAVENOUS
  Filled 2019-04-27: qty 700

## 2019-04-27 MED ORDER — SODIUM CHLORIDE 0.9 % IV SOLN
INTRAVENOUS | Status: DC | PRN
  Administered 2019-04-27: 250 mL via INTRAVENOUS

## 2019-04-27 NOTE — Telephone Encounter (Signed)
  I connected by phone with Cheryl [name] on 04/27/2019 at 10:09 AM to discuss the potential use of an new treatment for mild to moderate COVID-19 viral infection in non-hospitalized patients.  This patient is a 64 y.o. female that meets the FDA criteria for Emergency Use Authorization of bamlanivimab or casirivimab\imdevimab.  Has a (+) direct SARS-CoV-2 viral test result  Has mild or moderate COVID-19   Is ? 63 years of age and weighs ? 40 kg  Is NOT hospitalized due to COVID-19  Is NOT requiring oxygen therapy or requiring an increase in baseline oxygen flow rate due to COVID-19  Is within 10 days of symptom onset  Has at least one of the high risk factor(s) for progression to severe COVID-19 and/or hospitalization as defined in EUA.  Specific high risk criteria : BMI >/= 35   I have spoken and communicated the following to the patient or parent/caregiver:  1. FDA has authorized the emergency use of bamlanivimab and casirivimab\imdevimab for the treatment of mild to moderate COVID-19 in adults and pediatric patients with positive results of direct SARS-CoV-2 viral testing who are 19 years of age and older weighing at least 40 kg, and who are at high risk for progressing to severe COVID-19 and/or hospitalization.  2. The significant known and potential risks and benefits of bamlanivimab and casirivimab\imdevimab, and the extent to which such potential risks and benefits are unknown.  3. Information on available alternative treatments and the risks and benefits of those alternatives, including clinical trials.  4. Patients treated with bamlanivimab and casirivimab\imdevimab should continue to self-isolate and use infection control measures (e.g., wear mask, isolate, social distance, avoid sharing personal items, clean and disinfect "high touch" surfaces, and frequent handwashing) according to CDC guidelines.   5. The patient or parent/caregiver has the option to accept or refuse  bamlanivimab or casirivimab\imdevimab .  After reviewing this information with the patient, The patient agreed to proceed with receiving the bamlanimivab infusion and will be provided a copy of the Fact sheet prior to receiving the infusion.[name] 04/27/2019 10:09 AM  Day 6 of symptoms onset

## 2019-04-27 NOTE — Telephone Encounter (Signed)
Called to discuss with patient about Covid symptoms and the use of bamlanivimab, a monoclonal antibody infusion for those with mild to moderate Covid symptoms and at a high risk of hospitalization.  Pt is qualified for this infusion at the [facility] due to BMI>35   Message left to call back  

## 2019-04-27 NOTE — Progress Notes (Signed)
  Diagnosis: COVID-19  Physician: Dr. Wright  Procedure: Covid Infusion Clinic Med: bamlanivimab infusion - Provided patient with bamlanimivab fact sheet for patients, parents and caregivers prior to infusion.  Complications: No immediate complications noted.  Discharge: Discharged home   Cheryl [name] 04/27/2019  

## 2019-05-01 LAB — CULTURE, BLOOD (ROUTINE X 2)
Culture: NO GROWTH
Culture: NO GROWTH

## 2019-05-19 ENCOUNTER — Ambulatory Visit (HOSPITAL_COMMUNITY)
Admission: RE | Admit: 2019-05-19 | Discharge: 2019-05-19 | Disposition: A | Discharge status: Home / Self Care | Payer: BC Managed Care – PPO | Source: Ambulatory Visit | Attending: Surgery | Admitting: Surgery

## 2019-05-19 ENCOUNTER — Other Ambulatory Visit (HOSPITAL_COMMUNITY): Payer: Self-pay | Admitting: Family Medicine

## 2019-05-19 ENCOUNTER — Other Ambulatory Visit: Payer: Self-pay

## 2019-05-19 DIAGNOSIS — M79605 Pain in left leg: Secondary | ICD-10-CM

## 2019-05-19 DIAGNOSIS — U071 COVID-19: Secondary | ICD-10-CM

## 2019-05-19 DIAGNOSIS — M7989 Other specified soft tissue disorders: Secondary | ICD-10-CM | POA: Insufficient documentation

## 2019-05-19 DIAGNOSIS — M79662 Pain in left lower leg: Secondary | ICD-10-CM | POA: Diagnosis not present

## 2019-07-09 ENCOUNTER — Other Ambulatory Visit: Payer: Self-pay | Admitting: Family Medicine

## 2019-07-09 DIAGNOSIS — Z1231 Encounter for screening mammogram for malignant neoplasm of breast: Secondary | ICD-10-CM

## 2019-07-31 ENCOUNTER — Ambulatory Visit
Admission: RE | Admit: 2019-07-31 | Discharge: 2019-07-31 | Disposition: A | Discharge status: Home / Self Care | Payer: BC Managed Care – PPO | Source: Ambulatory Visit | Attending: Family Medicine | Admitting: Family Medicine

## 2019-07-31 ENCOUNTER — Other Ambulatory Visit: Payer: Self-pay

## 2019-07-31 DIAGNOSIS — Z1231 Encounter for screening mammogram for malignant neoplasm of breast: Secondary | ICD-10-CM

## 2019-10-06 ENCOUNTER — Other Ambulatory Visit: Payer: Self-pay | Admitting: *Deleted

## 2019-10-06 DIAGNOSIS — I83899 Varicose veins of unspecified lower extremities with other complications: Secondary | ICD-10-CM

## 2019-10-15 ENCOUNTER — Encounter: Payer: Self-pay | Admitting: Vascular Surgery

## 2019-10-15 ENCOUNTER — Other Ambulatory Visit: Payer: Self-pay

## 2019-10-15 ENCOUNTER — Ambulatory Visit (HOSPITAL_COMMUNITY)
Admission: RE | Admit: 2019-10-15 | Discharge: 2019-10-15 | Disposition: A | Discharge status: Home / Self Care | Payer: BC Managed Care – PPO | Source: Ambulatory Visit | Attending: Vascular Surgery | Admitting: Vascular Surgery

## 2019-10-15 ENCOUNTER — Ambulatory Visit (INDEPENDENT_AMBULATORY_CARE_PROVIDER_SITE_OTHER): Payer: BC Managed Care – PPO | Admitting: Vascular Surgery

## 2019-10-15 VITALS — BP 172/84 | HR 69 | Temp 97.5°F | Resp 18 | Ht 66.0 in | Wt 277.3 lb

## 2019-10-15 DIAGNOSIS — I8393 Asymptomatic varicose veins of bilateral lower extremities: Secondary | ICD-10-CM

## 2019-10-15 DIAGNOSIS — I83899 Varicose veins of unspecified lower extremities with other complications: Secondary | ICD-10-CM | POA: Insufficient documentation

## 2019-10-15 DIAGNOSIS — I872 Venous insufficiency (chronic) (peripheral): Secondary | ICD-10-CM

## 2019-10-15 NOTE — Progress Notes (Signed)
REASON FOR CONSULT:    Chronic venous insufficiency.  The consult is requested by Dr. Brett Fairy.   ASSESSMENT & PLAN:   CHRONIC VENOUS INSUFFICIENCY: This patient has CEAP C4b she has significant symptoms of venous hypertension.  She also has issues with pain behind her left knee and has a Baker's cyst by duplex.  This has previously been aspirated by orthopedics.  She also has some arthritis in her left knee.  That she has multiple issues involved.  However, with respect to her venous symptoms we have had a long discussion about the importance of intermittent leg elevation and the proper positioning for this.  In addition we are trying to fit her for thigh-high compression stockings with a gradient of 20 to 30 mmHg.  These may have to be custom fitted.  I have encouraged her to avoid prolonged sitting and standing.  We discussed importance of exercise specifically walking and water aerobics.  We also discussed the importance of maintaining a healthy weight as central obesity especially increases lower extremity venous pressure.  Given that she is having problems with her left knee water aerobics would be especially helpful as this would limit the discomfort in her knee and also provide significant compression for the venous system.  I plan on seeing her back in 3 months.  If her symptoms are not improved I think she would be a candidate for laser ablation of the left anterior accessory saphenous vein.  This would be cannulated just above the takeoff of this large tributary at the junction of the proximal and mid third of the thigh.  In the meantime she is going to contact her orthopedic surgeon as she is being considered for injection in her left knee.  She also may need her Baker's cyst aspirated again.   Waverly Ferrari, MD Office: [phone]   HPI:   Cheryl [name] is a pleasant 64 y.o. female, who is referred with chronic venous insufficiency.  I have reviewed the records from the  referring office.  The patient was seen on 08/12/2019.  Patient had evidence of chronic venous insufficiency on exam and was sent for vascular consultation.  This patient underwent staged bilateral great saphenous vein ablation by Dr. Hart Rochester in 2014.  More recently she developed a left popliteal DVT and was on Xarelto for 3 months.  This has subsequently been discontinued.  She has multiple symptoms in her legs but especially on the left side.  She feels pressure behind the knee and is previously had a Baker's cyst aspirated in this area.  She also has arthritis in the left knee and is being considered for injection therapy.  With respect to her venous symptoms she describes significant aching pain and heaviness in both legs which is aggravated by standing and relieved with elevation.  She is unable to wear compression stockings that she cannot find any that fit in the when she has caused more discomfort.  She does elevate her legs which helps with her discomfort.  She takes Tylenol for her leg discomfort.  Her symptoms are more significant on the left side.  Past Medical History:  Diagnosis Date  . Anemia   . Chest pain   . Chronic dermatitis   . Edema 06/15/2010  . Edema of both legs   . Grave's disease   . HLD (hyperlipidemia)   . HTN (hypertension)   . Leg edema 09/02/2012  . Obesity   . Pericarditis 10/05/2010  . Thyroid disease 06/15/2010  .  Varicose veins   . 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 Varicose veins of lower extremities with other complications 09/02/2012    Family History  Problem Relation Age of Onset  . Cancer Mother        lung  . Other Mother        varicose veins  . Varicose Veins Mother   . Heart disease Father   . Hyperlipidemia Father   . Hypertension Father   . Other Father        pvd  . Heart attack Father   . Peripheral vascular disease Father        amputation  . Cancer Sister   . Coronary artery disease Other   . Cancer Sister   .  Breast cancer Neg Hx     SOCIAL HISTORY: Social History   Socioeconomic History  . Marital status: Divorced    Spouse name: Not on file  . Number of children: Not on file  . Years of education: Not on file  . Highest education level: Not on file  Occupational History  . Occupation: automobile purifiers  Tobacco Use  . Smoking status: Never Smoker  . Smokeless tobacco: Never Used  Vaping Use  . Vaping Use: Never used  Substance and Sexual Activity  . Alcohol use: No    Alcohol/week: 0.0 standard drinks  . Drug use: No  . Sexual activity: Not on file  Other Topics Concern  . Not on file  Social History Narrative  . Not on file   Social Determinants of Health   Financial Resource Strain:   . Difficulty of Paying Living Expenses: Not on file  Food Insecurity:   . Worried About Programme researcher, broadcasting/film/videounning Out of Food in the Last Year: Not on file  . Ran Out of Food in the Last Year: Not on file  Transportation Needs:   . Lack of Transportation (Medical): Not on file  . Lack of Transportation (Non-Medical): Not on file  Physical Activity:   . Days of Exercise per Week: Not on file  . Minutes of Exercise per Session: Not on file  Stress:   . Feeling of Stress : Not on file  Social Connections:   . Frequency of Communication with Friends and Family: Not on file  . Frequency of Social Gatherings with Friends and Family: Not on file  . Attends Religious Services: Not on file  . Active Member of Clubs or Organizations: Not on file  . Attends BankerClub or Organization Meetings: Not on file  . Marital Status: Not on file  Intimate Partner Violence:   . Fear of Current or Ex-Partner: Not on file  . Emotionally Abused: Not on file  . Physically Abused: Not on file  . Sexually Abused: Not on file    Allergies  Allergen Reactions  . Antihistamines, Chlorpheniramine-Type   . Simvastatin Other (See Comments)    Made her feel bad  . Terbinafine Other (See Comments)    Made her skin gel-up     Current Outpatient Medications  Medication Sig Dispense Refill  . acetaminophen (TYLENOL) 500 MG tablet Take 500 mg by mouth every 6 (six) hours as needed.      Marland Kitchen. levothyroxine (SYNTHROID, LEVOTHROID) 175 MCG tablet Take 175 mcg by mouth daily.   5  . ibuprofen (ADVIL,MOTRIN) 200 MG tablet Take 200 mg by mouth every 6 (six) hours as needed. As needed, when not taking Meloxicam (Patient not taking: Reported on 10/15/2019)    .  meloxicam (MOBIC) 15 MG tablet TAKE 1 TABLET BY MOUTH EVERY DAY (Patient not taking: Reported on 10/15/2019) 30 tablet 0  . phentermine (ADIPEX-P) 37.5 MG tablet Take 37.5 mg by mouth daily. (Patient not taking: Reported on 10/15/2019)  2   No current facility-administered medications for this visit.    REVIEW OF SYSTEMS:  [X]  denotes positive finding, [ ]  denotes negative finding Cardiac  Comments:  Chest pain or chest pressure:    Shortness of breath upon exertion:    Short of breath when lying flat:    Irregular heart rhythm:        Vascular    Pain in calf, thigh, or hip brought on by ambulation:    Pain in feet at night that wakes you up from your sleep:     Blood clot in your veins:    Leg swelling:  x       Pulmonary    Oxygen at home:    Productive cough:     Wheezing:         Neurologic    Sudden weakness in arms or legs:     Sudden numbness in arms or legs:     Sudden onset of difficulty speaking or slurred speech:    Temporary loss of vision in one eye:     Problems with dizziness:         Gastrointestinal    Blood in stool:     Vomited blood:         Genitourinary    Burning when urinating:     Blood in urine:        Psychiatric    Major depression:         Hematologic    Bleeding problems:    Problems with blood clotting too easily:        Skin    Rashes or ulcers:        Constitutional    Fever or chills:     PHYSICAL EXAM:   Vitals:   10/15/19 1450  BP: (!) 172/84  Pulse: 69  Resp: 18  Temp: (!) 97.5 F (36.4 C)   TempSrc: Temporal  SpO2: 100%  Weight: 277 lb 4.8 oz (125.8 kg)  Height: 5\' 6"  (1.676 m)   Body mass index is 44.76 kg/m.  GENERAL: The patient is a well-nourished female, in no acute distress. The vital signs are documented above. CARDIAC: There is a regular rate and rhythm.  VASCULAR: I do not detect carotid bruits. On the right side she has a palpable posterior tibial pulse.  I cannot palpate a dorsalis pedis pulse. On the left side she has a palpable dorsalis pedis and posterior tibial pulse. She has hyperpigmentation and lipodermatosclerosis bilaterally.  She currently does not have any venous ulcers.   I did look at her superficial system in the left thigh myself and she appears to have a large anterior accessory saphenous vein which is significantly dilated to 0.64 centimeters and extends to the junction of the proximal and mid thigh.  There is significant reflux in the segment which then feeds a large tributary. PULMONARY: There is good air exchange bilaterally without wheezing or rales. ABDOMEN: Soft and non-tender with normal pitched bowel sounds.  MUSCULOSKELETAL: There are no major deformities or cyanosis. NEUROLOGIC: No focal weakness or paresthesias are detected. SKIN: There are no ulcers or rashes noted. PSYCHIATRIC: The patient has a normal affect.  DATA:    VENOUS DUPLEX: I have independently interpreted her  venous duplex scan today.  On the right side there is no evidence of DVT or superficial venous thrombosis.  There is deep venous reflux involving the common femoral vein and popliteal vein.  There is no significant superficial venous reflux.  On the left side there is no evidence of DVT or superficial venous thrombosis.  There is deep venous reflux involving the common femoral vein and popliteal vein.  There is superficial venous reflux involving the left great saphenous vein in the thigh which is dilated up to 0.64 cm.  Of note, she did have a Baker's cyst  bilaterally.

## 2020-03-17 ENCOUNTER — Ambulatory Visit (INDEPENDENT_AMBULATORY_CARE_PROVIDER_SITE_OTHER): Payer: BC Managed Care – PPO | Admitting: Vascular Surgery

## 2020-03-17 ENCOUNTER — Other Ambulatory Visit: Payer: Self-pay

## 2020-03-17 ENCOUNTER — Encounter: Payer: Self-pay | Admitting: Vascular Surgery

## 2020-03-17 VITALS — BP 151/80 | HR 78 | Temp 98.1°F | Resp 18 | Ht 65.0 in | Wt 279.5 lb

## 2020-03-17 DIAGNOSIS — I872 Venous insufficiency (chronic) (peripheral): Secondary | ICD-10-CM

## 2020-03-17 NOTE — Progress Notes (Signed)
REASON FOR VISIT:   Follow-up of chronic venous insufficiency  MEDICAL ISSUES:   CHRONIC VENOUS INSUFFICIENCY: This patient has significant venous hypertension with CEAP C4b venous disease.  She continues to have significant symptoms despite conservative treatment including leg elevation, compression stockings, and exercise.  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  I think she would benefit from laser ablation of her anterior accessory saphenous vein on the left.  We would cannulate the vein in the proximal thigh where it gives off some large tributaries. I have discussed the indications for endovenous laser ablation of the , that is to lower the pressure in the veins and potentially help relieve the symptoms from venous hypertension. I have also discussed alternative options including conservative treatment with leg elevation, compression therapy, exercise, avoiding prolonged sitting and standing, and weight management. I have discussed the potential complications of the procedure, including, but not limited to: bleeding, bruising, leg swelling, nerve injury, significant pain from phlebitis, deep venous thrombosis, or failure of the vein to close.  I have also explained that venous insufficiency is a chronic disease, and that the patient is at risk for recurrent varicose veins in the future.  All of the patient's questions were encouraged and answered. They are agreeable to proceed.   We will schedule this in the near future.  HPI:   Cheryl [name] is a pleasant 65 y.o. female who I last saw on 10/15/2019 with chronic venous insufficiency.  She has CEAP C4b  this patient underwent staged bilateral great saphenous vein ablations by Dr. Hart Rochester in 2014.  More recently she developed a left popliteal DVT and was on Xarelto for 3 months.  She is had multiple symptoms in her legs but especially on the left  side.  She had pressure behind her left knee and had a Baker's cyst aspirated in the past.  She also has arthritis in the left knee.  She describes significant aching pain and heaviness in both legs which is aggravated by standing and relieved with elevation.  She was unable to wear compression stockings as she could not find any that fit correctly.  On my exam she had significant hyperpigmentation and lipodermatosclerosis bilaterally as documented in the photograph from the visit on 10/15/2019.  Her formal venous reflux study at that visit showed deep venous reflux involving the common femoral vein and popliteal vein on the left.  There was also reflux in the anterior accessory saphenous vein likely and this was dilated up to 0.64 cm.   Since her last visit, she has been wearing her compression stockings.  This does help her symptoms some.  She is also been trying to elevate her legs at home.  This also helps some.  However she is continuing to have significant aching pain and heaviness in the left leg which is aggravated by standing.  She works all day sitting and standing and her symptoms are worse at the end of the day.  She is had a previous left popliteal DVT.  Her most recent venous duplex scan showed resolution of this although she does have reflux in the left popliteal vein in addition to the common femoral vein.  Past Medical History:  Diagnosis Date  . Anemia   . Chest pain   . Chronic dermatitis   . Edema 06/15/2010  . Edema of both legs   . Grave's disease   .  HLD (hyperlipidemia)   . HTN (hypertension)   . Leg edema 09/02/2012  . Obesity   . Pericarditis 10/05/2010  . Thyroid disease 06/15/2010  . Varicose veins   . 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 Varicose veins of lower extremities with other complications 09/02/2012    Family History  Problem Relation Age of Onset  . Cancer Mother        lung  . Other Mother        varicose veins  . Varicose Veins Mother    . Heart disease Father   . Hyperlipidemia Father   . Hypertension Father   . Other Father        pvd  . Heart attack Father   . Peripheral vascular disease Father        amputation  . Cancer Sister   . Coronary artery disease Other   . Cancer Sister   . Breast cancer Neg Hx     SOCIAL HISTORY: Social History   Tobacco Use  . Smoking status: Never Smoker  . Smokeless tobacco: Never Used  Substance Use Topics  . Alcohol use: No    Alcohol/week: 0.0 standard drinks    Allergies  Allergen Reactions  . Antihistamines, Chlorpheniramine-Type   . Simvastatin Other (See Comments)    Made her feel bad  . Terbinafine Other (See Comments)    Made her skin gel-up    Current Outpatient Medications  Medication Sig Dispense Refill  . ibuprofen (ADVIL,MOTRIN) 200 MG tablet Take 200 mg by mouth every 6 (six) hours as needed. As needed, when not taking Meloxicam    . levothyroxine (SYNTHROID, LEVOTHROID) 175 MCG tablet Take 175 mcg by mouth daily.   5  . acetaminophen (TYLENOL) 500 MG tablet Take 500 mg by mouth every 6 (six) hours as needed.   (Patient not taking: Reported on 03/17/2020)    . meloxicam (MOBIC) 15 MG tablet TAKE 1 TABLET BY MOUTH EVERY DAY (Patient not taking: No sig reported) 30 tablet 0  . phentermine (ADIPEX-P) 37.5 MG tablet Take 37.5 mg by mouth daily. (Patient not taking: No sig reported)  2   No current facility-administered medications for this visit.    REVIEW OF SYSTEMS:  [X]  denotes positive finding, [ ]  denotes negative finding Cardiac  Comments:  Chest pain or chest pressure:    Shortness of breath upon exertion:    Short of breath when lying flat:    Irregular heart rhythm:        Vascular    Pain in calf, thigh, or hip brought on by ambulation:    Pain in feet at night that wakes you up from your sleep:     Blood clot in your veins:    Leg swelling:  x       Pulmonary    Oxygen at home:    Productive cough:     Wheezing:         Neurologic     Sudden weakness in arms or legs:     Sudden numbness in arms or legs:     Sudden onset of difficulty speaking or slurred speech:    Temporary loss of vision in one eye:     Problems with dizziness:         Gastrointestinal    Blood in stool:     Vomited blood:         Genitourinary    Burning when urinating:     Blood  in urine:        Psychiatric    Major depression:         Hematologic    Bleeding problems:    Problems with blood clotting too easily:        Skin    Rashes or ulcers: x       Constitutional    Fever or chills:     PHYSICAL EXAM:   Vitals:   03/17/20 1527  BP: (!) 151/80  Pulse: 78  Resp: 18  Temp: 98.1 F (36.7 C)  TempSrc: Temporal  SpO2: 99%  Weight: 279 lb 8 oz (126.8 kg)  Height: 5\' 5"  (1.651 m)    GENERAL: The patient is a well-nourished female, in no acute distress. The vital signs are documented above. CARDIAC: There is a regular rate and rhythm.  VASCULAR: I do not detect carotid bruits. She has palpable pedal pulses. She has hyperpigmentation and lipodermatosclerois bilt.    I looked at the left anterior accessory saphenous vein again with the SonoSite.  The vein is significantly dilated from the saphenofemoral junction to the junction of the proximal and mid thigh.  The diameter measures 0.65 cm.  At the junction of the proximal and mid thigh it gives off a large tributary that becomes superficial.   PULMONARY: There is good air exchange bilaterally without wheezing or rales. ABDOMEN: Soft and non-tender with normal pitched bowel sounds.  MUSCULOSKELETAL: There are no major deformities or cyanosis. NEUROLOGIC: No focal weakness or paresthesias are detected. SKIN: There are no ulcers or rashes noted. PSYCHIATRIC: The patient has a normal affect.  DATA:    I reviewed her previous venous duplex scan which showed on the left side no evidence of DVT.  There was deep venous reflux involving the common femoral vein and popliteal vein.   There was superficial venous reflux in the anterior accessory saphenous vein on the left.  [facility] [phone]

## 2020-03-31 ENCOUNTER — Encounter: Payer: Self-pay | Admitting: Vascular Surgery

## 2020-04-06 ENCOUNTER — Other Ambulatory Visit: Payer: Self-pay | Admitting: *Deleted

## 2020-04-06 DIAGNOSIS — I83812 Varicose veins of left lower extremities with pain: Secondary | ICD-10-CM

## 2020-04-06 MED ORDER — LORAZEPAM 1 MG PO TABS: ORAL_TABLET | ORAL | 0 refills | Status: DC

## 2020-04-15 ENCOUNTER — Telehealth: Payer: Self-pay

## 2020-04-21 ENCOUNTER — Encounter: Payer: Self-pay | Admitting: Vascular Surgery

## 2020-04-21 ENCOUNTER — Other Ambulatory Visit: Payer: Self-pay

## 2020-04-21 ENCOUNTER — Ambulatory Visit (INDEPENDENT_AMBULATORY_CARE_PROVIDER_SITE_OTHER): Payer: BC Managed Care – PPO | Admitting: Vascular Surgery

## 2020-04-21 VITALS — BP 181/83 | HR 74 | Temp 97.9°F | Resp 16 | Ht 65.0 in | Wt 280.0 lb

## 2020-04-21 DIAGNOSIS — I872 Venous insufficiency (chronic) (peripheral): Secondary | ICD-10-CM

## 2020-04-21 DIAGNOSIS — I83812 Varicose veins of left lower extremities with pain: Secondary | ICD-10-CM | POA: Diagnosis not present

## 2020-04-21 HISTORY — PX: ENDOVENOUS ABLATION SAPHENOUS VEIN W/ LASER: SUR449

## 2020-04-21 NOTE — Progress Notes (Signed)
     Laser Ablation Procedure    Date: 04/21/2020   Cheryl [name] DOB:[birth year]  Consent signed: Yes      Surgeon: Cari Caraway MD   Procedure: Laser Ablation: left Greater Saphenous Vein  BP (!) 181/83 (BP Location: Left Arm, Patient Position: Sitting, Cuff Size: Large)   Pulse 74   Temp 97.9 F (36.6 C) (Temporal)   Resp 16   Ht 5\' 5"  (1.651 m)   Wt 280 lb (127 kg)   SpO2 98%   BMI 46.59 kg/m   Tumescent Anesthesia: 250 cc 0.9% NaCl with 50 cc Lidocaine HCL 1%  and 15 cc 8.4% NaHCO3  Local Anesthesia: 3 cc Lidocaine HCL and NaHCO3 (ratio 2:1)  7 watts continuous mode     Total energy: 583 Joules    Total time: 83 seconds Treatment Length  15 cm  Laser Fiber Ref. #                              Lot #  [id number]     Patient tolerated procedure well  Notes: Patient wore face mask.  All staff members wore facial masks and facial shields/goggles.    Description of Procedure:  After marking the course of the secondary varicosities, the patient was placed on the operating table in the supine position, and the left leg was prepped and draped in sterile fashion.   Local anesthetic was administered and under ultrasound guidance the saphenous vein was accessed with a micro needle and guide wire; then the mirco puncture sheath was placed.  A guide wire was inserted saphenofemoral junction , followed by a 5 french sheath.  The position of the sheath and then the laser fiber below the junction was confirmed using the ultrasound.  Tumescent anesthesia was administered along the course of the saphenous vein using ultrasound guidance. The patient was placed in Trendelenburg position and protective laser glasses were placed on patient and staff, and the laser was fired at 7 watts continuous mode for a total of 583 joules.       Steri strip was applied to the IV insertion site and ABD pads applied to left thigh.    Ace wrap bandages were applied over the left calf and  left  thigh and at the top of the saphenofemoral junction. Blood loss was less than 15 cc.  Discharge instructions reviewed with patient and hardcopy of discharge instructions given to patient to take home. The patient ambulated out of the operating room having tolerated the procedure well.

## 2020-04-21 NOTE — Progress Notes (Signed)
Patient name: Cheryl [name] MRN: [id number] DOB: 1955/03/12 Sex: female  REASON FOR VISIT: For laser ablation of the left anterior accessory saphenous vein.  HPI: Cheryl BOHNE is a 65 y.o. female with a history of chronic insufficiency.  She has CEAP C4b venous disease.  She is undergone staged bilateral great saphenous vein ablations by Dr. Hart Rochester in 2014.  She developed a left popliteal DVT and was on Xarelto for 3 months.  This DVT resolved completely based on her most recent venous duplex scan.  She does have reflux in the deep system involving the common femoral vein and popliteal vein.  She had significant reflux in the superficial system in the anterior sensory saphenous vein on the left.  She was having significant aching pain and heaviness in her left leg.  She had hyperpigmentation and lipodermatosclerosis bilaterally on exam.  She failed conservative treatment and was felt to be a candidate for laser ablation of the left anterior accessory saphenous vein.  I looked at the vein on 03/17/2020.  The vein was significantly dilated from the saphenofemoral junction to the proximal and mid thigh.  Diameter was up to 0.65 cm.  At the junction of the proximal and mid thigh it gave off a large tributary that became superficial.  Current Outpatient Medications  Medication Sig Dispense Refill  . ibuprofen (ADVIL,MOTRIN) 200 MG tablet Take 200 mg by mouth every 6 (six) hours as needed. As needed, when not taking Meloxicam    . levothyroxine (SYNTHROID, LEVOTHROID) 175 MCG tablet Take 175 mcg by mouth daily.   5  . acetaminophen (TYLENOL) 500 MG tablet Take 500 mg by mouth every 6 (six) hours as needed.   (Patient not taking: No sig reported)    . LORazepam (ATIVAN) 1 MG tablet Take 1 tablet 30 minutes prior to leaving house on day of office surgery.  Bring second tablet with you to the office. (Patient not taking: Reported on 04/21/2020) 2 tablet 0  . meloxicam (MOBIC) 15 MG tablet TAKE 1 TABLET BY  MOUTH EVERY DAY (Patient not taking: No sig reported) 30 tablet 0  . phentermine (ADIPEX-P) 37.5 MG tablet Take 37.5 mg by mouth daily. (Patient not taking: No sig reported)  2   No current facility-administered medications for this visit.    PHYSICAL EXAM: Vitals:   04/21/20 1038  BP: (!) 181/83  Pulse: 74  Resp: 16  Temp: 97.9 F (36.6 C)  TempSrc: Temporal  SpO2: 98%  Weight: 127 kg  Height: 5\' 5"  (1.651 m)    PROCEDURE: Laser ablation of left anterior accessory saphenous vein.  TECHNIQUE: The patient was taken to the exam room and placed supine.  I looked at the anterior accessory saphenous vein in the left leg and felt that I could cannulate it at the junction of the proximal mid third of the thigh where the vein then exited the fascia.  The left leg was prepped and draped in usual sterile fashion.  Under ultrasound guidance, after the skin was anesthetized, I cannulated the great saphenous vein in the thigh with a micropuncture needle and a micropuncture sheath was introduced over a wire.  I then advanced the J-wire through the micropuncture sheath and positioned this just below the saphenofemoral junction.  The sheath was then advanced over the wire and the wire and dilator removed.  I then positioned the laser fiber at the end of the sheath and retracted the sheath.  I position the laser fiber 2.5 cm distal  to the saphenofemoral junction.  Tumescent anesthesia was then administered circumferentially around the vein.  Laser ablation was then performed of the left anterior accessory saphenous vein from 2.5 cm distal to the saphenofemoral junction to the junction of the proximal and mid thigh.  Pressure dressing was then applied.  Patient tolerated the procedure well.  She will return in 1 week for a follow-up duplex.  Waverly Ferrari [facility] [place] [phone]

## 2020-04-27 ENCOUNTER — Ambulatory Visit (HOSPITAL_COMMUNITY)
Admission: RE | Admit: 2020-04-27 | Discharge: 2020-04-27 | Disposition: A | Discharge status: Home / Self Care | Payer: BC Managed Care – PPO | Source: Ambulatory Visit | Attending: Vascular Surgery | Admitting: Vascular Surgery

## 2020-04-27 ENCOUNTER — Encounter: Payer: Self-pay | Admitting: Vascular Surgery

## 2020-04-27 ENCOUNTER — Other Ambulatory Visit: Payer: Self-pay

## 2020-04-27 ENCOUNTER — Ambulatory Visit (INDEPENDENT_AMBULATORY_CARE_PROVIDER_SITE_OTHER): Payer: BC Managed Care – PPO | Admitting: Vascular Surgery

## 2020-04-27 VITALS — BP 156/81 | HR 70 | Temp 97.9°F | Resp 20 | Ht 65.0 in | Wt 282.0 lb

## 2020-04-27 DIAGNOSIS — I83812 Varicose veins of left lower extremities with pain: Secondary | ICD-10-CM | POA: Diagnosis present

## 2020-04-27 DIAGNOSIS — I872 Venous insufficiency (chronic) (peripheral): Secondary | ICD-10-CM | POA: Diagnosis not present

## 2020-04-27 NOTE — Progress Notes (Signed)
Patient name: Cheryl [name] MRN: [id number] DOB: 1955/09/07 Sex: female  REASON FOR VISIT: Follow-up after endovenous laser ablation of the left anterior accessory saphenous vein  HPI: Cheryl [name] is a 65 y.o. female who presented with CEAP C4b venous disease.  She had failed conservative treatment.  She previously undergone staged bilateral great saphenous vein ablations by Dr. Hart Rochester in 2014.  She had a left popliteal DVT and was on Xarelto for 3 months.  This ultimately completely resolved.  She had reflux in the deep system and also in an anterior sensory saphenous vein on the left.  On 04/21/2020 she underwent laser ablation of the anterior accessory saphenous vein on the left.  This was treated to the mid thigh.  She comes in for follow-up visit.  She has been wearing her thigh-high stockings although it is difficult to keep them up so I encouraged her to try just Ace bandage on the thigh.  She had moderate bruising.  She has no specific complaints.  She denies any chest pain or shortness of breath.  Current Outpatient Medications  Medication Sig Dispense Refill  . acetaminophen (TYLENOL) 500 MG tablet Take 500 mg by mouth every 6 (six) hours as needed.   (Patient not taking: No sig reported)    . ibuprofen (ADVIL,MOTRIN) 200 MG tablet Take 200 mg by mouth every 6 (six) hours as needed. As needed, when not taking Meloxicam    . levothyroxine (SYNTHROID, LEVOTHROID) 175 MCG tablet Take 175 mcg by mouth daily.   5  . LORazepam (ATIVAN) 1 MG tablet Take 1 tablet 30 minutes prior to leaving house on day of office surgery.  Bring second tablet with you to the office. (Patient not taking: No sig reported) 2 tablet 0  . meloxicam (MOBIC) 15 MG tablet TAKE 1 TABLET BY MOUTH EVERY DAY (Patient not taking: No sig reported) 30 tablet 0  . phentermine (ADIPEX-P) 37.5 MG tablet Take 37.5 mg by mouth daily. (Patient not taking: No sig reported)  2   No current facility-administered medications for  this visit.   REVIEW OF SYSTEMS: Arly.Keller ] denotes positive finding; [  ] denotes negative finding  CARDIOVASCULAR:  [ ]  chest pain   [ ]  dyspnea on exertion  [ ]  leg swelling  CONSTITUTIONAL:  [ ]  fever   [ ]  chills  PHYSICAL EXAM: Vitals:   04/27/20 1408  BP: (!) 156/81  Pulse: 70  Resp: 20  Temp: 97.9 F (36.6 C)  SpO2: 97%  Weight: 127.9 kg  Height: 5\' 5"  (1.651 m)   GENERAL: The patient is a well-nourished female, in no acute distress. The vital signs are documented above. CARDIOVASCULAR: There is a regular rate and rhythm. PULMONARY: There is good air exchange bilaterally without wheezing or rales. VASCULAR: She has mild bruising in her anterior thigh  DATA:  VENOUS DUPLEX: I have independently interpreted the patient's venous duplex scan.  There is no evidence of DVT in the left lower extremity.  The patient is undergone successful laser ablation of the anterior accessory saphenous vein from 1.2 cm distal to the saphenofemoral junction to the mid thigh.  MEDICAL ISSUES:  S/P LASER ABLATION ANTERIOR ACCESSORY SAPHENOUS VEIN: The patient is undergone successful laser ablation of the anterior sensory saphenous vein.  I have encouraged her to continue to wear her compression on the thigh using an Ace bandage for another week and then she can convert to a knee-high stocking.  She should try to elevate her  legs daily.  We have previously discussed the importance of exercise and nutrition.  We have also encouraged her to avoid prolonged sitting and standing.  He has no plans for any further procedures.  I will see her back as needed.  Waverly Ferrari [facility] [place] [phone]

## 2020-04-28 ENCOUNTER — Encounter (HOSPITAL_COMMUNITY): Payer: BC Managed Care – PPO

## 2020-04-28 ENCOUNTER — Ambulatory Visit: Payer: BC Managed Care – PPO | Admitting: Vascular Surgery

## 2020-06-23 ENCOUNTER — Other Ambulatory Visit: Payer: Self-pay | Admitting: Family Medicine

## 2020-06-23 DIAGNOSIS — Z1231 Encounter for screening mammogram for malignant neoplasm of breast: Secondary | ICD-10-CM

## 2020-07-07 NOTE — Telephone Encounter (Signed)
Opened in error

## 2020-08-19 ENCOUNTER — Other Ambulatory Visit: Payer: Self-pay

## 2020-08-19 ENCOUNTER — Ambulatory Visit
Admission: RE | Admit: 2020-08-19 | Discharge: 2020-08-19 | Disposition: A | Discharge status: Home / Self Care | Payer: BC Managed Care – PPO | Source: Ambulatory Visit | Attending: Family Medicine | Admitting: Family Medicine

## 2020-08-19 DIAGNOSIS — Z1231 Encounter for screening mammogram for malignant neoplasm of breast: Secondary | ICD-10-CM

## 2021-07-24 ENCOUNTER — Other Ambulatory Visit: Payer: Self-pay | Admitting: Family Medicine

## 2021-07-24 DIAGNOSIS — Z1231 Encounter for screening mammogram for malignant neoplasm of breast: Secondary | ICD-10-CM

## 2021-08-12 ENCOUNTER — Ambulatory Visit (HOSPITAL_COMMUNITY)
Admission: EM | Admit: 2021-08-12 | Discharge: 2021-08-12 | Disposition: A | Discharge status: Home / Self Care | Payer: BC Managed Care – PPO | Attending: Internal Medicine | Admitting: Internal Medicine

## 2021-08-12 DIAGNOSIS — R600 Localized edema: Secondary | ICD-10-CM | POA: Diagnosis not present

## 2021-08-12 DIAGNOSIS — M79672 Pain in left foot: Secondary | ICD-10-CM | POA: Insufficient documentation

## 2021-08-12 DIAGNOSIS — L03116 Cellulitis of left lower limb: Secondary | ICD-10-CM | POA: Insufficient documentation

## 2021-08-12 LAB — CBC
HCT: 38.5 % (ref 36.0–46.0)
Hemoglobin: 13 g/dL (ref 12.0–15.0)
MCH: 29.8 pg (ref 26.0–34.0)
MCHC: 33.8 g/dL (ref 30.0–36.0)
MCV: 88.3 fL (ref 80.0–100.0)
Platelets: 235 10*3/uL (ref 150–400)
RBC: 4.36 MIL/uL (ref 3.87–5.11)
RDW: 13.2 % (ref 11.5–15.5)
WBC: 6.4 10*3/uL (ref 4.0–10.5)
nRBC: 0 % (ref 0.0–0.2)

## 2021-08-12 LAB — BASIC METABOLIC PANEL
Anion gap: 9 (ref 5–15)
BUN: 18 mg/dL (ref 8–23)
CO2: 26 mmol/L (ref 22–32)
Calcium: 9 mg/dL (ref 8.9–10.3)
Chloride: 108 mmol/L (ref 98–111)
Creatinine, Ser: 0.75 mg/dL (ref 0.44–1.00)
GFR, Estimated: >60 mL/min (ref >60)
Glucose, Bld: 91 mg/dL (ref 70–99)
Potassium: 3.6 mmol/L (ref 3.5–5.1)
Sodium: 143 mmol/L (ref 135–145)

## 2021-08-12 LAB — POCT URINALYSIS DIPSTICK, ED / UC
Bilirubin Urine: NEGATIVE
Glucose, UA: NEGATIVE mg/dL
Hgb urine dipstick: NEGATIVE
Ketones, ur: NEGATIVE mg/dL
Leukocytes,Ua: NEGATIVE
Nitrite: NEGATIVE
Protein, ur: NEGATIVE mg/dL
Specific Gravity, Urine: 1.025 (ref 1.005–1.030)
Urobilinogen, UA: 0.2 mg/dL (ref 0.0–1.0)
pH: 5.5 (ref 5.0–8.0)

## 2021-08-12 MED ORDER — DOXYCYCLINE HYCLATE 100 MG PO CAPS: 100.0000 mg | ORAL_CAPSULE | Freq: Two times a day (BID) | ORAL | 0 refills | Status: DC

## 2021-08-12 MED ORDER — FUROSEMIDE 20 MG PO TABS: 20.0000 mg | ORAL_TABLET | Freq: Every day | ORAL | 0 refills | Status: DC

## 2021-08-12 MED ORDER — ACETAMINOPHEN 500 MG PO TABS: 1000.0000 mg | ORAL_TABLET | Freq: Four times a day (QID) | ORAL | 0 refills | Status: AC | PRN

## 2021-08-12 MED ORDER — POTASSIUM CHLORIDE ER 10 MEQ PO TBCR: 10.0000 meq | EXTENDED_RELEASE_TABLET | Freq: Every day | ORAL | 0 refills | Status: DC

## 2021-08-12 NOTE — Discharge Instructions (Addendum)
Your left foot appears infected.  I would like for you to start taking doxycycline twice daily for the next 10 days to treat the infection to your left foot.  Call the [facility] to schedule an appointment for soon as possible for follow-up on your left foot wound.  You may take Tylenol 1000 mg every 6 hours as needed for foot pain.   Rest, elevate, and compress both of your lower legs to reduce swelling.  I would like you to take 20 mg Lasix and 10 mEq of potassium once per day in the morning with breakfast for the next 5 days to attempt to reduce some of the swelling in your legs.  Call your primary care provider to schedule an appointment for follow-up regarding the swelling in your legs for further management.   If you develop any new or worsening symptoms or do not improve in the next 2 to 3 days, please return.  If your symptoms are severe, please go to the emergency room.  Follow-up with your primary care provider for further evaluation and management of your symptoms as well as ongoing wellness visits.  I hope you feel better!

## 2021-08-12 NOTE — ED Triage Notes (Signed)
Pt reports left foot pain x 2 weeks. She reports a sore on the bottom of her foot.
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
## 2021-08-14 ENCOUNTER — Telehealth (HOSPITAL_COMMUNITY): Payer: Self-pay | Admitting: Emergency Medicine

## 2021-08-14 NOTE — Telephone Encounter (Signed)
Patient called looking for results from recent visit.  Reviewed results, WNL.  Discussed following up with the wound clinic.  Patient also states she had an episode of emesis this morning about 45 minutes after taking her thyroid medicine and doxycycline on an empty stomach.  Encouraged her to take it with food, and if symptoms persist to return call.

## 2021-08-15 ENCOUNTER — Encounter (HOSPITAL_BASED_OUTPATIENT_CLINIC_OR_DEPARTMENT_OTHER): Payer: BC Managed Care – PPO | Attending: General Surgery | Admitting: General Surgery

## 2021-08-15 DIAGNOSIS — I83893 Varicose veins of bilateral lower extremities with other complications: Secondary | ICD-10-CM | POA: Diagnosis not present

## 2021-08-15 DIAGNOSIS — Z9049 Acquired absence of other specified parts of digestive tract: Secondary | ICD-10-CM | POA: Insufficient documentation

## 2021-08-15 DIAGNOSIS — I872 Venous insufficiency (chronic) (peripheral): Secondary | ICD-10-CM | POA: Diagnosis not present

## 2021-08-15 DIAGNOSIS — L97322 Non-pressure chronic ulcer of left ankle with fat layer exposed: Secondary | ICD-10-CM | POA: Diagnosis not present

## 2021-08-15 NOTE — Progress Notes (Signed)
Cheryl [name], Cheryl [name] ([id number]) Visit Report for 08/15/2021 Allergy List Details Patient Name: Date of Service: Cheryl [name], Cheryl [name] 08/15/2021 1:30 PM Medical Record Number: [id number] Patient Account Number: [id number] Date of Birth/Sex: Treating RN: [birth year] (66 y.o. [name] Primary Care Bryan Omura: Christain [place] Other Clinician: Referring Brekyn Huntoon: Treating Demba Nigh/Extender: Valora Piccolo NHO PE, [place] THA RINE Weeks in Treatment: 0 Allergies Active Allergies Antihistamines - Ethylenediamine simvastatin terbinafine Allergy Notes Electronic Signature(s) Signed: 08/15/2021 4:46:37 PM By: Adline Peals Entered By: Adline Peals on 08/15/2021 14:04:48 -------------------------------------------------------------------------------- Arrival Information Details Patient Name: Date of Service: Cheryl Mt. 08/15/2021 1:30 PM Medical Record Number: [id number] Patient Account Number: [id number] Date of Birth/Sex: Treating RN: 01-24-1956 (66 y.o. [name] Primary Care Gina Leblond: Christain [place] Other Clinician: Referring Katy Brickell: Treating Alda Gaultney/Extender: Valora Piccolo NHO PE, [place] THA RINE Weeks in Treatment: 0 Visit Information Patient Arrived: Ambulatory Arrival Time: 14:02 Accompanied By: self Transfer Assistance: None Patient Identification Verified: Yes Secondary Verification Process Completed: Yes Patient Requires Transmission-Based Precautions: No Patient Has Alerts: No Electronic Signature(s) Signed: 08/15/2021 4:46:37 PM By: Adline Peals Entered By: Adline Peals on 08/15/2021 14:02:39 -------------------------------------------------------------------------------- Clinic Level of Care Assessment Details Patient Name: Date of Service: Cheryl [name], Cheryl [name] 08/15/2021 1:30 PM Medical Record Number: [id number] Patient Account Number: [id number] Date of Birth/Sex: Treating RN: December 05, 1955 (66 y.o. [name] Primary  Care Sione Baumgarten: Christain [place] Other Clinician: Referring Larry Knipp: Treating Jacub Waiters/Extender: Valora Piccolo NHO PE, [place] THA RINE Weeks in Treatment: 0 Clinic Level of Care Assessment Items TOOL 1 Quantity Score X- 1 0 Use when EandM and Procedure is performed on INITIAL visit ASSESSMENTS - Nursing Assessment / Reassessment X- 1 20 General Physical Exam (combine w/ comprehensive assessment (listed just below) when performed on new pt. evals) X- 1 25 Comprehensive Assessment (HX, ROS, Risk Assessments, Wounds Hx, etc.) ASSESSMENTS - Wound and Skin Assessment / Reassessment _0  - 0 Dermatologic / Skin Assessment (not related to wound area) ASSESSMENTS - Ostomy and/or Continence Assessment and Care _1  - 0 Incontinence Assessment and Management _2  - 0 Ostomy Care Assessment and Management (repouching, etc.) PROCESS - Coordination of Care X - Simple Patient / Family Education for ongoing care 1 15 _3  - 0 Complex (extensive) Patient / Family Education for ongoing care X- 1 10 Staff obtains Programmer, systems, Records, T Results / Process Orders est _4  - 0 Staff telephones HHA, Nursing Homes / Clarify orders / etc _5  - 0 Routine Transfer to another Facility (non-emergent condition) _6  - 0 Routine Hospital Admission (non-emergent condition) X- 1 15 New Admissions / Biomedical engineer / Ordering NPWT Apligraf, etc. , _7  - 0 Emergency Hospital Admission (emergent condition) PROCESS - Special Needs _8  - 0 Pediatric / Minor Patient Management _9  - 0 Isolation Patient Management _10  - 0 Hearing / Language / Visual special needs _11  - 0 Assessment of Community assistance (transportation, D/C planning, etc.) _12  - 0 Additional assistance / Altered mentation _13  - 0 Support Surface(s) Assessment (bed, cushion, seat, etc.) INTERVENTIONS - Miscellaneous _14  - 0 External ear exam _15  - 0 Patient Transfer (multiple staff / Civil Service fast streamer / Similar devices) _16  - 0 Simple Staple / Suture  removal (25 or less) _17  - 0 Complex Staple / Suture removal (26 or more) _18  - 0 Hypo/Hyperglycemic Management (do not check if billed separately) X- 1 15 Ankle / Brachial Index (ABI) - do not check if billed separately Has the patient been seen at the hospital within the last three years:  Yes Total Score: 100 Level Of Care: New/Established - Level 3 Electronic Signature(s) Signed: 08/15/2021 4:46:37 PM By: Adline Peals Signed: 08/15/2021 4:46:37 PM By: Adline Peals Entered By: Adline Peals on 08/15/2021 14:31:06 -------------------------------------------------------------------------------- Encounter Discharge Information Details Patient Name: Date of Service: Cheryl [name], Cheryl [name] 08/15/2021 1:30 PM Medical Record Number: [id number] Patient Account Number: [id number] Date of Birth/Sex: Treating RN: 04-Mar-1955 (66 y.o. [name] Primary Care Machaela Caterino: Christain [place] Other Clinician: Referring Kaida Games: Treating Adline Kirshenbaum/Extender: Valora Piccolo NHO PE, [place] THA RINE Weeks in Treatment: 0 Encounter Discharge Information Items Post Procedure Vitals Discharge Condition: Stable Temperature (F): 98.5 Ambulatory Status: Ambulatory Pulse (bpm): 79 Discharge Destination: Home Respiratory Rate (breaths/min): 18 Transportation: Private Auto Blood Pressure (mmHg): 179/73 Accompanied By: self Schedule Follow-up Appointment: Yes Clinical Summary of Care: Patient Declined Electronic Signature(s) Signed: 08/15/2021 4:46:37 PM By: Adline Peals Entered By: Adline Peals on 08/15/2021 15:46:34 -------------------------------------------------------------------------------- Lower Extremity Assessment Details Patient Name: Date of Service: Cheryl [name], Cheryl [name] 08/15/2021 1:30 PM Medical Record Number: [id number] Patient Account Number: [id number] Date of Birth/Sex: Treating RN: 1955-03-31 (66 y.o. [name] Primary Care Cliff Damiani: Christain [place] Other Clinician: Referring Temperence Zenor: Treating Eusebio Blazejewski/Extender: Valora Piccolo NHO PE, [place] THA RINE Weeks in Treatment: 0 Edema Assessment Assessed: [Left: No] [Right: No] E[Left: dema] [Right: :] Calf Left: Right: Point of Measurement: From Medial Instep 48.4 cm Ankle Left: Right: Point of Measurement: From Medial Instep 22.6 cm Vascular Assessment Pulses: Dorsalis Pedis Palpable: [Left:Yes] Blood Pressure: Brachial: [Left:179] Ankle: [Left:Dorsalis Pedis: 90] Ankle Brachial Index: [Left:0.50] Electronic Signature(s) Signed: 08/15/2021 4:46:37 PM By: Adline Peals Entered By: Adline Peals on 08/15/2021 14:38:26 -------------------------------------------------------------------------------- Multi Wound Chart Details Patient Name: Date of Service: Cheryl Mt. 08/15/2021 1:30 PM Medical Record Number: [id number] Patient Account Number: [id number] Date of Birth/Sex: Treating RN: Aug 06, 1955 (66 y.o. [name] Primary Care Korynn Kenedy: Christain [place] Other Clinician: Referring Hikari Tripp: Treating Egbert Seidel/Extender: Valora Piccolo NHO PE, [place] THA RINE Weeks in Treatment: 0 Vital Signs Height(in): 67 Pulse(bpm): 79 Weight(lbs): 270 Blood Pressure(mmHg): 179/73 Body Mass Index(BMI): 42.3 Temperature(F): 98.5 Respiratory Rate(breaths/min): 18 Photos: [N/A:N/A] Left, Medial Foot N/A N/A Wound Location: Blister N/A N/A Wounding Event: T be determined o N/A N/A Primary Etiology: Anemia, Lymphedema, Hypertension, N/A N/A Comorbid History: Osteoarthritis 08/01/2021 N/A N/A Date Acquired: 0 N/A N/A Weeks of Treatment: Open N/A N/A Wound Status: No N/A N/A Wound Recurrence: 5.3x2.5x0.1 N/A N/A Measurements L x W x D (cm) 10.407 N/A N/A A (cm) : rea 1.041 N/A N/A Volume (cm) : 0.00% N/A N/A % Reduction in A rea: 0.00% N/A N/A % Reduction in Volume: Full Thickness Without Exposed N/A N/A Classification: Support  Structures Medium N/A N/A Exudate A mount: Serosanguineous N/A N/A Exudate Type: red, brown N/A N/A Exudate Color: Distinct, outline attached N/A N/A Wound Margin: Medium (34-66%) N/A N/A Granulation A mount: Red N/A N/A Granulation Quality: Medium (34-66%) N/A N/A Necrotic A mount: Fat Layer (Subcutaneous Tissue): Yes N/A N/A Exposed Structures: Fascia: No Tendon: No Muscle: No Joint: No Bone: No Small (1-33%) N/A N/A Epithelialization: Debridement - Excisional N/A N/A Debridement: Pre-procedure Verification/Time Out 14:41 N/A N/A Taken: Lidocaine 5% topical ointment N/A N/A Pain Control: Subcutaneous, Slough N/A N/A Tissue Debrided: Skin/Subcutaneous Tissue N/A N/A Level: 13.25 N/A N/A Debridement A (sq cm): rea Curette N/A N/A Instrument: Minimum N/A N/A Bleeding: Pressure N/A N/A Hemostasis A chieved: 5 N/A N/A Procedural Pain: 2 N/A N/A Post Procedural Pain: Procedure was tolerated well N/A N/A Debridement  Treatment Response: 5.3x2.5x0.1 N/A N/A Post Debridement Measurements L x W x D (cm) 1.041 N/A N/A Post Debridement Volume: (cm) Debridement N/A N/A Procedures Performed: Treatment Notes Electronic Signature(s) Signed: 08/15/2021 3:21:10 PM By: Fredirick Maudlin MD FACS Signed: 08/15/2021 4:46:37 PM By: Adline Peals Entered By: Fredirick Maudlin on 08/15/2021 15:21:10 -------------------------------------------------------------------------------- Multi-Disciplinary Care Plan Details Patient Name: Date of Service: Cheryl [name], Cheryl [name] 08/15/2021 1:30 PM Medical Record Number: [id number] Patient Account Number: [id number] Date of Birth/Sex: Treating RN: [birth year] (66 y.o. [name] Primary Care Seleste Tallman: Christain [place] Other Clinician: Referring Karrine Kluttz: Treating Richards Pherigo/Extender: Valora Piccolo NHO PE, [place] THA RINE Weeks in Treatment: 0 Active Inactive Wound/Skin Impairment Nursing Diagnoses: Impaired tissue  integrity Knowledge deficit related to ulceration/compromised skin integrity Goals: Patient/caregiver will verbalize understanding of skin care regimen Date Initiated: 08/15/2021 Target Resolution Date: 09/22/2021 Goal Status: Active Ulcer/skin breakdown will have a volume reduction of 30% by week 4 Date Initiated: 08/15/2021 Target Resolution Date: 09/22/2021 Goal Status: Active Interventions: Assess patient/caregiver ability to obtain necessary supplies Assess patient/caregiver ability to perform ulcer/skin care regimen upon admission and as needed Assess ulceration(s) every visit Treatment Activities: Skin care regimen initiated : 08/15/2021 Topical wound management initiated : 08/15/2021 Notes: Electronic Signature(s) Signed: 08/15/2021 4:46:37 PM By: Adline Peals Entered By: Adline Peals on 08/15/2021 14:29:37 -------------------------------------------------------------------------------- Pain Assessment Details Patient Name: Date of Service: Cheryl [name], Cheryl [name] 08/15/2021 1:30 PM Medical Record Number: [id number] Patient Account Number: [id number] Date of Birth/Sex: Treating RN: [birth year] (66 y.o. [name] Primary Care Torry Adamczak: Christain [place] Other Clinician: Referring Savannah Morford: Treating Corvette Orser/Extender: Valora Piccolo NHO PE, [place] THA RINE Weeks in Treatment: 0 Active Problems Location of Pain Severity and Description of Pain Patient Has Paino No Site Locations Rate the pain. Current Pain Level: 0 Pain Management and Medication Current Pain Management: Electronic Signature(s) Signed: 08/15/2021 4:46:37 PM By: Adline Peals Entered By: Adline Peals on 08/15/2021 14:24:47 -------------------------------------------------------------------------------- Patient/Caregiver Education Details Patient Name: Date of Service: Cheryl Mt 7/11/2023andnbsp1:30 PM Medical Record Number: [id number] Patient Account Number:  [id number] Date of Birth/Gender: Treating RN: [birth year] (66 y.o. [name] Primary Care Physician: Christain [place] Other Clinician: Referring Physician: Treating Physician/Extender: Valora Piccolo NHO PE, [place] THA RINE Weeks in Treatment: 0 Education Assessment Education Provided To: Patient Education Topics Provided Welcome T [facility]: o Handouts: Welcome T [facility] o Methods: Explain/Verbal, Printed Responses: Reinforcements needed, State content correctly Wound/Skin Impairment: Handouts: Caring for Your Ulcer, Skin Care Do's and Dont's Methods: Explain/Verbal, Printed Responses: Reinforcements needed, State content correctly Electronic Signature(s) Signed: 08/15/2021 4:46:37 PM By: Adline Peals Entered By: Adline Peals on 08/15/2021 14:30:08 -------------------------------------------------------------------------------- Wound Assessment Details Patient Name: Date of Service: Cheryl [name], Cheryl [name] 08/15/2021 1:30 PM Medical Record Number: [id number] Patient Account Number: [id number] Date of Birth/Sex: Treating RN: [birth year] (66 y.o. [name] Primary Care Khyrin Trevathan: Christain [place] Other Clinician: Referring Sheyli Horwitz: Treating Kyllie Pettijohn/Extender: Valora Piccolo NHO PE, [place] THA RINE Weeks in Treatment: 0 Wound Status Wound Number: 1 Primary Etiology: T be determined o Wound Location: Left, Medial Foot Wound Status: Open Wounding Event: Blister Comorbid History: Anemia, Lymphedema, Hypertension, Osteoarthritis Date Acquired: 08/01/2021 Weeks Of Treatment: 0 Clustered Wound: No Photos Wound Measurements Length: (cm) 5.3 Width: (cm) 2.5 Depth: (cm) 0.1 Area: (cm) 10.407 Volume: (cm) 1.041 % Reduction in Area: 0% % Reduction in Volume: 0% Epithelialization: Small (1-33%) Tunneling: No Undermining: No Wound Description Classification: Full Thickness Without Exposed Support Structures Wound  Margin: Distinct, outline attached Exudate Amount: Medium Exudate Type: Serosanguineous Exudate Color: red, brown Foul Odor After Cleansing: No Slough/Fibrino Yes Wound Bed Granulation Amount: Medium (34-66%) Exposed Structure Granulation Quality: Red Fascia Exposed: No Necrotic Amount: Medium (34-66%) Fat Layer (Subcutaneous Tissue) Exposed: Yes Necrotic Quality: Adherent Slough Tendon Exposed: No Muscle Exposed: No Joint Exposed: No Bone Exposed: No Treatment Notes Wound #1 (Foot) Wound Laterality: Left, Medial Cleanser Soap and Water Discharge Instruction: May shower and wash wound with dial antibacterial soap and water prior to dressing change. Wound Cleanser Discharge Instruction: Cleanse the wound with wound cleanser prior to applying a clean dressing using gauze sponges, not tissue or cotton balls. [facility] Ancillary Kit - 15 Day Supply Discharge Instruction: Use supplies as instructed; Kit contains: (15) Saline Bullets; (15) 3x3 Gauze; 15 pr Gloves Peri-Wound Care Topical Gentamicin Discharge Instruction: As directed by physician Primary Dressing KerraCel Ag Gelling Fiber Dressing, 2x2 in (silver alginate) Discharge Instruction: Apply silver alginate to wound bed as instructed Secondary Dressing ALLEVYN Gentle Border, 5x5 (in/in) Discharge Instruction: Apply over primary dressing as directed. Secured With Compression Wrap Compression Stockings Environmental education officer) Signed: 08/15/2021 4:46:37 PM By: Adline Peals Entered By: Adline Peals on 08/15/2021 14:24:33 -------------------------------------------------------------------------------- Vitals Details Patient Name: Date of Service: Cheryl Mt. 08/15/2021 1:30 PM Medical Record Number: [id number] Patient Account Number: [id number] Date of Birth/Sex: Treating RN: February 01, 1956 (66 y.o. [name] Primary Care Presley Summerlin: Christain [place] Other Clinician: Referring  Jaelon Gatley: Treating Lejon Afzal/Extender: Valora Piccolo NHO PE, [place] THA RINE Weeks in Treatment: 0 Vital Signs Time Taken: 14:02 Temperature (F): 98.5 Height (in): 67 Pulse (bpm): 79 Source: Stated Respiratory Rate (breaths/min): 18 Weight (lbs): 270 Blood Pressure (mmHg): 179/73 Source: Stated Reference Range: 80 - 120 mg / dl Body Mass Index (BMI): 42.3 Electronic Signature(s) Signed: 08/15/2021 4:46:37 PM By: Adline Peals Entered By: Adline Peals on 08/15/2021 14:03:07

## 2021-08-15 NOTE — Progress Notes (Signed)
RONAE, NOELL ([id number]) Visit Report for 08/15/2021 Chief Complaint Document Details Patient Name: Date of Service: Cheryl [name], Cheryl [name] 08/15/2021 1:30 PM Medical Record Number: [id number] Patient Account Number: [id number] Date of Birth/Sex: Treating RN: [birth year] (66 y.o. [name] Primary Care Provider: Christain [place] Other Clinician: Referring Provider: Treating Provider/Extender: Valora Piccolo NHO [place], [place] THA RINE Weeks in Treatment: 0 Information Obtained from: Patient Chief Complaint Patient presents for treatment of an open ulcer due to venous insufficiency Electronic Signature(s) Signed: 08/15/2021 3:21:21 PM By: Fredirick Maudlin MD FACS Entered By: Fredirick Maudlin on 08/15/2021 15:21:20 -------------------------------------------------------------------------------- Debridement Details Patient Name: Date of Service: Cheryl Mt 08/15/2021 1:30 PM Medical Record Number: [id number] Patient Account Number: [id number] Date of Birth/Sex: Treating RN: 1955/11/05 (66 y.o. [name] Primary Care Provider: Christain [place] Other Clinician: Referring Provider: Treating Provider/Extender: Valora Piccolo NHO [place], [place] THA RINE Weeks in Treatment: 0 Debridement Performed for Assessment: Wound #1 Left,Medial Foot Performed By: Physician Fredirick Maudlin, MD Debridement Type: Debridement Level of Consciousness (Pre-procedure): Awake and Alert Pre-procedure Verification/Time Out Yes - 14:41 Taken: Start Time: 14:41 Pain Control: Lidocaine 5% topical ointment T Area Debrided (L x W): otal 5.3 (cm) x 2.5 (cm) = 13.25 (cm) Tissue and other material debrided: Viable, Non-Viable, Slough, Subcutaneous, Slough Level: Skin/Subcutaneous Tissue Debridement Description: Excisional Instrument: Curette Bleeding: Minimum Hemostasis Achieved: Pressure Procedural Pain: 5 Post Procedural Pain: 2 Response to Treatment: Procedure was tolerated well Level of  Consciousness (Post- Awake and Alert procedure): Post Debridement Measurements of Total Wound Length: (cm) 5.3 Width: (cm) 2.5 Depth: (cm) 0.1 Volume: (cm) 1.041 Character of Wound/Ulcer Post Debridement: Improved Post Procedure Diagnosis Same as Pre-procedure Electronic Signature(s) Signed: 08/15/2021 4:27:02 PM By: Fredirick Maudlin MD FACS Signed: 08/15/2021 4:46:37 PM By: Adline Peals Entered By: Adline Peals on 08/15/2021 14:42:08 -------------------------------------------------------------------------------- HPI Details Patient Name: Date of Service: Cheryl Mt. 08/15/2021 1:30 PM Medical Record Number: [id number] Patient Account Number: [id number] Date of Birth/Sex: Treating RN: 01/20/1956 (66 y.o. [name] Primary Care Provider: Christain [place] Other Clinician: Referring Provider: Treating Provider/Extender: Valora Piccolo NHO [place], [place] THA RINE Weeks in Treatment: 0 History of Present Illness HPI Description: ADMISSION 08/15/2021 This is a 66 year old non-smoker, nondiabetic, with a past medical history significant for significant venous insufficiency and reflux. She has previously undergone bilateral saphenous vein ablations. She wears compression stockings on a regular basis. About 2 weeks ago, she developed a blister on her left medial ankle. She thought perhaps it had rubbed on her shoes. As it became more painful and red, she was seen in the emergency department. She was diagnosed with cellulitis and prescribed doxycycline. She was referred to the wound care center for further evaluation and management. She has a geographic cluster of small open wounds on her right medial ankle, just below the malleolus. There is no erythema or induration associated with the wounds. No purulent drainage or odor. They are fairly sensitive. Electronic Signature(s) Signed: 08/15/2021 3:23:32 PM By: Fredirick Maudlin MD FACS Entered By: Fredirick Maudlin on  08/15/2021 15:23:32 -------------------------------------------------------------------------------- Physical Exam Details Patient Name: Date of Service: Cheryl [name], Cheryl [name] 08/15/2021 1:30 PM Medical Record Number: [id number] Patient Account Number: [id number] Date of Birth/Sex: Treating RN: Oct 13, 1955 (66 y.o. [name] Primary Care Provider: Christain [place] Other Clinician: Referring Provider: Treating Provider/Extender: Valora Piccolo NHO [place], [place] THA RINE Weeks in Treatment: 0 Constitutional Hypertensive, asymptomatic. . . . Morbidly obese. No acute distress. Respiratory Normal work of  breathing on room air.. Notes 08/15/2021: She has a geographic cluster of small open wounds on her right medial ankle, just below the malleolus. There is no erythema or induration associated with the wounds. No purulent drainage or odor. They are fairly sensitive. Electronic Signature(s) Signed: 08/15/2021 3:25:02 PM By: Fredirick Maudlin MD FACS Entered By: Fredirick Maudlin on 08/15/2021 15:25:01 -------------------------------------------------------------------------------- Physician Orders Details Patient Name: Date of Service: Cheryl [name], Cheryl [name] 08/15/2021 1:30 PM Medical Record Number: [id number] Patient Account Number: [id number] Date of Birth/Sex: Treating RN: 05/17/1955 (66 y.o. [name] Primary Care Provider: Christain [place] Other Clinician: Referring Provider: Treating Provider/Extender: Valora Piccolo NHO [place], [place] THA RINE Weeks in Treatment: 0 Verbal / Phone Orders: No Diagnosis Coding ICD-10 Coding Code Description B55.974 Non-pressure chronic ulcer of left ankle with fat layer exposed I87.2 Venous insufficiency (chronic) (peripheral) B63.845 Varicose veins of bilateral lower extremities with other complications Follow-up Appointments ppointment in 1 week. - Dr. Celine Ahr - Room 3 - 7/19 at 3:15 Return A Bathing/ Shower/ Hygiene May shower and wash  wound with soap and water. - with dressing changes Edema Control - Lymphedema / SCD / Other Elevate legs to the level of the heart or above for 30 minutes daily and/or when sitting, a frequency of: Avoid standing for long periods of time. Patient to wear own compression stockings every day. Wound Treatment Wound #1 - Foot Wound Laterality: Left, Medial Cleanser: Soap and Water 1 x Per XMI/68 Days Discharge Instructions: May shower and wash wound with dial antibacterial soap and water prior to dressing change. Cleanser: Wound Cleanser 1 x Per Day/15 Days Discharge Instructions: Cleanse the wound with wound cleanser prior to applying a clean dressing using gauze sponges, not tissue or cotton balls. Cleanser: Byram Ancillary Kit - 15 Day Supply (DME) (Generic) 1 x Per Day/15 Days Discharge Instructions: Use supplies as instructed; Kit contains: (15) Saline Bullets; (15) 3x3 Gauze; 15 pr Gloves Topical: Gentamicin 1 x Per Day/15 Days Discharge Instructions: As directed by physician Prim Dressing: KerraCel Ag Gelling Fiber Dressing, 2x2 in (silver alginate) (DME) (Generic) 1 x Per Day/15 Days ary Discharge Instructions: Apply silver alginate to wound bed as instructed Secondary Dressing: ALLEVYN Gentle Border, 5x5 (in/in) (DME) (Generic) 1 x Per Day/15 Days Discharge Instructions: Apply over primary dressing as directed. Patient Medications llergies: Antihistamines - Ethylenediamine, simvastatin, terbinafine A Notifications Medication Indication Start End 08/15/2021 gentamicin DOSE topical 0.1 % ointment - apply thin layer to wound with daily dressing changes Electronic Signature(s) Signed: 08/15/2021 3:30:06 PM By: Fredirick Maudlin MD FACS Previous Signature: 08/15/2021 3:25:55 PM Version By: Fredirick Maudlin MD FACS Entered By: Fredirick Maudlin on 08/15/2021 15:30:06 -------------------------------------------------------------------------------- Problem List Details Patient Name: Date of  Service: Cheryl Mt. 08/15/2021 1:30 PM Medical Record Number: [id number] Patient Account Number: [id number] Date of Birth/Sex: Treating RN: [birth year] (66 y.o. [name] Primary Care Provider: Christain [place] Other Clinician: Referring Provider: Treating Provider/Extender: Valora Piccolo NHO [place], [place] THA RINE Weeks in Treatment: 0 Active Problems ICD-10 Encounter Code Description Active Date MDM Diagnosis L97.322 Non-pressure chronic ulcer of left ankle with fat layer exposed 08/15/2021 No Yes I87.2 Venous insufficiency (chronic) (peripheral) 08/15/2021 No Yes I83.893 Varicose veins of bilateral lower extremities with other complications 5/00/3704 No Yes Inactive Problems Resolved Problems Electronic Signature(s) Signed: 08/15/2021 3:21:04 PM By: Fredirick Maudlin MD FACS Entered By: Fredirick Maudlin on 08/15/2021 15:21:04 -------------------------------------------------------------------------------- Progress Note Details Patient Name: Date of Service: Cheryl Blew B. 08/15/2021 1:30 PM Medical Record Number:  [id number] Patient Account Number: [id number] Date of Birth/Sex: Treating RN: 11/17/1955 (66 y.o. [name] Primary Care Provider: Christain [place] Other Clinician: Referring Provider: Treating Provider/Extender: Valora Piccolo NHO [place], [place] THA RINE Weeks in Treatment: 0 Subjective Chief Complaint Information obtained from Patient Patient presents for treatment of an open ulcer due to venous insufficiency History of Present Illness (HPI) ADMISSION 08/15/2021 This is a 66 year old non-smoker, nondiabetic, with a past medical history significant for significant venous insufficiency and reflux. She has previously undergone bilateral saphenous vein ablations. She wears compression stockings on a regular basis. About 2 weeks ago, she developed a blister on her left medial ankle. She thought perhaps it had rubbed on her shoes. As it became  more painful and red, she was seen in the emergency department. She was diagnosed with cellulitis and prescribed doxycycline. She was referred to the wound care center for further evaluation and management. She has a geographic cluster of small open wounds on her right medial ankle, just below the malleolus. There is no erythema or induration associated with the wounds. No purulent drainage or odor. They are fairly sensitive. Patient History Information obtained from Patient. Allergies Antihistamines - Ethylenediamine, simvastatin, terbinafine Family History Cancer - Mother,Siblings, Heart Disease - Father, Hypertension - Father, Stroke - Siblings, No family history of Diabetes, Hereditary Spherocytosis, Kidney Disease, Lung Disease, Seizures, Thyroid Problems, Tuberculosis. Social History Never smoker, Marital Status - Divorced, Alcohol Use - Never, Drug Use - No History, Caffeine Use - Daily. Medical History Hematologic/Lymphatic Patient has history of Anemia, Lymphedema Cardiovascular Patient has history of Hypertension Musculoskeletal Patient has history of Osteoarthritis Hospitalization/Surgery History - endovenous ablation saphenous vein with laser. - breast biopsy. - cholecystectomy. - skin graft. Medical A Surgical History Notes nd Constitutional Symptoms (General Health) obesity Hematologic/Lymphatic Grave's disease Cardiovascular varicose veins, pericarditis Review of Systems (ROS) Constitutional Symptoms (General Health) Denies complaints or symptoms of Fatigue, Fever, Chills, Marked Weight Change. Eyes Complains or has symptoms of Glasses / Contacts - reading glasses. Ear/Nose/Mouth/Throat Denies complaints or symptoms of Chronic sinus problems or rhinitis. Respiratory Complains or has symptoms of Shortness of Breath - after activity. Gastrointestinal Denies complaints or symptoms of Frequent diarrhea, Nausea, Vomiting. Endocrine Denies complaints or symptoms of  Heat/cold intolerance. Genitourinary Denies complaints or symptoms of Frequent urination. Integumentary (Skin) Complains or has symptoms of Wounds. Musculoskeletal Denies complaints or symptoms of Muscle Pain, Muscle Weakness. Neurologic Denies complaints or symptoms of Numbness/parasthesias. Psychiatric Complains or has symptoms of Claustrophobia. Denies complaints or symptoms of Suicidal. Objective Constitutional Hypertensive, asymptomatic. Morbidly obese. No acute distress. Vitals Time Taken: 2:02 PM, Height: 67 in, Source: Stated, Weight: 270 lbs, Source: Stated, BMI: 42.3, Temperature: 98.5 F, Pulse: 79 bpm, Respiratory Rate: 18 breaths/min, Blood Pressure: 179/73 mmHg. Respiratory Normal work of breathing on room air.. General Notes: 08/15/2021: She has a geographic cluster of small open wounds on her right medial ankle, just below the malleolus. There is no erythema or induration associated with the wounds. No purulent drainage or odor. They are fairly sensitive. Integumentary (Hair, Skin) Wound #1 status is Open. Original cause of wound was Blister. The date acquired was: 08/01/2021. The wound is located on the Left,Medial Foot. The wound measures 5.3cm length x 2.5cm width x 0.1cm depth; 10.407cm^2 area and 1.041cm^3 volume. There is Fat Layer (Subcutaneous Tissue) exposed. There is no tunneling or undermining noted. There is a medium amount of serosanguineous drainage noted. The wound margin is distinct with the outline attached to the wound base.  There is medium (34-66%) red granulation within the wound bed. There is a medium (34-66%) amount of necrotic tissue within the wound bed including Adherent Slough. Assessment Active Problems ICD-10 Non-pressure chronic ulcer of left ankle with fat layer exposed Venous insufficiency (chronic) (peripheral) Varicose veins of bilateral lower extremities with other complications Procedures Wound #1 Pre-procedure diagnosis of Wound #1  is a T be determined located on the Left,Medial Foot . There was a Excisional Skin/Subcutaneous Tissue Debridement o with a total area of 13.25 sq cm performed by Fredirick Maudlin, MD. With the following instrument(s): Curette to remove Viable and Non-Viable tissue/material. Material removed includes Subcutaneous Tissue and Slough and after achieving pain control using Lidocaine 5% topical ointment. No specimens were taken. A time out was conducted at 14:41, prior to the start of the procedure. A Minimum amount of bleeding was controlled with Pressure. The procedure was tolerated well with a pain level of 5 throughout and a pain level of 2 following the procedure. Post Debridement Measurements: 5.3cm length x 2.5cm width x 0.1cm depth; 1.041cm^3 volume. Character of Wound/Ulcer Post Debridement is improved. Post procedure Diagnosis Wound #1: Same as Pre-Procedure Plan Follow-up Appointments: Return Appointment in 1 week. - Dr. Celine Ahr - Room 3 - 7/19 at 3:15 Bathing/ Shower/ Hygiene: May shower and wash wound with soap and water. - with dressing changes Edema Control - Lymphedema / SCD / Other: Elevate legs to the level of the heart or above for 30 minutes daily and/or when sitting, a frequency of: Avoid standing for long periods of time. Patient to wear own compression stockings every day. The following medication(s) was prescribed: gentamicin topical 0.1 % ointment apply thin layer to wound with daily dressing changes starting 08/15/2021 WOUND #1: - Foot Wound Laterality: Left, Medial Cleanser: Soap and Water 1 x Per Day/15 Days Discharge Instructions: May shower and wash wound with dial antibacterial soap and water prior to dressing change. Cleanser: Wound Cleanser 1 x Per Day/15 Days Discharge Instructions: Cleanse the wound with wound cleanser prior to applying a clean dressing using gauze sponges, not tissue or cotton balls. Cleanser: Byram Ancillary Kit - 15 Day Supply (DME) (Generic) 1  x Per Day/15 Days Discharge Instructions: Use supplies as instructed; Kit contains: (15) Saline Bullets; (15) 3x3 Gauze; 15 pr Gloves Topical: Gentamicin 1 x Per Day/15 Days Discharge Instructions: As directed by physician Prim Dressing: KerraCel Ag Gelling Fiber Dressing, 2x2 in (silver alginate) (DME) (Generic) 1 x Per Day/15 Days ary Discharge Instructions: Apply silver alginate to wound bed as instructed Secondary Dressing: ALLEVYN Gentle Border, 5x5 (in/in) (DME) (Generic) 1 x Per Day/15 Days Discharge Instructions: Apply over primary dressing as directed. 08/15/2021: This is a 66 year old woman with a history of venous insufficiency, status post bilateral saphenous vein ablations. 2 weeks ago, she developed a wound that appears to be venous in etiology. She has a geographic cluster of small open wounds on her right medial ankle, just below the malleolus. There is no erythema or induration associated with the wounds. No purulent drainage or odor. They are fairly sensitive. I used a curette to debride slough, eschar, and nonviable subcutaneous tissue from the open areas of her wound. She should complete her course of doxycycline, as prescribed by the emergency department. We are going to apply topical gentamicin to the wound covered with silver alginate. She declines compression wraps today, saying she did not tolerate them in the past. We will allow her to wear her normal compression stockings over the dressing; she  says they are 20-30 mmHg compression. She will follow-up in 1 week. Electronic Signature(s) Signed: 08/15/2021 3:30:20 PM By: Fredirick Maudlin MD FACS Previous Signature: 08/15/2021 3:27:19 PM Version By: Fredirick Maudlin MD FACS Entered By: Fredirick Maudlin on 08/15/2021 15:30:20 -------------------------------------------------------------------------------- HxROS Details Patient Name: Date of Service: Cheryl [name], Cheryl [name] 08/15/2021 1:30 PM Medical Record Number:  [id number] Patient Account Number: [id number] Date of Birth/Sex: Treating RN: [birth year] (66 y.o. [name] Primary Care Provider: Christain [place] Other Clinician: Referring Provider: Treating Provider/Extender: Valora Piccolo NHO PE, [place] THA RINE Weeks in Treatment: 0 Information Obtained From Patient Constitutional Symptoms (General Health) Complaints and Symptoms: Negative for: Fatigue; Fever; Chills; Marked Weight Change Medical History: Past Medical History Notes: obesity Eyes Complaints and Symptoms: Positive for: Glasses / Contacts - reading glasses Ear/Nose/Mouth/Throat Complaints and Symptoms: Negative for: Chronic sinus problems or rhinitis Respiratory Complaints and Symptoms: Positive for: Shortness of Breath - after activity Gastrointestinal Complaints and Symptoms: Negative for: Frequent diarrhea; Nausea; Vomiting Endocrine Complaints and Symptoms: Negative for: Heat/cold intolerance Genitourinary Complaints and Symptoms: Negative for: Frequent urination Integumentary (Skin) Complaints and Symptoms: Positive for: Wounds Musculoskeletal Complaints and Symptoms: Negative for: Muscle Pain; Muscle Weakness Medical History: Positive for: Osteoarthritis Neurologic Complaints and Symptoms: Negative for: Numbness/parasthesias Psychiatric Complaints and Symptoms: Positive for: Claustrophobia Negative for: Suicidal Hematologic/Lymphatic Medical History: Positive for: Anemia; Lymphedema Past Medical History Notes: Grave's disease Cardiovascular Medical History: Positive for: Hypertension Past Medical History Notes: varicose veins, pericarditis Immunological Oncologic Immunizations Pneumococcal Vaccine: Received Pneumococcal Vaccination: Yes Received Pneumococcal Vaccination On or After 60th Birthday: Yes Implantable Devices None Hospitalization / Surgery History Type of Hospitalization/Surgery endovenous ablation saphenous vein  with laser breast biopsy cholecystectomy skin graft Family and Social History Cancer: Yes - Mother,Siblings; Diabetes: No; Heart Disease: Yes - Father; Hereditary Spherocytosis: No; Hypertension: Yes - Father; Kidney Disease: No; Lung Disease: No; Seizures: No; Stroke: Yes - Siblings; Thyroid Problems: No; Tuberculosis: No; Never smoker; Marital Status - Divorced; Alcohol Use: Never; Drug Use: No History; Caffeine Use: Daily; Financial Concerns: No; Food, Clothing or Shelter Needs: No; Support System Lacking: No; Transportation Concerns: No Electronic Signature(s) Signed: 08/15/2021 4:27:02 PM By: Fredirick Maudlin MD FACS Signed: 08/15/2021 4:46:37 PM By: Adline Peals Entered By: Adline Peals on 08/15/2021 14:15:05 -------------------------------------------------------------------------------- SuperBill Details Patient Name: Date of Service: Cheryl Mt 08/15/2021 Medical Record Number: [id number] Patient Account Number: [id number] Date of Birth/Sex: Treating RN: 1955-03-25 (66 y.o. [name] Primary Care Provider: Christain [place] Other Clinician: Referring Provider: Treating Provider/Extender: Valora Piccolo NHO PE, [place] THA RINE Weeks in Treatment: 0 Diagnosis Coding ICD-10 Codes Code Description [phone] Non-pressure chronic ulcer of left ankle with fat layer exposed I87.2 Venous insufficiency (chronic) (peripheral) F09.323 Varicose veins of bilateral lower extremities with other complications Facility Procedures CPT4 Code: [id number] 9 Description: 9213 - WOUND CARE VISIT-LEV 3 EST PT Modifier: 25 Quantity: 1 CPT4 Code: [id number] 1 L Description: 7628 - DEB SUBQ TISSUE 20 SQ CM/< ICD-10 Diagnosis Description 97.322 Non-pressure chronic ulcer of left ankle with fat layer exposed Modifier: Quantity: 1 Physician Procedures : CPT4 Code Description Modifier [id number] - WC PHYS LEVEL 4 - NEW PT 25 ICD-10 Diagnosis Description L97.322 Non-pressure  chronic ulcer of left ankle with fat layer exposed I87.2 Venous insufficiency (chronic) (peripheral) T06.269 Varicose veins of  bilateral lower extremities with other complications Quantity: 1 : [id number] 11042 - WC PHYS SUBQ TISS 20 SQ CM ICD-10 Diagnosis Description L97.322 Non-pressure chronic ulcer of left ankle with fat layer  exposed Quantity: 1 Electronic Signature(s) Signed: 08/15/2021 3:30:47 PM By: Fredirick Maudlin MD FACS Entered By: Fredirick Maudlin on 08/15/2021 15:30:46

## 2021-08-15 NOTE — Progress Notes (Signed)
Cheryl, [name] ([id number]) Visit Report for 08/15/2021 Abuse Risk Screen Details Patient Name: Date of Service: Cheryl [name], Cheryl [name] 08/15/2021 1:30 PM Medical Record Number: [id number] Patient Account Number: [id number] Date of Birth/Sex: Treating RN: 1955-04-24 (67 y.o. Cheryl [name] Primary Care Janne Faulk: Barbie Banner Other Clinician: Referring Borghild Thaker: Treating Avalina Benko/Extender: Stevan Born NHO PE, [place] THA RINE Weeks in Treatment: 0 Abuse Risk Screen Items Answer ABUSE RISK SCREEN: Has anyone close to you tried to hurt or harm you recentlyo No Do you feel uncomfortable with anyone in your familyo No Has anyone forced you do things that you didnt want to doo No Electronic Signature(s) Signed: 08/15/2021 4:46:37 PM By: Samuella Bruin Entered By: Samuella Bruin on 08/15/2021 14:15:16 -------------------------------------------------------------------------------- Activities of Daily Living Details Patient Name: Date of Service: Cheryl, [name] 08/15/2021 1:30 PM Medical Record Number: [id number] Patient Account Number: [id number] Date of Birth/Sex: Treating RN: [birth year] (66 y.o. Cheryl [name] Primary Care Meryle Pugmire: Barbie Banner Other Clinician: Referring Antwian Santaana: Treating Delois Silvester/Extender: Stevan Born NHO PE, [place] THA RINE Weeks in Treatment: 0 Activities of Daily Living Items Answer Activities of Daily Living (Please select one for each item) Drive Automobile Completely Able T Medications ake Completely Able Use T elephone Completely Able Care for Appearance Completely Able Use T oilet Completely Able Bath / Shower Completely Able Dress Self Completely Able Feed Self Completely Able Walk Completely Able Get In / Out Bed Completely Able Housework Completely Able Prepare Meals Completely Able Handle Money Completely Able Shop for Self Completely Able Electronic Signature(s) Signed: 08/15/2021 4:46:37 PM By: Samuella Bruin Entered By: Samuella Bruin on 08/15/2021 14:15:35 -------------------------------------------------------------------------------- Education Screening Details Patient Name: Date of Service: Cheryl [name] 08/15/2021 1:30 PM Medical Record Number: [id number] Patient Account Number: [id number] Date of Birth/Sex: Treating RN: [birth year] (66 y.o. Cheryl [name] Primary Care Cranston Koors: Barbie Banner Other Clinician: Referring Visente Kirker: Treating Jetty Berland/Extender: Stevan Born NHO PE, [place] THA RINE Weeks in Treatment: 0 Primary Learner Assessed: Patient Learning Preferences/Education Level/Primary Language Learning Preference: Explanation, Demonstration, Video, Printed Material Highest Education Level: High School Preferred Language: English Cognitive Barrier Language Barrier: No Translator Needed: No Memory Deficit: No Emotional Barrier: No Cultural/Religious Beliefs Affecting Medical Care: No Physical Barrier Impaired Vision: No Impaired Hearing: No Decreased Hand dexterity: No Knowledge/Comprehension Knowledge Level: Medium Comprehension Level: Medium Ability to understand written instructions: Medium Ability to understand verbal instructions: Medium Motivation Anxiety Level: Calm Cooperation: Cooperative Education Importance: Acknowledges Need Interest in Health Problems: Asks Questions Perception: Coherent Willingness to Engage in Self-Management Medium Activities: Readiness to Engage in Self-Management Medium Activities: Electronic Signature(s) Signed: 08/15/2021 4:46:37 PM By: Samuella Bruin Entered By: Samuella Bruin on 08/15/2021 14:16:08 -------------------------------------------------------------------------------- Fall Risk Assessment Details Patient Name: Date of Service: Cheryl [name]. 08/15/2021 1:30 PM Medical Record Number: [id number] Patient Account Number: [id number] Date of Birth/Sex: Treating RN: 1955-04-09 (66  y.o. Cheryl [name] Primary Care Anden Bartolo: Barbie Banner Other Clinician: Referring Conor Filsaime: Treating Adilee Lemme/Extender: Stevan Born NHO PE, [place] THA RINE Weeks in Treatment: 0 Fall Risk Assessment Items Have you had 2 or more falls in the last 12 monthso 0 No Have you had any fall that resulted in injury in the last 12 monthso 0 No FALLS RISK SCREEN History of falling - immediate or within 3 months 0 No Secondary diagnosis (Do you have 2 or more medical diagnoseso) 15 Yes Ambulatory aid None/bed rest/wheelchair/nurse 0 No Crutches/cane/walker 0 No Furniture 0 No Intravenous therapy Access/Saline/Heparin Lock 0 No  Gait/Transferring Normal/ bed rest/ wheelchair 0 No Weak (short steps with or without shuffle, stooped but able to lift head while walking, may seek 10 Yes support from furniture) Impaired (short steps with shuffle, may have difficulty arising from chair, head down, impaired 0 No balance) Mental Status Oriented to own ability 0 No Electronic Signature(s) Signed: 08/15/2021 4:46:37 PM By: Samuella Bruin Entered By: Samuella Bruin on 08/15/2021 14:16:26 -------------------------------------------------------------------------------- Foot Assessment Details Patient Name: Date of Service: Cheryl, [name] 08/15/2021 1:30 PM Medical Record Number: [id number] Patient Account Number: [id number] Date of Birth/Sex: Treating RN: 02-12-1955 (66 y.o. Cheryl [name] Primary Care Takasha Vetere: Barbie Banner Other Clinician: Referring Erminio Nygard: Treating Drianna Chandran/Extender: Stevan Born NHO PE, [place] THA RINE Weeks in Treatment: 0 Foot Assessment Items Site Locations + = Sensation present, - = Sensation absent, C = Callus, U = Ulcer R = Redness, W = Warmth, M = Maceration, PU = Pre-ulcerative lesion F = Fissure, S = Swelling, D = Dryness Assessment Right: Left: Other Deformity: No No Prior Foot Ulcer: No No Prior Amputation: No No Charcot  Joint: No No Ambulatory Status: Ambulatory Without Help Gait: Steady Electronic Signature(s) Signed: 08/15/2021 4:46:37 PM By: Samuella Bruin Entered By: Samuella Bruin on 08/15/2021 14:19:22 -------------------------------------------------------------------------------- Nutrition Risk Screening Details Patient Name: Date of Service: BLISS, BEHNKE 08/15/2021 1:30 PM Medical Record Number: [id number] Patient Account Number: [id number] Date of Birth/Sex: Treating RN: 27-Mar-1955 (66 y.o. Cheryl [name] Primary Care Onesty Clair: Barbie Banner Other Clinician: Referring Jaelan Rasheed: Treating Devereaux Grayson/Extender: Stevan Born NHO PE, [place] THA RINE Weeks in Treatment: 0 Height (in): 67 Weight (lbs): 270 Body Mass Index (BMI): 42.3 Nutrition Risk Screening Items Score Screening NUTRITION RISK SCREEN: I have an illness or condition that made me change the kind and/or amount of food I eat 0 No I eat fewer than two meals per day 0 No I eat few fruits and vegetables, or milk products 0 No I have three or more drinks of beer, liquor or wine almost every day 0 No I have tooth or mouth problems that make it hard for me to eat 0 No I don't always have enough money to buy the food I need 0 No I eat alone most of the time 0 No I take three or more different prescribed or over-the-counter drugs a day 1 Yes Without wanting to, I have lost or gained 10 pounds in the last six months 0 No I am not always physically able to shop, cook and/or feed myself 0 No Nutrition Protocols Good Risk Protocol 0 No interventions needed Moderate Risk Protocol High Risk Proctocol Risk Level: Good Risk Score: 1 Electronic Signature(s) Signed: 08/15/2021 4:46:37 PM By: Samuella Bruin Entered By: Samuella Bruin on 08/15/2021 14:16:55

## 2021-08-23 ENCOUNTER — Encounter (HOSPITAL_BASED_OUTPATIENT_CLINIC_OR_DEPARTMENT_OTHER): Payer: BC Managed Care – PPO | Admitting: General Surgery

## 2021-08-23 DIAGNOSIS — L97322 Non-pressure chronic ulcer of left ankle with fat layer exposed: Secondary | ICD-10-CM | POA: Diagnosis not present

## 2021-08-25 ENCOUNTER — Ambulatory Visit
Admission: RE | Admit: 2021-08-25 | Discharge: 2021-08-25 | Disposition: A | Discharge status: Home / Self Care | Payer: BC Managed Care – PPO | Source: Ambulatory Visit | Attending: Family Medicine | Admitting: Family Medicine

## 2021-08-25 DIAGNOSIS — Z1231 Encounter for screening mammogram for malignant neoplasm of breast: Secondary | ICD-10-CM

## 2021-08-28 NOTE — Progress Notes (Signed)
Cheryl [name], Cheryl [name] ([id number]) Visit Report for 08/23/2021 Arrival Information Details Patient Name: Date of Service: Cheryl [name], Cheryl [name] 08/23/2021 3:15 PM Medical Record Number: [id number] Patient Account Number: [id number] Date of Birth/Sex: Treating RN: [birth year] (66 y.o. Cheryl [name] Primary Care Khrystian Schauf: Christain [place] Other Clinician: Referring Modine Oppenheimer: Treating Glendon Dunwoody/Extender: Percival Spanish Weeks in Treatment: 1 Visit Information History Since Last Visit Added or deleted any medications: No Patient Arrived: Ambulatory Any new allergies or adverse reactions: No Arrival Time: 15:26 Had a fall or experienced change in No Accompanied By: self activities of daily living that may affect Transfer Assistance: None risk of falls: Patient Identification Verified: Yes Signs or symptoms of abuse/neglect since last visito No Secondary Verification Process Completed: Yes Hospitalized since last visit: No Patient Requires Transmission-Based Precautions: No Implantable device outside of the clinic excluding No Patient Has Alerts: No cellular tissue based products placed in the center since last visit: Has Dressing in Place as Prescribed: Yes Pain Present Now: Yes Electronic Signature(s) Signed: 08/23/2021 4:31:08 PM By: Sharyn Creamer RN, BSN Entered By: Sharyn Creamer on 08/23/2021 15:27:53 -------------------------------------------------------------------------------- Encounter Discharge Information Details Patient Name: Date of Service: Cheryl [name] 08/23/2021 3:15 PM Medical Record Number: [id number] Patient Account Number: [id number] Date of Birth/Sex: Treating RN: 06-17-1955 (66 y.o. Cheryl [name] Primary Care Jahdai Padovano: Christain [place] Other Clinician: Referring Masin Shatto: Treating Lynsey Ange/Extender: Percival Spanish Weeks in Treatment: 1 Encounter Discharge Information Items Post Procedure Vitals Discharge Condition:  Stable Temperature (F): 98.3 Ambulatory Status: Ambulatory Pulse (bpm): 82 Discharge Destination: Home Respiratory Rate (breaths/min): 18 Transportation: Private Auto Blood Pressure (mmHg): 152/82 Accompanied By: self Schedule Follow-up Appointment: Yes Clinical Summary of Care: Patient Declined Electronic Signature(s) Signed: 08/23/2021 4:31:08 PM By: Sharyn Creamer RN, BSN Entered By: Sharyn Creamer on 08/23/2021 16:28:21 -------------------------------------------------------------------------------- Lower Extremity Assessment Details Patient Name: Date of Service: Cheryl [name], Cheryl [name] 08/23/2021 3:15 PM Medical Record Number: [id number] Patient Account Number: [id number] Date of Birth/Sex: Treating RN: 03-13-1955 (66 y.o. Cheryl [name] Primary Care Mikeala Girdler: Christain [place] Other Clinician: Referring Braylin Xu: Treating Charolett Yarrow/Extender: Percival Spanish Weeks in Treatment: 1 Edema Assessment Assessed: [Left: No] [Right: No] E[Left: dema] [Right: :] Calf Left: Right: Point of Measurement: From Medial Instep 46 cm Ankle Left: Right: Point of Measurement: From Medial Instep 23 cm Vascular Assessment Pulses: Dorsalis Pedis Palpable: [Left:Yes] Electronic Signature(s) Signed: 08/23/2021 4:31:08 PM By: Sharyn Creamer RN, BSN Entered By: Sharyn Creamer on 08/23/2021 15:30:52 -------------------------------------------------------------------------------- Multi Wound Chart Details Patient Name: Date of Service: Cheryl [name] 08/23/2021 3:15 PM Medical Record Number: [id number] Patient Account Number: [id number] Date of Birth/Sex: Treating RN: 1955-10-02 (66 y.o. Cheryl [name] Primary Care Samarth Ogle: Christain [place] Other Clinician: Referring Suheyb Raucci: Treating London Tarnowski/Extender: Percival Spanish Weeks in Treatment: 1 Vital Signs Height(in): 67 Pulse(bpm): 23 Weight(lbs): 270 Blood Pressure(mmHg): 152/82 Body Mass Index(BMI):  42.3 Temperature(F): 98.3 Respiratory Rate(breaths/min): 18 Photos: [1:Left, Medial Foot] [N/A:N/A N/A] Wound Location: [1:Blister] [N/A:N/A] Wounding Event: [1:T be determined o] [N/A:N/A] Primary Etiology: [1:Anemia, Lymphedema, Hypertension, N/A] Comorbid History: [1:Osteoarthritis 08/01/2021] [N/A:N/A] Date Acquired: [1:1] [N/A:N/A] Weeks of Treatment: [1:Open] [N/A:N/A] Wound Status: [1:No] [N/A:N/A] Wound Recurrence: [1:0.6x0.6x0.1] [N/A:N/A] Measurements L x W x D (cm) [1:0.283] [N/A:N/A] A (cm) : rea [1:0.028] [N/A:N/A] Volume (cm) : [1:97.30%] [N/A:N/A] % Reduction in A [1:rea: 97.30%] [N/A:N/A] % Reduction in Volume: [1:Full Thickness Without Exposed] [N/A:N/A] Classification: [1:Support Structures Medium] [N/A:N/A] Exudate A mount: [1:Serosanguineous] [N/A:N/A] Exudate Type: [1:red, [name]] [N/A:N/A] Exudate  Color: [1:Distinct, outline attached] [N/A:N/A] Wound Margin: [1:Small (1-33%)] [N/A:N/A] Granulation A mount: [1:Red] [N/A:N/A] Granulation Quality: [1:Large (67-100%)] [N/A:N/A] Necrotic A mount: [1:Fat Layer (Subcutaneous Tissue): Yes N/A] Exposed Structures: [1:Fascia: No Tendon: No Muscle: No Joint: No Bone: No Large (67-100%)] [N/A:N/A] Epithelialization: [1:Debridement - Selective/Open Wound N/A] Debridement: Pre-procedure Verification/Time Out 15:36 [N/A:N/A] Taken: [1:Lidocaine 4% Topical Solution] [N/A:N/A] Pain Control: [1:Slough] [N/A:N/A] Tissue Debrided: [1:Non-Viable Tissue] [N/A:N/A] Level: [1:0.36] [N/A:N/A] Debridement A (sq cm): [1:rea Curette] [N/A:N/A] Instrument: [1:Minimum] [N/A:N/A] Bleeding: [1:Pressure] [N/A:N/A] Hemostasis A chieved: [1:0] [N/A:N/A] Procedural Pain: [1:0] [N/A:N/A] Post Procedural Pain: [1:Procedure was tolerated well] [N/A:N/A] Debridement Treatment Response: [1:0.6x0.6x0.1] [N/A:N/A] Post Debridement Measurements L x W x D (cm) [1:0.028] [N/A:N/A] Post Debridement Volume: (cm) [1:Debridement]  [N/A:N/A] Treatment Notes Electronic Signature(s) Signed: 08/23/2021 3:45:12 PM By: Fredirick Maudlin MD FACS Signed: 08/28/2021 12:55:32 PM By: Dellie Catholic RN Entered By: Fredirick Maudlin on 08/23/2021 15:45:11 -------------------------------------------------------------------------------- Multi-Disciplinary Care Plan Details Patient Name: Date of Service: Cheryl [name], Cheryl [name] 08/23/2021 3:15 PM Medical Record Number: [id number] Patient Account Number: [id number] Date of Birth/Sex: Treating RN: 12/24/1955 (66 y.o. Cheryl [name] Primary Care Kashana Breach: Christain [place] Other Clinician: Referring Chavie Kolinski: Treating Aryahna Spagna/Extender: Percival Spanish Weeks in Treatment: 1 Active Inactive Wound/Skin Impairment Nursing Diagnoses: Impaired tissue integrity Knowledge deficit related to ulceration/compromised skin integrity Goals: Patient/caregiver will verbalize understanding of skin care regimen Date Initiated: 08/15/2021 Target Resolution Date: 09/22/2021 Goal Status: Active Ulcer/skin breakdown will have a volume reduction of 30% by week 4 Date Initiated: 08/15/2021 Target Resolution Date: 09/22/2021 Goal Status: Active Interventions: Assess patient/caregiver ability to obtain necessary supplies Assess patient/caregiver ability to perform ulcer/skin care regimen upon admission and as needed Assess ulceration(s) every visit Treatment Activities: Skin care regimen initiated : 08/15/2021 Topical wound management initiated : 08/15/2021 Notes: Electronic Signature(s) Signed: 08/23/2021 4:31:08 PM By: Sharyn Creamer RN, BSN Entered By: Sharyn Creamer on 08/23/2021 15:34:42 -------------------------------------------------------------------------------- Pain Assessment Details Patient Name: Date of Service: Cheryl [name], Cheryl [name] 08/23/2021 3:15 PM Medical Record Number: [id number] Patient Account Number: [id number] Date of Birth/Sex: Treating RN: Jul 04, 1955 (66 y.o. Cheryl [name] Primary Care Yuritzy Zehring: Christain [place] Other Clinician: Referring Bern Fare: Treating Muskaan Smet/Extender: Percival Spanish Weeks in Treatment: 1 Active Problems Location of Pain Severity and Description of Pain Patient Has Paino Yes Site Locations Rate the pain. Current Pain Level: 2 Character of Pain Describe the Pain: Burning Pain Management and Medication Current Pain Management: Medication: Yes Rest: Yes Electronic Signature(s) Signed: 08/23/2021 4:31:08 PM By: Sharyn Creamer RN, BSN Entered By: Sharyn Creamer on 08/23/2021 15:28:20 -------------------------------------------------------------------------------- Patient/Caregiver Education Details Patient Name: Date of Service: Cheryl [name] 7/19/2023andnbsp3:15 PM Medical Record Number: [id number] Patient Account Number: [id number] Date of Birth/Gender: Treating RN: 10/29/1955 (66 y.o. Cheryl [name] Primary Care Physician: Christain [place] Other Clinician: Referring Physician: Treating Physician/Extender: Ronney Asters in Treatment: 1 Education Assessment Education Provided To: Patient Education Topics Provided Wound/Skin Impairment: Methods: Explain/Verbal Responses: State content correctly Electronic Signature(s) Signed: 08/23/2021 4:31:08 PM By: Sharyn Creamer RN, BSN Entered By: Sharyn Creamer on 08/23/2021 15:34:58 -------------------------------------------------------------------------------- Wound Assessment Details Patient Name: Date of Service: Cheryl [name], Cheryl [name] 08/23/2021 3:15 PM Medical Record Number: [id number] Patient Account Number: [id number] Date of Birth/Sex: Treating RN: Jun 16, 1955 (66 y.o. Cheryl [name] Primary Care Adaleena Mooers: Christain [place] Other Clinician: Referring Mahreen Schewe: Treating Eurydice Calixto/Extender: Percival Spanish Weeks in Treatment: 1 Wound Status Wound Number: 1 Primary Etiology: T be  determined o Wound Location: Left,  Medial Foot Wound Status: Open Wounding Event: Blister Comorbid History: Anemia, Lymphedema, Hypertension, Osteoarthritis Date Acquired: 08/01/2021 Weeks Of Treatment: 1 Clustered Wound: No Photos Wound Measurements Length: (cm) 0.6 Width: (cm) 0.6 Depth: (cm) 0.1 Area: (cm) 0.283 Volume: (cm) 0.028 % Reduction in Area: 97.3% % Reduction in Volume: 97.3% Epithelialization: Large (67-100%) Tunneling: No Undermining: No Wound Description Classification: Full Thickness Without Exposed Support Structures Wound Margin: Distinct, outline attached Exudate Amount: Medium Exudate Type: Serosanguineous Exudate Color: red, [name] Foul Odor After Cleansing: No Slough/Fibrino Yes Wound Bed Granulation Amount: Small (1-33%) Exposed Structure Granulation Quality: Red Fascia Exposed: No Necrotic Amount: Large (67-100%) Fat Layer (Subcutaneous Tissue) Exposed: Yes Necrotic Quality: Adherent Slough Tendon Exposed: No Muscle Exposed: No Joint Exposed: No Bone Exposed: No Treatment Notes Wound #1 (Foot) Wound Laterality: Left, Medial Cleanser Soap and Water Discharge Instruction: May shower and wash wound with dial antibacterial soap and water prior to dressing change. Wound Cleanser Discharge Instruction: Cleanse the wound with wound cleanser prior to applying a clean dressing using gauze sponges, not tissue or cotton balls. [facility] Ancillary Kit - 15 Day Supply Discharge Instruction: Use supplies as instructed; Kit contains: (15) Saline Bullets; (15) 3x3 Gauze; 15 pr Gloves Peri-Wound Care Topical Gentamicin Discharge Instruction: As directed by physician Primary Dressing KerraCel Ag Gelling Fiber Dressing, 2x2 in (silver alginate) Discharge Instruction: Apply silver alginate to wound bed as instructed Secondary Dressing ALLEVYN Gentle Border, 5x5 (in/in) Discharge Instruction: Apply over primary dressing as directed. Secured With Compression  Wrap Compression Stockings Environmental education officer) Signed: 08/23/2021 4:31:08 PM By: Sharyn Creamer RN, BSN Entered By: Sharyn Creamer on 08/23/2021 15:33:25 -------------------------------------------------------------------------------- [place] Details Patient Name: Date of Service: Cheryl [name] 08/23/2021 3:15 PM Medical Record Number: [id number] Patient Account Number: [id number] Date of Birth/Sex: Treating RN: [birth year] (66 y.o. Cheryl [name] Primary Care Mykelle Cockerell: Christain [place] Other Clinician: Referring Granite Godman: Treating Jaina Morin/Extender: Percival Spanish Weeks in Treatment: 1 Vital Signs Time Taken: 15:25 Temperature (F): 98.3 Height (in): 67 Pulse (bpm): 82 Weight (lbs): 270 Respiratory Rate (breaths/min): 18 Body Mass Index (BMI): 42.3 Blood Pressure (mmHg): 152/82 Reference Range: 80 - 120 mg / dl Electronic Signature(s) Signed: 08/23/2021 4:31:08 PM By: Sharyn Creamer RN, BSN Entered By: Sharyn Creamer on 08/23/2021 15:26:23

## 2021-08-28 NOTE — Progress Notes (Signed)
[name], [name] ([id number]) Visit Report for 08/23/2021 Chief Complaint Document Details Patient Name: Date of Service: Cheryl [name], Cheryl [name] 08/23/2021 3:15 PM Medical Record Number: [id number] Patient Account Number: [id number] Date of Birth/Sex: Treating RN: 08/13/1955 (66 y.o. Cheryl [name] Primary Care Provider: Christain [place] Other Clinician: Referring Provider: Treating Provider/Extender: Percival Spanish Weeks in Treatment: 1 Information Obtained from: Patient Chief Complaint Patient presents for treatment of an open ulcer due to venous insufficiency Electronic Signature(s) Signed: 08/23/2021 3:45:17 PM By: Fredirick Maudlin MD FACS Entered By: Fredirick Maudlin on 08/23/2021 15:45:17 -------------------------------------------------------------------------------- Debridement Details Patient Name: Date of Service: Cheryl [name] 08/23/2021 3:15 PM Medical Record Number: [id number] Patient Account Number: [id number] Date of Birth/Sex: Treating RN: [birth year] (66 y.o. Cheryl [name] Primary Care Provider: Christain [place] Other Clinician: Referring Provider: Treating Provider/Extender: Percival Spanish Weeks in Treatment: 1 Debridement Performed for Assessment: Wound #1 Left,Medial Foot Performed By: Physician Fredirick Maudlin, MD Debridement Type: Debridement Level of Consciousness (Pre-procedure): Awake and Alert Pre-procedure Verification/Time Out Yes - 15:36 Taken: Start Time: 15:39 Pain Control: Lidocaine 4% T opical Solution T Area Debrided (L x W): otal 0.6 (cm) x 0.6 (cm) = 0.36 (cm) Tissue and other material debrided: Non-Viable, Slough, Slough Level: Non-Viable Tissue Debridement Description: Selective/Open Wound Instrument: Curette Bleeding: Minimum Hemostasis Achieved: Pressure Procedural Pain: 0 Post Procedural Pain: 0 Response to Treatment: Procedure was tolerated well Level of Consciousness (Post- Awake and  Alert procedure): Post Debridement Measurements of Total Wound Length: (cm) 0.6 Width: (cm) 0.6 Depth: (cm) 0.1 Volume: (cm) 0.028 Character of Wound/Ulcer Post Debridement: Improved Post Procedure Diagnosis Same as Pre-procedure Electronic Signature(s) Signed: 08/23/2021 3:54:50 PM By: Fredirick Maudlin MD FACS Signed: 08/23/2021 4:31:08 PM By: Sharyn Creamer RN, BSN Entered By: Sharyn Creamer on 08/23/2021 15:40:22 -------------------------------------------------------------------------------- HPI Details Patient Name: Date of Service: Cheryl [name] 08/23/2021 3:15 PM Medical Record Number: [id number] Patient Account Number: [id number] Date of Birth/Sex: Treating RN: [birth year] (66 y.o. Cheryl [name] Primary Care Provider: Christain [place] Other Clinician: Referring Provider: Treating Provider/Extender: Percival Spanish Weeks in Treatment: 1 History of Present Illness HPI Description: ADMISSION 08/15/2021 This is a 66 year old non-smoker, nondiabetic, with a past medical history significant for significant venous insufficiency and reflux. She has previously undergone bilateral saphenous vein ablations. She wears compression stockings on a regular basis. About 2 weeks ago, she developed a blister on her left medial ankle. She thought perhaps it had rubbed on her shoes. As it became more painful and red, she was seen in the emergency department. She was diagnosed with cellulitis and prescribed doxycycline. She was referred to the wound care center for further evaluation and management. She has a geographic cluster of small open wounds on her right medial ankle, just below the malleolus. There is no erythema or induration associated with the wounds. No purulent drainage or odor. They are fairly sensitive. 08/23/2021: The cluster of wounds is smaller today. They are more superficial and have just a little bit of accumulated slough in each of the tiny  pockmarks. Electronic Signature(s) Signed: 08/23/2021 3:45:47 PM By: Fredirick Maudlin MD FACS Entered By: Fredirick Maudlin on 08/23/2021 15:45:47 -------------------------------------------------------------------------------- Physical Exam Details Patient Name: Date of Service: Cheryl [name], Cheryl [name] 08/23/2021 3:15 PM Medical Record Number: [id number] Patient Account Number: [id number] Date of Birth/Sex: Treating RN: 10-Nov-1955 (66 y.o. Cheryl [name] Primary Care Provider: Christain [place] Other Clinician: Referring Provider: Treating Provider/Extender: Percival Spanish Weeks in  Treatment: 1 Constitutional Slightly hypertensive. . . . No acute distress.Marland Kitchen Respiratory Normal work of breathing on room air.. Notes 08/23/2021: The cluster of wounds is smaller today. They are more superficial and have just a little bit of accumulated slough in each of the tiny pockmarks. Electronic Signature(s) Signed: 08/23/2021 3:46:22 PM By: Fredirick Maudlin MD FACS Entered By: Fredirick Maudlin on 08/23/2021 15:46:22 -------------------------------------------------------------------------------- Physician Orders Details Patient Name: Date of Service: Cheryl, PASZKIEWICZ 08/23/2021 3:15 PM Medical Record Number: [id number] Patient Account Number: [id number] Date of Birth/Sex: Treating RN: 1955-07-05 (66 y.o. Cheryl [name] Primary Care Provider: Christain Sacramento Other Clinician: Referring Provider: Treating Provider/Extender: Percival Spanish Weeks in Treatment: 1 Verbal / Phone Orders: No Diagnosis Coding ICD-10 Coding Code Description [phone] Non-pressure chronic ulcer of left ankle with fat layer exposed I87.2 Venous insufficiency (chronic) (peripheral) Y56.389 Varicose veins of bilateral lower extremities with other complications Follow-up Appointments ppointment in 1 week. - Dr. Celine Ahr - Room 3 - Return A Bathing/ Shower/ Hygiene May shower and wash wound with  soap and water. - with dressing changes Edema Control - Lymphedema / SCD / Other Elevate legs to the level of the heart or above for 30 minutes daily and/or when sitting, a frequency of: Avoid standing for long periods of time. Patient to wear own compression stockings every day. Wound Treatment Wound #1 - Foot Wound Laterality: Left, Medial Cleanser: Soap and Water 1 x Per HTD/42 Days Discharge Instructions: May shower and wash wound with dial antibacterial soap and water prior to dressing change. Cleanser: Wound Cleanser 1 x Per Day/15 Days Discharge Instructions: Cleanse the wound with wound cleanser prior to applying a clean dressing using gauze sponges, not tissue or cotton balls. Cleanser: Byram Ancillary Kit - 15 Day Supply (Generic) 1 x Per Day/15 Days Discharge Instructions: Use supplies as instructed; Kit contains: (15) Saline Bullets; (15) 3x3 Gauze; 15 pr Gloves Topical: Gentamicin 1 x Per Day/15 Days Discharge Instructions: As directed by physician Prim Dressing: KerraCel Ag Gelling Fiber Dressing, 2x2 in (silver alginate) (Generic) 1 x Per Day/15 Days ary Discharge Instructions: Apply silver alginate to wound bed as instructed Secondary Dressing: ALLEVYN Gentle Border, 5x5 (in/in) (Generic) 1 x Per Day/15 Days Discharge Instructions: Apply over primary dressing as directed. Electronic Signature(s) Signed: 08/23/2021 3:47:31 PM By: Fredirick Maudlin MD FACS Entered By: Fredirick Maudlin on 08/23/2021 15:47:31 -------------------------------------------------------------------------------- Problem List Details Patient Name: Date of Service: Cheryl [name], Cheryl [name] 08/23/2021 3:15 PM Medical Record Number: [id number] Patient Account Number: [id number] Date of Birth/Sex: Treating RN: 10-26-1955 (66 y.o. Cheryl [name] Primary Care Provider: Christain [place] Other Clinician: Referring Provider: Treating Provider/Extender: Percival Spanish Weeks in Treatment:  1 Active Problems ICD-10 Encounter Code Description Active Date MDM Diagnosis [phone] Non-pressure chronic ulcer of left ankle with fat layer exposed 08/15/2021 No Yes I87.2 Venous insufficiency (chronic) (peripheral) 08/15/2021 No Yes I83.893 Varicose veins of bilateral lower extremities with other complications 9/74/1638 No Yes Inactive Problems Resolved Problems Electronic Signature(s) Signed: 08/23/2021 3:43:33 PM By: Fredirick Maudlin MD FACS Entered By: Fredirick Maudlin on 08/23/2021 15:43:33 -------------------------------------------------------------------------------- Progress Note Details Patient Name: Date of Service: Cheryl [name] 08/23/2021 3:15 PM Medical Record Number: [id number] Patient Account Number: [id number] Date of Birth/Sex: Treating RN: 1955/10/13 (66 y.o. Cheryl [name] Primary Care Provider: Christain [place] Other Clinician: Referring Provider: Treating Provider/Extender: Percival Spanish Weeks in Treatment: 1 Subjective Chief Complaint Information obtained from Patient Patient presents for treatment of an  open ulcer due to venous insufficiency History of Present Illness (HPI) ADMISSION 08/15/2021 This is a 66 year old non-smoker, nondiabetic, with a past medical history significant for significant venous insufficiency and reflux. She has previously undergone bilateral saphenous vein ablations. She wears compression stockings on a regular basis. About 2 weeks ago, she developed a blister on her left medial ankle. She thought perhaps it had rubbed on her shoes. As it became more painful and red, she was seen in the emergency department. She was diagnosed with cellulitis and prescribed doxycycline. She was referred to the wound care center for further evaluation and management. She has a geographic cluster of small open wounds on her right medial ankle, just below the malleolus. There is no erythema or induration associated with the wounds.  No purulent drainage or odor. They are fairly sensitive. 08/23/2021: The cluster of wounds is smaller today. They are more superficial and have just a little bit of accumulated slough in each of the tiny pockmarks. Patient History Information obtained from Patient. Family History Cancer - Mother,Siblings, Heart Disease - Father, Hypertension - Father, Stroke - Siblings, No family history of Diabetes, Hereditary Spherocytosis, Kidney Disease, Lung Disease, Seizures, Thyroid Problems, Tuberculosis. Social History Never smoker, Marital Status - Divorced, Alcohol Use - Never, Drug Use - No History, Caffeine Use - Daily. Medical History Hematologic/Lymphatic Patient has history of Anemia, Lymphedema Cardiovascular Patient has history of Hypertension Musculoskeletal Patient has history of Osteoarthritis Hospitalization/Surgery History - endovenous ablation saphenous vein with laser. - breast biopsy. - cholecystectomy. - skin graft. Medical A Surgical History Notes nd Constitutional Symptoms (General Health) obesity Hematologic/Lymphatic Grave's disease Cardiovascular varicose veins, pericarditis Objective Constitutional Slightly hypertensive. No acute distress.. Vitals Time Taken: 3:25 PM, Height: 67 in, Weight: 270 lbs, BMI: 42.3, Temperature: 98.3 F, Pulse: 82 bpm, Respiratory Rate: 18 breaths/min, Blood Pressure: 152/82 mmHg. Respiratory Normal work of breathing on room air.. General Notes: 08/23/2021: The cluster of wounds is smaller today. They are more superficial and have just a little bit of accumulated slough in each of the tiny pockmarks. Integumentary (Hair, Skin) Wound #1 status is Open. Original cause of wound was Blister. The date acquired was: 08/01/2021. The wound has been in treatment 1 weeks. The wound is located on the Left,Medial Foot. The wound measures 0.6cm length x 0.6cm width x 0.1cm depth; 0.283cm^2 area and 0.028cm^3 volume. There is Fat Layer (Subcutaneous  Tissue) exposed. There is no tunneling or undermining noted. There is a medium amount of serosanguineous drainage noted. The wound margin is distinct with the outline attached to the wound base. There is small (1-33%) red granulation within the wound bed. There is a large (67-100%) amount of necrotic tissue within the wound bed including Adherent Slough. Assessment Active Problems ICD-10 Non-pressure chronic ulcer of left ankle with fat layer exposed Venous insufficiency (chronic) (peripheral) Varicose veins of bilateral lower extremities with other complications Procedures Wound #1 Pre-procedure diagnosis of Wound #1 is a T be determined located on the Left,Medial Foot . There was a Selective/Open Wound Non-Viable Tissue o Debridement with a total area of 0.36 sq cm performed by Fredirick Maudlin, MD. With the following instrument(s): Curette to remove Non-Viable tissue/material. Material removed includes [facility] after achieving pain control using Lidocaine 4% Topical Solution. No specimens were taken. A time out was conducted at 15:36, prior to the start of the procedure. A Minimum amount of bleeding was controlled with Pressure. The procedure was tolerated well with a pain level of 0 throughout and a pain level  of 0 following the procedure. Post Debridement Measurements: 0.6cm length x 0.6cm width x 0.1cm depth; 0.028cm^3 volume. Character of Wound/Ulcer Post Debridement is improved. Post procedure Diagnosis Wound #1: Same as Pre-Procedure Plan Follow-up Appointments: Return Appointment in 1 week. - Dr. Celine Ahr - Room 3 - Bathing/ Shower/ Hygiene: May shower and wash wound with soap and water. - with dressing changes Edema Control - Lymphedema / SCD / Other: Elevate legs to the level of the heart or above for 30 minutes daily and/or when sitting, a frequency of: Avoid standing for long periods of time. Patient to wear own compression stockings every day. WOUND #1: - Foot Wound  Laterality: Left, Medial Cleanser: Soap and Water 1 x Per IBB/04 Days Discharge Instructions: May shower and wash wound with dial antibacterial soap and water prior to dressing change. Cleanser: Wound Cleanser 1 x Per Day/15 Days Discharge Instructions: Cleanse the wound with wound cleanser prior to applying a clean dressing using gauze sponges, not tissue or cotton balls. Cleanser: Byram Ancillary Kit - 15 Day Supply (Generic) 1 x Per Day/15 Days Discharge Instructions: Use supplies as instructed; Kit contains: (15) Saline Bullets; (15) 3x3 Gauze; 15 pr Gloves Topical: Gentamicin 1 x Per Day/15 Days Discharge Instructions: As directed by physician Prim Dressing: KerraCel Ag Gelling Fiber Dressing, 2x2 in (silver alginate) (Generic) 1 x Per Day/15 Days ary Discharge Instructions: Apply silver alginate to wound bed as instructed Secondary Dressing: ALLEVYN Gentle Border, 5x5 (in/in) (Generic) 1 x Per Day/15 Days Discharge Instructions: Apply over primary dressing as directed. 08/23/2021: The cluster of wounds is smaller today. They are more superficial and have just a little bit of accumulated slough in each of the tiny pockmarks. I used a curette to debride the slough from the wound. We will continue using topical gentamicin with silver alginate. She asked for some additional padding so we will place a foam border dressing over this and she will continue to wear her personal compression stockings. She will follow-up in 1 week. Electronic Signature(s) Signed: 08/23/2021 3:48:09 PM By: Fredirick Maudlin MD FACS Entered By: Fredirick Maudlin on 08/23/2021 15:48:09 -------------------------------------------------------------------------------- HxROS Details Patient Name: Date of Service: Cheryl [name], PICK 08/23/2021 3:15 PM Medical Record Number: [id number] Patient Account Number: [id number] Date of Birth/Sex: Treating RN: December 14, 1955 (66 y.o. Cheryl [name] Primary Care Provider: Christain [place] Other Clinician: Referring Provider: Treating Provider/Extender: Percival Spanish Weeks in Treatment: 1 Information Obtained From Patient Constitutional Symptoms (General Health) Medical History: Past Medical History Notes: obesity Hematologic/Lymphatic Medical History: Positive for: Anemia; Lymphedema Past Medical History Notes: Grave's disease Cardiovascular Medical History: Positive for: Hypertension Past Medical History Notes: varicose veins, pericarditis Musculoskeletal Medical History: Positive for: Osteoarthritis Immunizations Pneumococcal Vaccine: Received Pneumococcal Vaccination: Yes Received Pneumococcal Vaccination On or After 60th Birthday: Yes Implantable Devices None Hospitalization / Surgery History Type of Hospitalization/Surgery endovenous ablation saphenous vein with laser breast biopsy cholecystectomy skin graft Family and Social History Cancer: Yes - Mother,Siblings; Diabetes: No; Heart Disease: Yes - Father; Hereditary Spherocytosis: No; Hypertension: Yes - Father; Kidney Disease: No; Lung Disease: No; Seizures: No; Stroke: Yes - Siblings; Thyroid Problems: No; Tuberculosis: No; Never smoker; Marital Status - Divorced; Alcohol Use: Never; Drug Use: No History; Caffeine Use: Daily; Financial Concerns: No; Food, Clothing or Shelter Needs: No; Support System Lacking: No; Transportation Concerns: No Engineer, maintenance) Signed: 08/23/2021 3:54:50 PM By: Fredirick Maudlin MD FACS Signed: 08/28/2021 12:55:32 PM By: Dellie Catholic RN Entered By: Fredirick Maudlin on 08/23/2021 15:45:57 -------------------------------------------------------------------------------- SuperBill  Details Patient Name: Date of Service: NAILANI, Cheryl [name] 08/23/2021 Medical Record Number: [id number] Patient Account Number: [id number] Date of Birth/Sex: Treating RN: 07/02/1955 (66 y.o. Cheryl [name] Primary Care Provider: Christain [place] Other  Clinician: Referring Provider: Treating Provider/Extender: Percival Spanish Weeks in Treatment: 1 Diagnosis Coding ICD-10 Codes Code Description [phone] Non-pressure chronic ulcer of left ankle with fat layer exposed I87.2 Venous insufficiency (chronic) (peripheral) I83.893 Varicose veins of bilateral lower extremities with other complications Facility Procedures CPT4 Code: [id number] Description: 72761 - DEBRIDE WOUND 1ST 20 SQ CM OR < ICD-10 Diagnosis Description L97.322 Non-pressure chronic ulcer of left ankle with fat layer exposed Modifier: Quantity: 1 Physician Procedures : CPT4 Code Description Modifier [id number] - WC PHYS LEVEL 4 - EST PT 25 ICD-10 Diagnosis Description L97.322 Non-pressure chronic ulcer of left ankle with fat layer exposed I87.2 Venous insufficiency (chronic) (peripheral) G39.432 Varicose veins of  bilateral lower extremities with other complications Quantity: 1 : [id number] - WC PHYS DEBR WO ANESTH 20 SQ CM ICD-10 Diagnosis Description V22.241 Non-pressure chronic ulcer of left ankle with fat layer exposed Quantity: 1 Electronic Signature(s) Signed: 08/23/2021 3:48:26 PM By: Fredirick Maudlin MD FACS Entered By: Fredirick Maudlin on 08/23/2021 15:48:26

## 2021-09-01 ENCOUNTER — Encounter (HOSPITAL_BASED_OUTPATIENT_CLINIC_OR_DEPARTMENT_OTHER): Payer: BC Managed Care – PPO | Admitting: General Surgery

## 2021-09-01 DIAGNOSIS — L97322 Non-pressure chronic ulcer of left ankle with fat layer exposed: Secondary | ICD-10-CM | POA: Diagnosis not present

## 2021-09-01 NOTE — Progress Notes (Signed)
Cheryl [name], Cheryl [name] ([id number]) Visit Report for 09/01/2021 Chief Complaint Document Details Patient Name: Date of Service: Cheryl [name], Cheryl [name] 09/01/2021 3:45 PM Medical Record Number: [id number] Patient Account Number: [id number] Date of Birth/Sex: Treating RN: 05/30/1955 (66 y.o. [name] Primary Care Provider: Christain [place] Other Clinician: Referring Provider: Treating Provider/Extender: Percival Spanish Weeks in Treatment: 2 Information Obtained from: Patient Chief Complaint Patient presents for treatment of an open ulcer due to venous insufficiency Electronic Signature(s) Signed: 09/01/2021 3:41:34 PM By: Fredirick Maudlin MD FACS Entered By: Fredirick Maudlin on 09/01/2021 15:41:34 -------------------------------------------------------------------------------- Debridement Details Patient Name: Date of Service: Cheryl [name] 09/01/2021 3:45 PM Medical Record Number: [id number] Patient Account Number: [id number] Date of Birth/Sex: Treating RN: 1955/10/12 (66 y.o. [name] Primary Care Provider: Christain [place] Other Clinician: Referring Provider: Treating Provider/Extender: Percival Spanish Weeks in Treatment: 2 Debridement Performed for Assessment: Wound #1 Left,Medial Foot Performed By: Physician Fredirick Maudlin, MD Debridement Type: Debridement Severity of Tissue Pre Debridement: Fat layer exposed Level of Consciousness (Pre-procedure): Awake and Alert Pre-procedure Verification/Time Out Yes - 15:28 Taken: Start Time: 15:28 Pain Control: Lidocaine 4% T opical Solution T Area Debrided (L x W): otal 0.5 (cm) x 0.5 (cm) = 0.25 (cm) Tissue and other material debrided: Non-Viable, Eschar, Slough, Slough Level: Non-Viable Tissue Debridement Description: Selective/Open Wound Instrument: Curette Bleeding: Minimum Hemostasis Achieved: Pressure Procedural Pain: 5 Post Procedural Pain: 1 Response to Treatment: Procedure was  tolerated well Level of Consciousness (Post- Awake and Alert procedure): Post Debridement Measurements of Total Wound Length: (cm) 0.5 Width: (cm) 0.5 Depth: (cm) 0.1 Volume: (cm) 0.02 Character of Wound/Ulcer Post Debridement: Improved Severity of Tissue Post Debridement: Fat layer exposed Post Procedure Diagnosis Same as Pre-procedure Electronic Signature(s) Signed: 09/01/2021 4:01:31 PM By: Fredirick Maudlin MD FACS Signed: 09/01/2021 4:47:37 PM By: Adline Peals Entered By: Adline Peals on 09/01/2021 15:29:14 -------------------------------------------------------------------------------- HPI Details Patient Name: Date of Service: Cheryl [name] 09/01/2021 3:45 PM Medical Record Number: [id number] Patient Account Number: [id number] Date of Birth/Sex: Treating RN: 02/17/1955 (66 y.o. [name] Primary Care Provider: Christain [place] Other Clinician: Referring Provider: Treating Provider/Extender: Percival Spanish Weeks in Treatment: 2 History of Present Illness HPI Description: ADMISSION 08/15/2021 This is a 66 year old non-smoker, nondiabetic, with a past medical history significant for significant venous insufficiency and reflux. She has previously undergone bilateral saphenous vein ablations. She wears compression stockings on a regular basis. About 2 weeks ago, she developed a blister on her left medial ankle. She thought perhaps it had rubbed on her shoes. As it became more painful and red, she was seen in the emergency department. She was diagnosed with cellulitis and prescribed doxycycline. She was referred to the wound care center for further evaluation and management. She has a geographic cluster of small open wounds on her right medial ankle, just below the malleolus. There is no erythema or induration associated with the wounds. No purulent drainage or odor. They are fairly sensitive. 08/23/2021: The cluster of wounds is smaller  today. They are more superficial and have just a little bit of accumulated slough in each of the tiny pockmarks. 09/01/2021: The wounds continue to contract. Pain is less than on previous visits. There is light slough accumulation in each of the open pockmark wounds. Electronic Signature(s) Signed: 09/01/2021 3:42:11 PM By: Fredirick Maudlin MD FACS Entered By: Fredirick Maudlin on 09/01/2021 15:42:11 -------------------------------------------------------------------------------- Physical Exam Details Patient Name: Date of Service: Cheryl Blew B. 09/01/2021  3:45 PM Medical Record Number: [id number] Patient Account Number: [id number] Date of Birth/Sex: Treating RN: 02-02-1956 (66 y.o. [name] Primary Care Provider: Christain [place] Other Clinician: Referring Provider: Treating Provider/Extender: Percival Spanish Weeks in Treatment: 2 Constitutional Hypertensive, asymptomatic. . . . No acute distress.Marland Kitchen Respiratory Normal work of breathing on room air.. Notes 09/01/2021: The wounds continue to contract. Pain is less than on previous visits. There is light slough accumulation in each of the open pockmark wounds. Electronic Signature(s) Signed: 09/01/2021 3:42:38 PM By: Fredirick Maudlin MD FACS Entered By: Fredirick Maudlin on 09/01/2021 15:42:38 -------------------------------------------------------------------------------- Physician Orders Details Patient Name: Date of Service: Cheryl [name], Cheryl [name] 09/01/2021 3:45 PM Medical Record Number: [id number] Patient Account Number: [id number] Date of Birth/Sex: Treating RN: 05/27/1955 (66 y.o. [name] Primary Care Provider: Christain Sacramento Other Clinician: Referring Provider: Treating Provider/Extender: Percival Spanish Weeks in Treatment: 2 Verbal / Phone Orders: No Diagnosis Coding ICD-10 Coding Code Description [id number] Non-pressure chronic ulcer of left ankle with fat layer  exposed I87.2 Venous insufficiency (chronic) (peripheral) A56.979 Varicose veins of bilateral lower extremities with other complications Follow-up Appointments ppointment in 2 weeks. - Dr. Celine Ahr - room 2 - 8/10 at 3:00 PM Return A Bathing/ Shower/ Hygiene May shower and wash wound with soap and water. - with dressing changes Edema Control - Lymphedema / SCD / Other Elevate legs to the level of the heart or above for 30 minutes daily and/or when sitting, a frequency of: Avoid standing for long periods of time. Patient to wear own compression stockings every day. Wound Treatment Wound #1 - Foot Wound Laterality: Left, Medial Cleanser: Soap and Water 1 x Per YIA/16 Days Discharge Instructions: May shower and wash wound with dial antibacterial soap and water prior to dressing change. Cleanser: Wound Cleanser 1 x Per Day/15 Days Discharge Instructions: Cleanse the wound with wound cleanser prior to applying a clean dressing using gauze sponges, not tissue or cotton balls. Cleanser: Byram Ancillary Kit - 15 Day Supply (Generic) 1 x Per Day/15 Days Discharge Instructions: Use supplies as instructed; Kit contains: (15) Saline Bullets; (15) 3x3 Gauze; 15 pr Gloves Topical: Gentamicin 1 x Per Day/15 Days Discharge Instructions: As directed by physician Prim Dressing: KerraCel Ag Gelling Fiber Dressing, 2x2 in (silver alginate) (Generic) 1 x Per Day/15 Days ary Discharge Instructions: Apply silver alginate to wound bed as instructed Secondary Dressing: ALLEVYN Gentle Border, 5x5 (in/in) (Generic) 1 x Per Day/15 Days Discharge Instructions: Apply over primary dressing as directed. Electronic Signature(s) Signed: 09/01/2021 4:01:31 PM By: Fredirick Maudlin MD FACS Entered By: Fredirick Maudlin on 09/01/2021 15:42:51 -------------------------------------------------------------------------------- Problem List Details Patient Name: Date of Service: Cheryl [name], Cheryl [name] 09/01/2021 3:45 PM Medical Record  Number: [id number] Patient Account Number: [id number] Date of Birth/Sex: Treating RN: [birth year] (66 y.o. [name] Primary Care Provider: Christain Sacramento Other Clinician: Referring Provider: Treating Provider/Extender: Percival Spanish Weeks in Treatment: 2 Active Problems ICD-10 Encounter Code Description Active Date MDM Diagnosis [phone] Non-pressure chronic ulcer of left ankle with fat layer exposed 08/15/2021 No Yes I87.2 Venous insufficiency (chronic) (peripheral) 08/15/2021 No Yes I83.893 Varicose veins of bilateral lower extremities with other complications 4/92/0100 No Yes Inactive Problems Resolved Problems Electronic Signature(s) Signed: 09/01/2021 3:39:57 PM By: Fredirick Maudlin MD FACS Entered By: Fredirick Maudlin on 09/01/2021 15:39:57 -------------------------------------------------------------------------------- Progress Note Details Patient Name: Date of Service: Cheryl [name] 09/01/2021 3:45 PM Medical Record Number: [id number] Patient Account Number: [id number] Date of Birth/Sex:  Treating RN: 1955-02-29 (66 y.o. [name] Primary Care Provider: Christain [place] Other Clinician: Referring Provider: Treating Provider/Extender: Percival Spanish Weeks in Treatment: 2 Subjective Chief Complaint Information obtained from Patient Patient presents for treatment of an open ulcer due to venous insufficiency History of Present Illness (HPI) ADMISSION 08/15/2021 This is a 66 year old non-smoker, nondiabetic, with a past medical history significant for significant venous insufficiency and reflux. She has previously undergone bilateral saphenous vein ablations. She wears compression stockings on a regular basis. About 2 weeks ago, she developed a blister on her left medial ankle. She thought perhaps it had rubbed on her shoes. As it became more painful and red, she was seen in the emergency department. She  was diagnosed with cellulitis and prescribed doxycycline. She was referred to the wound care center for further evaluation and management. She has a geographic cluster of small open wounds on her right medial ankle, just below the malleolus. There is no erythema or induration associated with the wounds. No purulent drainage or odor. They are fairly sensitive. 08/23/2021: The cluster of wounds is smaller today. They are more superficial and have just a little bit of accumulated slough in each of the tiny pockmarks. 09/01/2021: The wounds continue to contract. Pain is less than on previous visits. There is light slough accumulation in each of the open pockmark wounds. Patient History Information obtained from Patient. Family History Cancer - Mother,Siblings, Heart Disease - Father, Hypertension - Father, Stroke - Siblings, No family history of Diabetes, Hereditary Spherocytosis, Kidney Disease, Lung Disease, Seizures, Thyroid Problems, Tuberculosis. Social History Never smoker, Marital Status - Divorced, Alcohol Use - Never, Drug Use - No History, Caffeine Use - Daily. Medical History Hematologic/Lymphatic Patient has history of Anemia, Lymphedema Cardiovascular Patient has history of Hypertension Musculoskeletal Patient has history of Osteoarthritis Hospitalization/Surgery History - endovenous ablation saphenous vein with laser. - breast biopsy. - cholecystectomy. - skin graft. Medical A Surgical History Notes nd Constitutional Symptoms (General Health) obesity Hematologic/Lymphatic Grave's disease Cardiovascular varicose veins, pericarditis Objective Constitutional Hypertensive, asymptomatic. No acute distress.. Vitals Time Taken: 3:16 PM, Height: 67 in, Weight: 270 lbs, BMI: 42.3, Temperature: 98.2 F, Pulse: 75 bpm, Respiratory Rate: 18 breaths/min, Blood Pressure: 157/85 mmHg. Respiratory Normal work of breathing on room air.. General Notes: 09/01/2021: The wounds continue to  contract. Pain is less than on previous visits. There is light slough accumulation in each of the open pockmark wounds. Integumentary (Hair, Skin) Wound #1 status is Open. Original cause of wound was Blister. The date acquired was: 08/01/2021. The wound has been in treatment 2 weeks. The wound is located on the Left,Medial Foot. The wound measures 0.5cm length x 0.5cm width x 0.1cm depth; 0.196cm^2 area and 0.02cm^3 volume. There is Fat Layer (Subcutaneous Tissue) exposed. There is no tunneling or undermining noted. There is a medium amount of serosanguineous drainage noted. The wound margin is distinct with the outline attached to the wound base. There is small (1-33%) red granulation within the wound bed. There is a large (67-100%) amount of necrotic tissue within the wound bed including Adherent Slough. Assessment Active Problems ICD-10 Non-pressure chronic ulcer of left ankle with fat layer exposed Venous insufficiency (chronic) (peripheral) Varicose veins of bilateral lower extremities with other complications Procedures Wound #1 Pre-procedure diagnosis of Wound #1 is a Venous Leg Ulcer located on the Left,Medial Foot .Severity of Tissue Pre Debridement is: Fat layer exposed. There was a Selective/Open Wound Non-Viable Tissue Debridement with a total area of 0.25  sq cm performed by Fredirick Maudlin, MD. With the following instrument(s): Curette to remove Non-Viable tissue/material. Material removed includes Eschar and Slough and after achieving pain control using Lidocaine 4% Topical Solution. No specimens were taken. A time out was conducted at 15:28, prior to the start of the procedure. A Minimum amount of bleeding was controlled with Pressure. The procedure was tolerated well with a pain level of 5 throughout and a pain level of 1 following the procedure. Post Debridement Measurements: 0.5cm length x 0.5cm width x 0.1cm depth; 0.02cm^3 volume. Character of Wound/Ulcer Post Debridement is  improved. Severity of Tissue Post Debridement is: Fat layer exposed. Post procedure Diagnosis Wound #1: Same as Pre-Procedure Plan Follow-up Appointments: Return Appointment in 2 weeks. - Dr. Celine Ahr - room 2 - 8/10 at 3:00 PM Bathing/ Shower/ Hygiene: May shower and wash wound with soap and water. - with dressing changes Edema Control - Lymphedema / SCD / Other: Elevate legs to the level of the heart or above for 30 minutes daily and/or when sitting, a frequency of: Avoid standing for long periods of time. Patient to wear own compression stockings every day. WOUND #1: - Foot Wound Laterality: Left, Medial Cleanser: Soap and Water 1 x Per HXT/05 Days Discharge Instructions: May shower and wash wound with dial antibacterial soap and water prior to dressing change. Cleanser: Wound Cleanser 1 x Per Day/15 Days Discharge Instructions: Cleanse the wound with wound cleanser prior to applying a clean dressing using gauze sponges, not tissue or cotton balls. Cleanser: Byram Ancillary Kit - 15 Day Supply (Generic) 1 x Per Day/15 Days Discharge Instructions: Use supplies as instructed; Kit contains: (15) Saline Bullets; (15) 3x3 Gauze; 15 pr Gloves Topical: Gentamicin 1 x Per Day/15 Days Discharge Instructions: As directed by physician Prim Dressing: KerraCel Ag Gelling Fiber Dressing, 2x2 in (silver alginate) (Generic) 1 x Per Day/15 Days ary Discharge Instructions: Apply silver alginate to wound bed as instructed Secondary Dressing: ALLEVYN Gentle Border, 5x5 (in/in) (Generic) 1 x Per Day/15 Days Discharge Instructions: Apply over primary dressing as directed. 09/01/2021: The wounds continue to contract. Pain is less than on previous visits. There is light slough accumulation in each of the open pockmark wounds. I used a curette to debride the slough from each of the wounds. We will continue using silver alginate and a foam border. She has been diligent in wearing her compression stockings  bilaterally. She will follow-up in 2 weeks. Electronic Signature(s) Signed: 09/01/2021 3:43:24 PM By: Fredirick Maudlin MD FACS Entered By: Fredirick Maudlin on 09/01/2021 15:43:24 -------------------------------------------------------------------------------- HxROS Details Patient Name: Date of Service: Cheryl [name], Cheryl [name] 09/01/2021 3:45 PM Medical Record Number: [id number] Patient Account Number: [id number] Date of Birth/Sex: Treating RN: 11-04-1955 (67 y.o. [name] Primary Care Provider: Christain [place] Other Clinician: Referring Provider: Treating Provider/Extender: Percival Spanish Weeks in Treatment: 2 Information Obtained From Patient Constitutional Symptoms (General Health) Medical History: Past Medical History Notes: obesity Hematologic/Lymphatic Medical History: Positive for: Anemia; Lymphedema Past Medical History Notes: Grave's disease Cardiovascular Medical History: Positive for: Hypertension Past Medical History Notes: varicose veins, pericarditis Musculoskeletal Medical History: Positive for: Osteoarthritis Immunizations Pneumococcal Vaccine: Received Pneumococcal Vaccination: Yes Received Pneumococcal Vaccination On or After 60th Birthday: Yes Implantable Devices None Hospitalization / Surgery History Type of Hospitalization/Surgery endovenous ablation saphenous vein with laser breast biopsy cholecystectomy skin graft Family and Social History Cancer: Yes - Mother,Siblings; Diabetes: No; Heart Disease: Yes - Father; Hereditary Spherocytosis: No; Hypertension: Yes - Father; Kidney Disease: No; Lung  Disease: No; Seizures: No; Stroke: Yes - Siblings; Thyroid Problems: No; Tuberculosis: No; Never smoker; Marital Status - Divorced; Alcohol Use: Never; Drug Use: No History; Caffeine Use: Daily; Financial Concerns: No; Food, Clothing or Shelter Needs: No; Support System Lacking: No; Transportation Concerns: No Electronic  Signature(s) Signed: 09/01/2021 4:01:31 PM By: Fredirick Maudlin MD FACS Signed: 09/01/2021 4:47:37 PM By: Sabas Sous By: Fredirick Maudlin on 09/01/2021 15:42:17 -------------------------------------------------------------------------------- SuperBill Details Patient Name: Date of Service: Cheryl [name] 09/01/2021 Medical Record Number: [id number] Patient Account Number: [id number] Date of Birth/Sex: Treating RN: 05-Nov-1955 (65 y.o. [name] Primary Care Provider: Christain [place] Other Clinician: Referring Provider: Treating Provider/Extender: Percival Spanish Weeks in Treatment: 2 Diagnosis Coding ICD-10 Codes Code Description [phone] Non-pressure chronic ulcer of left ankle with fat layer exposed I87.2 Venous insufficiency (chronic) (peripheral) I83.893 Varicose veins of bilateral lower extremities with other complications Facility Procedures CPT4 Code: [id number] Description: 27782 - DEBRIDE WOUND 1ST 20 SQ CM OR < ICD-10 Diagnosis Description L97.322 Non-pressure chronic ulcer of left ankle with fat layer exposed Modifier: Quantity: 1 Physician Procedures : CPT4 Code Description Modifier [id number] - WC PHYS LEVEL 3 - EST PT 25 ICD-10 Diagnosis Description L97.322 Non-pressure chronic ulcer of left ankle with fat layer exposed I87.2 Venous insufficiency (chronic) (peripheral) Q00.867 Varicose veins of  bilateral lower extremities with other complications Quantity: 1 : [id number] - WC PHYS DEBR WO ANESTH 20 SQ CM ICD-10 Diagnosis Description W58.099 Non-pressure chronic ulcer of left ankle with fat layer exposed Quantity: 1 Electronic Signature(s) Signed: 09/01/2021 3:43:38 PM By: Fredirick Maudlin MD FACS Entered By: Fredirick Maudlin on 09/01/2021 15:43:38

## 2021-09-01 NOTE — Progress Notes (Signed)
Cheryl, [name] ([id number]) Visit Report for 09/01/2021 Arrival Information Details Patient Name: Date of Service: Cheryl [name], Cheryl [name] 09/01/2021 3:45 PM Medical Record Number: [id number] Patient Account Number: [id number] Date of Birth/Sex: Treating RN: [birth year] (66 y.o. Donney Rankins, Lovena Le Primary Care Jerriah Ines: Christain [place] Other Clinician: Referring Braydon Kullman: Treating Tihanna Goodson/Extender: Percival Spanish Weeks in Treatment: 2 Visit Information History Since Last Visit Added or deleted any medications: No Patient Arrived: Ambulatory Any new allergies or adverse reactions: No Arrival Time: 15:14 Had a fall or experienced change in No Accompanied By: self activities of daily living that may affect Transfer Assistance: None risk of falls: Patient Identification Verified: Yes Signs or symptoms of abuse/neglect since last visito No Secondary Verification Process Completed: Yes Hospitalized since last visit: No Patient Requires Transmission-Based Precautions: No Implantable device outside of the clinic excluding No Patient Has Alerts: No cellular tissue based products placed in the center since last visit: Has Dressing in Place as Prescribed: Yes Pain Present Now: Yes Electronic Signature(s) Signed: 09/01/2021 4:47:37 PM By: Adline Peals Entered By: Adline Peals on 09/01/2021 15:16:44 -------------------------------------------------------------------------------- Encounter Discharge Information Details Patient Name: Date of Service: Cheryl [name] 09/01/2021 3:45 PM Medical Record Number: [id number] Patient Account Number: [id number] Date of Birth/Sex: Treating RN: February 17, 1955 (66 y.o. [name] Primary Care Titianna Loomis: Christain [place] Other Clinician: Referring Lawrence Roldan: Treating Harolyn Cocker/Extender: Percival Spanish Weeks in Treatment: 2 Encounter Discharge Information Items Post Procedure Vitals Discharge Condition:  Stable Temperature (F): 98.2 Ambulatory Status: Ambulatory Pulse (bpm): 75 Discharge Destination: Home Respiratory Rate (breaths/min): 18 Transportation: Private Auto Blood Pressure (mmHg): 157/85 Accompanied By: self Schedule Follow-up Appointment: Yes Clinical Summary of Care: Patient Declined Electronic Signature(s) Signed: 09/01/2021 4:47:37 PM By: Adline Peals Entered By: Adline Peals on 09/01/2021 15:48:30 -------------------------------------------------------------------------------- Lower Extremity Assessment Details Patient Name: Date of Service: Cheryl [name], Cheryl [name] 09/01/2021 3:45 PM Medical Record Number: [id number] Patient Account Number: [id number] Date of Birth/Sex: Treating RN: 1956-01-21 (66 y.o. [name] Primary Care Dola Lunsford: Christain [place] Other Clinician: Referring Lasean Gorniak: Treating Meryl Hubers/Extender: Percival Spanish Weeks in Treatment: 2 Edema Assessment Assessed: [Left: No] [Right: No] E[Left: dema] [Right: :] Calf Left: Right: Point of Measurement: From Medial Instep 48.2 cm Ankle Left: Right: Point of Measurement: From Medial Instep 24.4 cm Vascular Assessment Pulses: Dorsalis Pedis Palpable: [Left:Yes] Electronic Signature(s) Signed: 09/01/2021 4:47:37 PM By: Adline Peals Entered By: Adline Peals on 09/01/2021 15:19:52 -------------------------------------------------------------------------------- Multi Wound Chart Details Patient Name: Date of Service: Cheryl [name] 09/01/2021 3:45 PM Medical Record Number: [id number] Patient Account Number: [id number] Date of Birth/Sex: Treating RN: [birth year] (66 y.o. [name] Primary Care Mylia Pondexter: Christain [place] Other Clinician: Referring Silena Wyss: Treating Talli Kimmer/Extender: Percival Spanish Weeks in Treatment: 2 Vital Signs Height(in): 67 Pulse(bpm): 75 Weight(lbs): 270 Blood Pressure(mmHg): 157/85 Body Mass  Index(BMI): 42.3 Temperature(F): 98.2 Respiratory Rate(breaths/min): 18 Photos: [1:Left, Medial Foot] [N/A:N/A N/A] Wound Location: [1:Blister] [N/A:N/A] Wounding Event: [1:Venous Leg Ulcer] [N/A:N/A] Primary Etiology: [1:Anemia, Lymphedema, Hypertension, N/A] Comorbid History: [1:Osteoarthritis 08/01/2021] [N/A:N/A] Date Acquired: [1:2] [N/A:N/A] Weeks of Treatment: [1:Open] [N/A:N/A] Wound Status: [1:No] [N/A:N/A] Wound Recurrence: [1:0.5x0.5x0.1] [N/A:N/A] Measurements L x W x D (cm) [1:0.196] [N/A:N/A] A (cm) : rea [1:0.02] [N/A:N/A] Volume (cm) : [1:98.10%] [N/A:N/A] % Reduction in A [1:rea: 98.10%] [N/A:N/A] % Reduction in Volume: [1:Full Thickness Without Exposed] [N/A:N/A] Classification: [1:Support Structures Medium] [N/A:N/A] Exudate A mount: [1:Serosanguineous] [N/A:N/A] Exudate Type: [1:red, brown] [N/A:N/A] Exudate Color: [1:Distinct, outline attached] [N/A:N/A] Wound Margin: [  1:Small (1-33%)] [N/A:N/A] Granulation A mount: [1:Red] [N/A:N/A] Granulation Quality: [1:Large (67-100%)] [N/A:N/A] Necrotic A mount: [1:Fat Layer (Subcutaneous Tissue): Yes N/A] Exposed Structures: [1:Fascia: No Tendon: No Muscle: No Joint: No Bone: No Large (67-100%)] [N/A:N/A] Epithelialization: [1:Debridement - Selective/Open Wound N/A] Debridement: Pre-procedure Verification/Time Out 15:28 [N/A:N/A] Taken: [1:Lidocaine 4% Topical Solution] [N/A:N/A] Pain Control: [1:Necrotic/Eschar, Slough] [N/A:N/A] Tissue Debrided: [1:Non-Viable Tissue] [N/A:N/A] Level: [1:0.25] [N/A:N/A] Debridement A (sq cm): [1:rea Curette] [N/A:N/A] Instrument: [1:Minimum] [N/A:N/A] Bleeding: [1:Pressure] [N/A:N/A] Hemostasis A chieved: [1:5] [N/A:N/A] Procedural Pain: [1:1] [N/A:N/A] Post Procedural Pain: [1:Procedure was tolerated well] [N/A:N/A] Debridement Treatment Response: [1:0.5x0.5x0.1] [N/A:N/A] Post Debridement Measurements L x W x D (cm) [1:0.02] [N/A:N/A] Post Debridement Volume: (cm)  [1:Debridement] [N/A:N/A] Treatment Notes Electronic Signature(s) Signed: 09/01/2021 3:40:02 PM By: Fredirick Maudlin MD FACS Signed: 09/01/2021 4:47:37 PM By: Adline Peals Entered By: Fredirick Maudlin on 09/01/2021 15:40:02 -------------------------------------------------------------------------------- Multi-Disciplinary Care Plan Details Patient Name: Date of Service: Cheryl [name], Cheryl [name] 09/01/2021 3:45 PM Medical Record Number: [id number] Patient Account Number: [id number] Date of Birth/Sex: Treating RN: August 02, 1955 (65 y.o. [name] Primary Care Milia Warth: Christain [place] Other Clinician: Referring Shanekqua Schaper: Treating Blenda Wisecup/Extender: Percival Spanish Weeks in Treatment: 2 Active Inactive Wound/Skin Impairment Nursing Diagnoses: Impaired tissue integrity Knowledge deficit related to ulceration/compromised skin integrity Goals: Patient/caregiver will verbalize understanding of skin care regimen Date Initiated: 08/15/2021 Target Resolution Date: 09/22/2021 Goal Status: Active Ulcer/skin breakdown will have a volume reduction of 30% by week 4 Date Initiated: 08/15/2021 Target Resolution Date: 09/22/2021 Goal Status: Active Interventions: Assess patient/caregiver ability to obtain necessary supplies Assess patient/caregiver ability to perform ulcer/skin care regimen upon admission and as needed Assess ulceration(s) every visit Treatment Activities: Skin care regimen initiated : 08/15/2021 Topical wound management initiated : 08/15/2021 Notes: Electronic Signature(s) Signed: 09/01/2021 4:47:37 PM By: Adline Peals Entered By: Adline Peals on 09/01/2021 15:20:56 -------------------------------------------------------------------------------- Pain Assessment Details Patient Name: Date of Service: Cheryl [name], Cheryl [name] 09/01/2021 3:45 PM Medical Record Number: [id number] Patient Account Number: [id number] Date of Birth/Sex: Treating RN: 1955-02-24  (66 y.o. [name] Primary Care Gola Bribiesca: Christain [place] Other Clinician: Referring Gracielynn Birkel: Treating Jonetta Dagley/Extender: Percival Spanish Weeks in Treatment: 2 Active Problems Location of Pain Severity and Description of Pain Patient Has Paino Yes Site Locations Pain Location: Pain in Ulcers Duration of the Pain. Constant / Intermittento Intermittent Rate the pain. Current Pain Level: 2 Character of Pain Describe the Pain: Tender Pain Management and Medication Current Pain Management: Electronic Signature(s) Signed: 09/01/2021 4:47:37 PM By: Adline Peals Entered By: Adline Peals on 09/01/2021 15:17:29 -------------------------------------------------------------------------------- Patient/Caregiver Education Details Patient Name: Date of Service: Cheryl [name] 7/28/2023andnbsp3:45 PM Medical Record Number: [id number] Patient Account Number: [id number] Date of Birth/Gender: Treating RN: 08-Dec-1955 (66 y.o. [name] Primary Care Physician: Christain [place] Other Clinician: Referring Physician: Treating Physician/Extender: Ronney Asters in Treatment: 2 Education Assessment Education Provided To: Patient Education Topics Provided Wound/Skin Impairment: Methods: Explain/Verbal Responses: Reinforcements needed, State content correctly Electronic Signature(s) Signed: 09/01/2021 4:47:37 PM By: Adline Peals Entered By: Adline Peals on 09/01/2021 15:21:13 -------------------------------------------------------------------------------- Wound Assessment Details Patient Name: Date of Service: Cheryl [name], Cheryl [name] 09/01/2021 3:45 PM Medical Record Number: [id number] Patient Account Number: [id number] Date of Birth/Sex: Treating RN: [birth year] (66 y.o. [name] Primary Care Handy Mcloud: Christain [place] Other Clinician: Referring Isaic Syler: Treating Larrissa Stivers/Extender: Percival Spanish Weeks in Treatment: 2 Wound Status Wound Number: 1 Primary Etiology: Venous Leg Ulcer Wound Location: Left, Medial Foot Wound  Status: Open Wounding Event: Blister Comorbid History: Anemia, Lymphedema, Hypertension, Osteoarthritis Date Acquired: 08/01/2021 Weeks Of Treatment: 2 Clustered Wound: No Photos Wound Measurements Length: (cm) 0.5 Width: (cm) 0.5 Depth: (cm) 0.1 Area: (cm) 0.196 Volume: (cm) 0.02 % Reduction in Area: 98.1% % Reduction in Volume: 98.1% Epithelialization: Large (67-100%) Tunneling: No Undermining: No Wound Description Classification: Full Thickness Without Exposed Support Structures Wound Margin: Distinct, outline attached Exudate Amount: Medium Exudate Type: Serosanguineous Exudate Color: red, brown Foul Odor After Cleansing: No Slough/Fibrino Yes Wound Bed Granulation Amount: Small (1-33%) Exposed Structure Granulation Quality: Red Fascia Exposed: No Necrotic Amount: Large (67-100%) Fat Layer (Subcutaneous Tissue) Exposed: Yes Necrotic Quality: Adherent Slough Tendon Exposed: No Muscle Exposed: No Joint Exposed: No Bone Exposed: No Treatment Notes Wound #1 (Foot) Wound Laterality: Left, Medial Cleanser Soap and Water Discharge Instruction: May shower and wash wound with dial antibacterial soap and water prior to dressing change. Wound Cleanser Discharge Instruction: Cleanse the wound with wound cleanser prior to applying a clean dressing using gauze sponges, not tissue or cotton balls. [facility] Ancillary Kit - 15 Day Supply Discharge Instruction: Use supplies as instructed; Kit contains: (15) Saline Bullets; (15) 3x3 Gauze; 15 pr Gloves Peri-Wound Care Topical Gentamicin Discharge Instruction: As directed by physician Primary Dressing KerraCel Ag Gelling Fiber Dressing, 2x2 in (silver alginate) Discharge Instruction: Apply silver alginate to wound bed as instructed Secondary Dressing ALLEVYN Gentle Border, 5x5  (in/in) Discharge Instruction: Apply over primary dressing as directed. Secured With Compression Wrap Compression Stockings Environmental education officer) Signed: 09/01/2021 4:47:37 PM By: Adline Peals Entered By: Adline Peals on 09/01/2021 15:26:06 -------------------------------------------------------------------------------- Vitals Details Patient Name: Date of Service: Cheryl [name] 09/01/2021 3:45 PM Medical Record Number: [id number] Patient Account Number: [id number] Date of Birth/Sex: Treating RN: Nov 16, 1955 (66 y.o. [name] Primary Care Ambriella Kitt: Other Clinician: Christain [place] Referring Duwayne Matters: Treating Lyndon Chenoweth/Extender: Percival Spanish Weeks in Treatment: 2 Vital Signs Time Taken: 15:16 Temperature (F): 98.2 Height (in): 67 Pulse (bpm): 75 Weight (lbs): 270 Respiratory Rate (breaths/min): 18 Body Mass Index (BMI): 42.3 Blood Pressure (mmHg): 157/85 Reference Range: 80 - 120 mg / dl Electronic Signature(s) Signed: 09/01/2021 4:47:37 PM By: Adline Peals Entered By: Adline Peals on 09/01/2021 15:17:02

## 2021-09-14 ENCOUNTER — Encounter (HOSPITAL_BASED_OUTPATIENT_CLINIC_OR_DEPARTMENT_OTHER): Payer: BC Managed Care – PPO | Attending: General Surgery | Admitting: General Surgery

## 2021-09-14 DIAGNOSIS — I872 Venous insufficiency (chronic) (peripheral): Secondary | ICD-10-CM | POA: Diagnosis not present

## 2021-09-14 DIAGNOSIS — L97322 Non-pressure chronic ulcer of left ankle with fat layer exposed: Secondary | ICD-10-CM | POA: Insufficient documentation

## 2021-09-14 DIAGNOSIS — M1990 Unspecified osteoarthritis, unspecified site: Secondary | ICD-10-CM | POA: Insufficient documentation

## 2021-09-14 DIAGNOSIS — I10 Essential (primary) hypertension: Secondary | ICD-10-CM | POA: Diagnosis not present

## 2021-09-14 NOTE — Progress Notes (Addendum)
Cheryl, [name] ([id number]) Visit Report for 09/14/2021 Arrival Information Details Patient Name: Date of Service: Cheryl, [name] 09/14/2021 3:00 PM Medical Record Number: [id number] Patient Account Number: [id number] Date of Birth/Sex: Treating RN: May 18, 1955 (66 y.o. Cheryl [name] Primary Care Mareon Robinette: Christain [place] Other Clinician: Referring Hershal Eriksson: Treating Zaela Graley/Extender: Percival Spanish Weeks in Treatment: 4 Visit Information History Since Last Visit Added or deleted any medications: No Patient Arrived: Ambulatory Any new allergies or adverse reactions: No Arrival Time: 15:40 Had a fall or experienced change in No Accompanied By: self activities of daily living that may affect Transfer Assistance: None risk of falls: Patient Identification Verified: Yes Signs or symptoms of abuse/neglect since last visito No Secondary Verification Process Completed: Yes Hospitalized since last visit: No Patient Requires Transmission-Based Precautions: No Implantable device outside of the clinic excluding No Patient Has Alerts: No cellular tissue based products placed in the center since last visit: Has Dressing in Place as Prescribed: Yes Has Compression in Place as Prescribed: Yes Pain Present Now: Yes Electronic Signature(s) Signed: 09/14/2021 4:41:51 PM By: Sharyn Creamer RN, BSN Entered By: Sharyn Creamer on 09/14/2021 15:41:12 -------------------------------------------------------------------------------- Encounter Discharge Information Details Patient Name: Date of Service: Cheryl [name]. 09/14/2021 3:00 PM Medical Record Number: [id number] Patient Account Number: [id number] Date of Birth/Sex: Treating RN: 02-11-1955 (66 y.o. Cheryl [name] Primary Care Cheryl [name]: Christain [place] Other Clinician: Referring Cheryl [name]: Treating Cheryl [name]/Extender: Percival Spanish Weeks in Treatment: 4 Encounter Discharge Information Items Post  Procedure Vitals Discharge Condition: Stable Temperature (F): 97.6 Ambulatory Status: Ambulatory Pulse (bpm): 73 Discharge Destination: Home Respiratory Rate (breaths/min): 18 Transportation: Private Auto Blood Pressure (mmHg): 144/84 Accompanied By: self Schedule Follow-up Appointment: Yes Clinical Summary of Care: Patient Declined Electronic Signature(s) Signed: 09/14/2021 4:41:51 PM By: Sharyn Creamer RN, BSN Entered By: Sharyn Creamer on 09/14/2021 16:38:41 -------------------------------------------------------------------------------- Lower Extremity Assessment Details Patient Name: Date of Service: Cheryl [name], Cheryl [name] 09/14/2021 3:00 PM Medical Record Number: [id number] Patient Account Number: [id number] Date of Birth/Sex: Treating RN: 24-May-1955 (66 y.o. Cheryl [name] Primary Care Loring Liskey: Christain [place] Other Clinician: Referring Amyia Lodwick: Treating Meril Dray/Extender: Percival Spanish Weeks in Treatment: 4 Edema Assessment Assessed: [Left: No] [Right: No] E[Left: dema] [Right: :] Calf Left: Right: Point of Measurement: From Medial Instep 46.1 cm Ankle Left: Right: Point of Measurement: From Medial Instep 23.6 cm Vascular Assessment Pulses: Dorsalis Pedis Palpable: [Left:Yes] Electronic Signature(s) Signed: 09/14/2021 4:41:51 PM By: Sharyn Creamer RN, BSN Entered By: Sharyn Creamer on 09/14/2021 15:40:17 -------------------------------------------------------------------------------- Multi Wound Chart Details Patient Name: Date of Service: Cheryl [name]. 09/14/2021 3:00 PM Medical Record Number: [id number] Patient Account Number: [id number] Date of Birth/Sex: Treating RN: Jan 09, 1956 (66 y.o. Cheryl [name] Primary Care Francis Doenges: Christain [place] Other Clinician: Referring Elizer Bostic: Treating Samanth Mirkin/Extender: Percival Spanish Weeks in Treatment: 4 Vital Signs Height(in): 67 Pulse(bpm): 73 Weight(lbs): 270 Blood  Pressure(mmHg): 144/84 Body Mass Index(BMI): 42.3 Temperature(F): 97.6 Respiratory Rate(breaths/min): 18 Photos: [N/A:N/A] Left, Medial Foot N/A N/A Wound Location: Blister N/A N/A Wounding Event: Venous Leg Ulcer N/A N/A Primary Etiology: Anemia, Lymphedema, Hypertension, N/A N/A Comorbid History: Osteoarthritis 08/01/2021 N/A N/A Date Acquired: 4 N/A N/A Weeks of Treatment: Open N/A N/A Wound Status: No N/A N/A Wound Recurrence: 0.2x0.2x0.1 N/A N/A Measurements L x W x D (cm) 0.031 N/A N/A A (cm) : rea 0.003 N/A N/A Volume (cm) : 99.70% N/A N/A % Reduction in A rea: 99.70% N/A N/A % Reduction in Volume: Full Thickness  Without Exposed N/A N/A Classification: Support Structures Medium N/A N/A Exudate A mount: Serosanguineous N/A N/A Exudate Type: red, brown N/A N/A Exudate Color: Distinct, outline attached N/A N/A Wound Margin: Small (1-33%) N/A N/A Granulation A mount: Red N/A N/A Granulation Quality: Large (67-100%) N/A N/A Necrotic A mount: Fat Layer (Subcutaneous Tissue): Yes N/A N/A Exposed Structures: Fascia: No Tendon: No Muscle: No Joint: No Bone: No Large (67-100%) N/A N/A Epithelialization: Debridement - Selective/Open Wound N/A N/A Debridement: Pre-procedure Verification/Time Out 16:05 N/A N/A Taken: Lidocaine 5% topical ointment N/A N/A Pain Control: Necrotic/Eschar, Slough N/A N/A Tissue Debrided: Non-Viable Tissue N/A N/A Level: 0.04 N/A N/A Debridement A (sq cm): rea Curette N/A N/A Instrument: Minimum N/A N/A Bleeding: Pressure N/A N/A Hemostasis A chieved: 0 N/A N/A Procedural Pain: 0 N/A N/A Post Procedural Pain: Procedure was tolerated well N/A N/A Debridement Treatment Response: 0.2x0.2x0.1 N/A N/A Post Debridement Measurements L x W x D (cm) 0.003 N/A N/A Post Debridement Volume: (cm) Debridement N/A N/A Procedures Performed: Treatment Notes Wound #1 (Foot) Wound Laterality: Left,  Medial Cleanser Soap and Water Discharge Instruction: May shower and wash wound with dial antibacterial soap and water prior to dressing change. Wound Cleanser Discharge Instruction: Cleanse the wound with wound cleanser prior to applying a clean dressing using gauze sponges, not tissue or cotton balls. Byram Ancillary Kit - 15 Day Supply Discharge Instruction: Use supplies as instructed; Kit contains: (15) Saline Bullets; (15) 3x3 Gauze; 15 pr Gloves Peri-Wound Care Topical Gentamicin Discharge Instruction: As directed by physician Primary Dressing KerraCel Ag Gelling Fiber Dressing, 2x2 in (silver alginate) Discharge Instruction: Apply silver alginate to wound bed as instructed Secondary Dressing Woven Gauze Sponge, Non-Sterile 4x4 in Discharge Instruction: Apply over primary dressing as directed. Secured With 77M Medipore H Soft Cloth Surgical T ape, 4 x 10 (in/yd) Discharge Instruction: Secure with tape as directed. Compression Wrap Compression Stockings Add-Ons Electronic Signature(s) Signed: 09/14/2021 4:44:12 PM By: Fredirick Maudlin MD FACS Signed: 09/15/2021 4:07:48 PM By: Sabas Sous By: Fredirick Maudlin on 09/14/2021 16:44:12 -------------------------------------------------------------------------------- Multi-Disciplinary Care Plan Details Patient Name: Date of Service: Cheryl [name], LABELLA 09/14/2021 3:00 PM Medical Record Number: [id number] Patient Account Number: [id number] Date of Birth/Sex: Treating RN: 1955/06/27 (66 y.o. Cheryl [name] Primary Care Jamyson Jirak: Christain [place] Other Clinician: Referring Disaya Walt: Treating Lacrystal Barbe/Extender: Percival Spanish Weeks in Treatment: 4 Active Inactive Wound/Skin Impairment Nursing Diagnoses: Impaired tissue integrity Knowledge deficit related to ulceration/compromised skin integrity Goals: Patient/caregiver will verbalize understanding of skin care regimen Date Initiated:  08/15/2021 Target Resolution Date: 09/22/2021 Goal Status: Active Ulcer/skin breakdown will have a volume reduction of 30% by week 4 Date Initiated: 08/15/2021 Target Resolution Date: 09/22/2021 Goal Status: Active Interventions: Assess patient/caregiver ability to obtain necessary supplies Assess patient/caregiver ability to perform ulcer/skin care regimen upon admission and as needed Assess ulceration(s) every visit Treatment Activities: Skin care regimen initiated : 08/15/2021 Topical wound management initiated : 08/15/2021 Notes: Electronic Signature(s) Signed: 09/14/2021 4:41:51 PM By: Sharyn Creamer RN, BSN Entered By: Sharyn Creamer on 09/14/2021 15:46:01 -------------------------------------------------------------------------------- Pain Assessment Details Patient Name: Date of Service: Cheryl [name], Cheryl [name] 09/14/2021 3:00 PM Medical Record Number: [id number] Patient Account Number: [id number] Date of Birth/Sex: Treating RN: 1955/02/24 (66 y.o. Cheryl [name] Primary Care Cheryl [name] Cheryl [name]: Christain [place] Other Clinician: Referring Jaesean Litzau: Treating Sabrine Patchen/Extender: Percival Spanish Weeks in Treatment: 4 Active Problems Location of Pain Severity and Description of Pain Patient Has Paino Yes Site Locations Rate the pain. Current Pain Level: 2  Character of Pain Describe the Pain: Burning Pain Management and Medication Current Pain Management: Electronic Signature(s) Signed: 09/14/2021 4:41:51 PM By: Sharyn Creamer RN, BSN Entered By: Sharyn Creamer on 09/14/2021 15:41:59 -------------------------------------------------------------------------------- Patient/Caregiver Education Details Patient Name: Date of Service: Cheryl [name] 8/10/2023andnbsp3:00 PM Medical Record Number: [id number] Patient Account Number: [id number] Date of Birth/Gender: Treating RN: [birth year] (66 y.o. Cheryl [name] Primary Care Physician: Christain [place] Other  Clinician: Referring Physician: Treating Physician/Extender: Ronney Asters in Treatment: 4 Education Assessment Education Provided To: Patient Education Topics Provided Wound/Skin Impairment: Methods: Explain/Verbal Responses: State content correctly Electronic Signature(s) Signed: 09/14/2021 4:41:51 PM By: Sharyn Creamer RN, BSN Entered By: Sharyn Creamer on 09/14/2021 15:46:19 -------------------------------------------------------------------------------- Wound Assessment Details Patient Name: Date of Service: Cheryl [name], Cheryl [name] 09/14/2021 3:00 PM Medical Record Number: [id number] Patient Account Number: [id number] Date of Birth/Sex: Treating RN: 06/02/1955 (66 y.o. Cheryl [name] Primary Care Elizabeth Haff: Christain [place] Other Clinician: Referring Dreux Mcgroarty: Treating Donelda Mailhot/Extender: Percival Spanish Weeks in Treatment: 4 Wound Status Wound Number: 1 Primary Etiology: Venous Leg Ulcer Wound Location: Left, Medial Foot Wound Status: Open Wounding Event: Blister Comorbid History: Anemia, Lymphedema, Hypertension, Osteoarthritis Date Acquired: 08/01/2021 Weeks Of Treatment: 4 Clustered Wound: No Photos Wound Measurements Length: (cm) 0.2 Width: (cm) 0.2 Depth: (cm) 0.1 Area: (cm) 0.031 Volume: (cm) 0.003 % Reduction in Area: 99.7% % Reduction in Volume: 99.7% Epithelialization: Large (67-100%) Tunneling: No Undermining: No Wound Description Classification: Full Thickness Without Exposed Support Structures Wound Margin: Distinct, outline attached Exudate Amount: Medium Exudate Type: Serosanguineous Exudate Color: red, brown Foul Odor After Cleansing: No Slough/Fibrino Yes Wound Bed Granulation Amount: Small (1-33%) Exposed Structure Granulation Quality: Red Fascia Exposed: No Necrotic Amount: Large (67-100%) Fat Layer (Subcutaneous Tissue) Exposed: Yes Necrotic Quality: Adherent Slough Tendon Exposed: No Muscle  Exposed: No Joint Exposed: No Bone Exposed: No Treatment Notes Wound #1 (Foot) Wound Laterality: Left, Medial Cleanser Soap and Water Discharge Instruction: May shower and wash wound with dial antibacterial soap and water prior to dressing change. Wound Cleanser Discharge Instruction: Cleanse the wound with wound cleanser prior to applying a clean dressing using gauze sponges, not tissue or cotton balls. Byram Ancillary Kit - 15 Day Supply Discharge Instruction: Use supplies as instructed; Kit contains: (15) Saline Bullets; (15) 3x3 Gauze; 15 pr Gloves Peri-Wound Care Topical Gentamicin Discharge Instruction: As directed by physician Primary Dressing KerraCel Ag Gelling Fiber Dressing, 2x2 in (silver alginate) Discharge Instruction: Apply silver alginate to wound bed as instructed Secondary Dressing Woven Gauze Sponge, Non-Sterile 4x4 in Discharge Instruction: Apply over primary dressing as directed. Secured With 67M Medipore H Soft Cloth Surgical T ape, 4 x 10 (in/yd) Discharge Instruction: Secure with tape as directed. Compression Wrap Compression Stockings Add-Ons Electronic Signature(s) Signed: 09/14/2021 4:41:51 PM By: Sharyn Creamer RN, BSN Entered By: Sharyn Creamer on 09/14/2021 15:45:06 -------------------------------------------------------------------------------- Vitals Details Patient Name: Date of Service: Cheryl [name]. 09/14/2021 3:00 PM Medical Record Number: [id number] Patient Account Number: [id number] Date of Birth/Sex: Treating RN: 1955-03-21 (66 y.o. Cheryl [name] Primary Care Jamyia Fortune: Christain [place] Other Clinician: Referring Ladonne Sharples: Treating Janna Oak/Extender: Percival Spanish Weeks in Treatment: 4 Vital Signs Time Taken: 15:35 Temperature (F): 97.6 Height (in): 67 Pulse (bpm): 73 Weight (lbs): 270 Respiratory Rate (breaths/min): 18 Body Mass Index (BMI): 42.3 Blood Pressure (mmHg): 144/84 Reference Range: 80 - 120 mg  / dl Electronic Signature(s) Signed: 09/14/2021 4:41:51 PM By: Sharyn Creamer RN, BSN Entered By: Sharyn Creamer on 09/14/2021  15:41:44 

## 2021-09-15 NOTE — Progress Notes (Signed)
MACIAH, FEEBACK ([id number]) Visit Report for 09/14/2021 Chief Complaint Document Details Patient Name: Date of Service: Cheryl [name], Cheryl [name] 09/14/2021 3:00 PM Medical Record Number: [id number] Patient Account Number: [id number] Date of Birth/Sex: Treating RN: Dec 02, 1955 (66 y.o. [name] Primary Care Provider: Christain [place] Other Clinician: Referring Provider: Treating Provider/Extender: Percival Spanish Weeks in Treatment: 4 Information Obtained from: Patient Chief Complaint Patient presents for treatment of an open ulcer due to venous insufficiency Electronic Signature(s) Signed: 09/14/2021 4:44:18 PM By: Fredirick Maudlin MD FACS Entered By: Fredirick Maudlin on 09/14/2021 16:44:18 -------------------------------------------------------------------------------- Debridement Details Patient Name: Date of Service: Cheryl [name], Cheryl [name] 09/14/2021 3:00 PM Medical Record Number: [id number] Patient Account Number: [id number] Date of Birth/Sex: Treating RN: August 05, 1955 (66 y.o. Donalda Ewings Primary Care Provider: Christain [place] Other Clinician: Referring Provider: Treating Provider/Extender: Percival Spanish Weeks in Treatment: 4 Debridement Performed for Assessment: Wound #1 Left,Medial Foot Performed By: Physician Fredirick Maudlin, MD Debridement Type: Debridement Severity of Tissue Pre Debridement: Fat layer exposed Level of Consciousness (Pre-procedure): Awake and Alert Pre-procedure Verification/Time Out Yes - 16:05 Taken: Start Time: 16:05 Pain Control: Lidocaine 5% topical ointment T Area Debrided (L x W): otal 0.2 (cm) x 0.2 (cm) = 0.04 (cm) Tissue and other material debrided: Non-Viable, Eschar, Slough, Slough Level: Non-Viable Tissue Debridement Description: Selective/Open Wound Instrument: Curette Bleeding: Minimum Hemostasis Achieved: Pressure Procedural Pain: 0 Post Procedural Pain: 0 Response to Treatment: Procedure was  tolerated well Level of Consciousness (Post- Awake and Alert procedure): Post Debridement Measurements of Total Wound Length: (cm) 0.2 Width: (cm) 0.2 Depth: (cm) 0.1 Volume: (cm) 0.003 Character of Wound/Ulcer Post Debridement: Improved Severity of Tissue Post Debridement: Fat layer exposed Post Procedure Diagnosis Same as Pre-procedure Electronic Signature(s) Signed: 09/14/2021 4:41:51 PM By: Sharyn Creamer RN, BSN Signed: 09/14/2021 4:47:38 PM By: Fredirick Maudlin MD FACS Entered By: Sharyn Creamer on 09/14/2021 16:08:13 -------------------------------------------------------------------------------- HPI Details Patient Name: Date of Service: Cheryl [name], Cheryl [name] 09/14/2021 3:00 PM Medical Record Number: [id number] Patient Account Number: [id number] Date of Birth/Sex: Treating RN: 04/16/1955 (66 y.o. [name] Primary Care Provider: Christain [place] Other Clinician: Referring Provider: Treating Provider/Extender: Percival Spanish Weeks in Treatment: 4 History of Present Illness HPI Description: ADMISSION 08/15/2021 This is a 66 year old non-smoker, nondiabetic, with a past medical history significant for significant venous insufficiency and reflux. She has previously undergone bilateral saphenous vein ablations. She wears compression stockings on a regular basis. About 2 weeks ago, she developed a blister on her left medial ankle. She thought perhaps it had rubbed on her shoes. As it became more painful and red, she was seen in the emergency department. She was diagnosed with cellulitis and prescribed doxycycline. She was referred to the wound care center for further evaluation and management. She has a geographic cluster of small open wounds on her right medial ankle, just below the malleolus. There is no erythema or induration associated with the wounds. No purulent drainage or odor. They are fairly sensitive. 08/23/2021: The cluster of wounds is smaller  today. They are more superficial and have just a little bit of accumulated slough in each of the tiny pockmarks. 09/01/2021: The wounds continue to contract. Pain is less than on previous visits. There is light slough accumulation in each of the open pockmark wounds. 09/14/2021: The majority of her wounds are closed. There are just a couple of areas remaining at the most proximal portion of the geographic cluster of wounds. There was some  eschar overlying these. It does look like her foot has been rubbing on something as the skin at the end of the wounded area near her toes looks a bit raw. It does not appear to be frankly open at this time. She says she feels like the foam border dressing slides around in her footwear. Electronic Signature(s) Signed: 09/14/2021 4:45:40 PM By: Fredirick Maudlin MD FACS Entered By: Fredirick Maudlin on 09/14/2021 16:45:40 -------------------------------------------------------------------------------- Physical Exam Details Patient Name: Date of Service: Cheryl [name], Cheryl [name] 09/14/2021 3:00 PM Medical Record Number: [id number] Patient Account Number: [id number] Date of Birth/Sex: Treating RN: [birth year] (66 y.o. [name] Primary Care Provider: Christain [place] Other Clinician: Referring Provider: Treating Provider/Extender: Percival Spanish Weeks in Treatment: 4 Constitutional Slightly hypertensive. . . . No acute distress.Marland Kitchen Respiratory Normal work of breathing on room air.. Notes 09/14/2021: The majority of her wounds are closed. There are just a couple of areas remaining at the most proximal portion of the geographic cluster of wounds. There was some eschar overlying these. It does look like her foot has been rubbing on something as the skin at the end of the wounded area near her toes looks a bit raw. It does not appear to be frankly open at this time Electronic Signature(s) Signed: 09/14/2021 4:46:11 PM By: Fredirick Maudlin MD  FACS Entered By: Fredirick Maudlin on 09/14/2021 16:46:11 -------------------------------------------------------------------------------- Physician Orders Details Patient Name: Date of Service: Cheryl [name], Cheryl [name] 09/14/2021 3:00 PM Medical Record Number: [id number] Patient Account Number: [id number] Date of Birth/Sex: Treating RN: 1956/01/22 (66 y.o. Donalda Ewings Primary Care Provider: Christain Sacramento Other Clinician: Referring Provider: Treating Provider/Extender: Percival Spanish Weeks in Treatment: 4 Verbal / Phone Orders: No Diagnosis Coding ICD-10 Coding Code Description [phone] Non-pressure chronic ulcer of left ankle with fat layer exposed I87.2 Venous insufficiency (chronic) (peripheral) O16.073 Varicose veins of bilateral lower extremities with other complications Follow-up Appointments ppointment in 1 week. - Dr. Celine Ahr - room 2 Return A Bathing/ Shower/ Hygiene May shower and wash wound with soap and water. - with dressing changes Edema Control - Lymphedema / SCD / Other Elevate legs to the level of the heart or above for 30 minutes daily and/or when sitting, a frequency of: Avoid standing for long periods of time. Patient to wear own compression stockings every day. Wound Treatment Wound #1 - Foot Wound Laterality: Left, Medial Cleanser: Soap and Water 1 x Per XTG/62 Days Discharge Instructions: May shower and wash wound with dial antibacterial soap and water prior to dressing change. Cleanser: Wound Cleanser 1 x Per Day/15 Days Discharge Instructions: Cleanse the wound with wound cleanser prior to applying a clean dressing using gauze sponges, not tissue or cotton balls. Cleanser: Byram Ancillary Kit - 15 Day Supply (Generic) 1 x Per Day/15 Days Discharge Instructions: Use supplies as instructed; Kit contains: (15) Saline Bullets; (15) 3x3 Gauze; 15 pr Gloves Topical: Gentamicin 1 x Per Day/15 Days Discharge Instructions: As directed by  physician Prim Dressing: KerraCel Ag Gelling Fiber Dressing, 2x2 in (silver alginate) (Generic) 1 x Per Day/15 Days ary Discharge Instructions: Apply silver alginate to wound bed as instructed Secondary Dressing: Woven Gauze Sponge, Non-Sterile 4x4 in (DME) (Generic) 1 x Per Day/15 Days Discharge Instructions: Apply over primary dressing as directed. Secured With: 28M Medipore H Soft Cloth Surgical T ape, 4 x 10 (in/yd) (DME) (Generic) 1 x Per Day/15 Days Discharge Instructions: Secure with tape as directed. Electronic Signature(s) Signed: 09/14/2021 4:47:38 PM By:  Fredirick Maudlin MD FACS Previous Signature: 09/14/2021 4:41:51 PM Version By: Sharyn Creamer RN, BSN Entered By: Fredirick Maudlin on 09/14/2021 16:46:27 -------------------------------------------------------------------------------- Problem List Details Patient Name: Date of Service: Cheryl [name], Cheryl [name] 09/14/2021 3:00 PM Medical Record Number: [id number] Patient Account Number: [id number] Date of Birth/Sex: Treating RN: [birth year] (66 y.o. [name] Primary Care Provider: Christain [place] Other Clinician: Referring Provider: Treating Provider/Extender: Percival Spanish Weeks in Treatment: 4 Active Problems ICD-10 Encounter Code Description Active Date MDM Diagnosis [phone] Non-pressure chronic ulcer of left ankle with fat layer exposed 08/15/2021 No Yes I87.2 Venous insufficiency (chronic) (peripheral) 08/15/2021 No Yes I83.893 Varicose veins of bilateral lower extremities with other complications 06/08/5463 No Yes Inactive Problems Resolved Problems Electronic Signature(s) Signed: 09/14/2021 4:44:07 PM By: Fredirick Maudlin MD FACS Entered By: Fredirick Maudlin on 09/14/2021 16:44:07 -------------------------------------------------------------------------------- Progress Note Details Patient Name: Date of Service: Cheryl [name] 09/14/2021 3:00 PM Medical Record Number: [id number] Patient  Account Number: [id number] Date of Birth/Sex: Treating RN: Mar 21, 1955 (66 y.o. [name] Primary Care Provider: Christain [place] Other Clinician: Referring Provider: Treating Provider/Extender: Percival Spanish Weeks in Treatment: 4 Subjective Chief Complaint Information obtained from Patient Patient presents for treatment of an open ulcer due to venous insufficiency History of Present Illness (HPI) ADMISSION 08/15/2021 This is a 66 year old non-smoker, nondiabetic, with a past medical history significant for significant venous insufficiency and reflux. She has previously undergone bilateral saphenous vein ablations. She wears compression stockings on a regular basis. About 2 weeks ago, she developed a blister on her left medial ankle. She thought perhaps it had rubbed on her shoes. As it became more painful and red, she was seen in the emergency department. She was diagnosed with cellulitis and prescribed doxycycline. She was referred to the wound care center for further evaluation and management. She has a geographic cluster of small open wounds on her right medial ankle, just below the malleolus. There is no erythema or induration associated with the wounds. No purulent drainage or odor. They are fairly sensitive. 08/23/2021: The cluster of wounds is smaller today. They are more superficial and have just a little bit of accumulated slough in each of the tiny pockmarks. 09/01/2021: The wounds continue to contract. Pain is less than on previous visits. There is light slough accumulation in each of the open pockmark wounds. 09/14/2021: The majority of her wounds are closed. There are just a couple of areas remaining at the most proximal portion of the geographic cluster of wounds. There was some eschar overlying these. It does look like her foot has been rubbing on something as the skin at the end of the wounded area near her toes looks a bit raw. It does not appear to  be frankly open at this time. She says she feels like the foam border dressing slides around in her footwear. Patient History Information obtained from Patient. Family History Cancer - Mother,Siblings, Heart Disease - Father, Hypertension - Father, Stroke - Siblings, No family history of Diabetes, Hereditary Spherocytosis, Kidney Disease, Lung Disease, Seizures, Thyroid Problems, Tuberculosis. Social History Never smoker, Marital Status - Divorced, Alcohol Use - Never, Drug Use - No History, Caffeine Use - Daily. Medical History Hematologic/Lymphatic Patient has history of Anemia, Lymphedema Cardiovascular Patient has history of Hypertension Musculoskeletal Patient has history of Osteoarthritis Hospitalization/Surgery History - endovenous ablation saphenous vein with laser. - breast biopsy. - cholecystectomy. - skin graft. Medical A Surgical History Notes nd Constitutional Symptoms (General Health) obesity Hematologic/Lymphatic  Grave's disease Cardiovascular varicose veins, pericarditis Objective Constitutional Slightly hypertensive. No acute distress.. Vitals Time Taken: 3:35 PM, Height: 67 in, Weight: 270 lbs, BMI: 42.3, Temperature: 97.6 F, Pulse: 73 bpm, Respiratory Rate: 18 breaths/min, Blood Pressure: 144/84 mmHg. Respiratory Normal work of breathing on room air.. General Notes: 09/14/2021: The majority of her wounds are closed. There are just a couple of areas remaining at the most proximal portion of the geographic cluster of wounds. There was some eschar overlying these. It does look like her foot has been rubbing on something as the skin at the end of the wounded area near her toes looks a bit raw. It does not appear to be frankly open at this time Integumentary (Hair, Skin) Wound #1 status is Open. Original cause of wound was Blister. The date acquired was: 08/01/2021. The wound has been in treatment 4 weeks. The wound is located on the Left,Medial Foot. The wound  measures 0.2cm length x 0.2cm width x 0.1cm depth; 0.031cm^2 area and 0.003cm^3 volume. There is Fat Layer (Subcutaneous Tissue) exposed. There is no tunneling or undermining noted. There is a medium amount of serosanguineous drainage noted. The wound margin is distinct with the outline attached to the wound base. There is small (1-33%) red granulation within the wound bed. There is a large (67-100%) amount of necrotic tissue within the wound bed including Adherent Slough. Assessment Active Problems ICD-10 Non-pressure chronic ulcer of left ankle with fat layer exposed Venous insufficiency (chronic) (peripheral) Varicose veins of bilateral lower extremities with other complications Procedures Wound #1 Pre-procedure diagnosis of Wound #1 is a Venous Leg Ulcer located on the Left,Medial Foot .Severity of Tissue Pre Debridement is: Fat layer exposed. There was a Selective/Open Wound Non-Viable Tissue Debridement with a total area of 0.04 sq cm performed by Fredirick Maudlin, MD. With the following instrument(s): Curette to remove Non-Viable tissue/material. Material removed includes Eschar and Slough and after achieving pain control using Lidocaine 5% topical ointment. No specimens were taken. A time out was conducted at 16:05, prior to the start of the procedure. A Minimum amount of bleeding was controlled with Pressure. The procedure was tolerated well with a pain level of 0 throughout and a pain level of 0 following the procedure. Post Debridement Measurements: 0.2cm length x 0.2cm width x 0.1cm depth; 0.003cm^3 volume. Character of Wound/Ulcer Post Debridement is improved. Severity of Tissue Post Debridement is: Fat layer exposed. Post procedure Diagnosis Wound #1: Same as Pre-Procedure Plan Follow-up Appointments: Return Appointment in 1 week. - Dr. Celine Ahr - room 2 Bathing/ Shower/ Hygiene: May shower and wash wound with soap and water. - with dressing changes Edema Control - Lymphedema /  SCD / Other: Elevate legs to the level of the heart or above for 30 minutes daily and/or when sitting, a frequency of: Avoid standing for long periods of time. Patient to wear own compression stockings every day. WOUND #1: - Foot Wound Laterality: Left, Medial Cleanser: Soap and Water 1 x Per NLZ/76 Days Discharge Instructions: May shower and wash wound with dial antibacterial soap and water prior to dressing change. Cleanser: Wound Cleanser 1 x Per Day/15 Days Discharge Instructions: Cleanse the wound with wound cleanser prior to applying a clean dressing using gauze sponges, not tissue or cotton balls. Cleanser: Byram Ancillary Kit - 15 Day Supply (Generic) 1 x Per Day/15 Days Discharge Instructions: Use supplies as instructed; Kit contains: (15) Saline Bullets; (15) 3x3 Gauze; 15 pr Gloves Topical: Gentamicin 1 x Per Day/15 Days Discharge Instructions: As  directed by physician Prim Dressing: KerraCel Ag Gelling Fiber Dressing, 2x2 in (silver alginate) (Generic) 1 x Per Day/15 Days ary Discharge Instructions: Apply silver alginate to wound bed as instructed Secondary Dressing: Woven Gauze Sponge, Non-Sterile 4x4 in (DME) (Generic) 1 x Per Day/15 Days Discharge Instructions: Apply over primary dressing as directed. Secured With: 79M Medipore H Soft Cloth Surgical T ape, 4 x 10 (in/yd) (DME) (Generic) 1 x Per Day/15 Days Discharge Instructions: Secure with tape as directed. 09/14/2021: The majority of her wounds are closed. There are just a couple of areas remaining at the most proximal portion of the geographic cluster of wounds. There was some eschar overlying these. It does look like her foot has been rubbing on something as the skin at the end of the wounded area near her toes looks a bit raw. It does not appear to be frankly open at this time I used a curette to debride the eschar from the open wound areas. We will continue to use the topical gentamicin with silver alginate. Instead of a  foam border dressing, however, we are just going to use gauze and Medipore tape in the hopes that this will alleviate the friction. She will follow-up in 1 week. Electronic Signature(s) Signed: 09/14/2021 4:47:03 PM By: Fredirick Maudlin MD FACS Entered By: Fredirick Maudlin on 09/14/2021 16:47:03 -------------------------------------------------------------------------------- HxROS Details Patient Name: Date of Service: Cheryl [name], Cheryl [name] 09/14/2021 3:00 PM Medical Record Number: [id number] Patient Account Number: [id number] Date of Birth/Sex: Treating RN: 22-Jul-1955 (66 y.o. [name] Primary Care Provider: Christain [place] Other Clinician: Referring Provider: Treating Provider/Extender: Percival Spanish Weeks in Treatment: 4 Information Obtained From Patient Constitutional Symptoms (General Health) Medical History: Past Medical History Notes: obesity Hematologic/Lymphatic Medical History: Positive for: Anemia; Lymphedema Past Medical History Notes: Grave's disease Cardiovascular Medical History: Positive for: Hypertension Past Medical History Notes: varicose veins, pericarditis Musculoskeletal Medical History: Positive for: Osteoarthritis Immunizations Pneumococcal Vaccine: Received Pneumococcal Vaccination: Yes Received Pneumococcal Vaccination On or After 60th Birthday: Yes Implantable Devices None Hospitalization / Surgery History Type of Hospitalization/Surgery endovenous ablation saphenous vein with laser breast biopsy cholecystectomy skin graft Family and Social History Cancer: Yes - Mother,Siblings; Diabetes: No; Heart Disease: Yes - Father; Hereditary Spherocytosis: No; Hypertension: Yes - Father; Kidney Disease: No; Lung Disease: No; Seizures: No; Stroke: Yes - Siblings; Thyroid Problems: No; Tuberculosis: No; Never smoker; Marital Status - Divorced; Alcohol Use: Never; Drug Use: No History; Caffeine Use: Daily; Financial Concerns:  No; Food, Clothing or Shelter Needs: No; Support System Lacking: No; Transportation Concerns: No Electronic Signature(s) Signed: 09/14/2021 4:47:38 PM By: Fredirick Maudlin MD FACS Signed: 09/15/2021 4:07:48 PM By: Sabas Sous By: Fredirick Maudlin on 09/14/2021 16:45:45 -------------------------------------------------------------------------------- SuperBill Details Patient Name: Date of Service: Cheryl [name], Cheryl [name] 09/14/2021 Medical Record Number: [id number] Patient Account Number: [id number] Date of Birth/Sex: Treating RN: 04/28/1955 (66 y.o. [name] Primary Care Provider: Christain [place] Other Clinician: Referring Provider: Treating Provider/Extender: Percival Spanish Weeks in Treatment: 4 Diagnosis Coding ICD-10 Codes Code Description [phone] Non-pressure chronic ulcer of left ankle with fat layer exposed I87.2 Venous insufficiency (chronic) (peripheral) I83.893 Varicose veins of bilateral lower extremities with other complications Facility Procedures CPT4 Code: [id number] IC L Description: 84696 - DEBRIDE WOUND 1ST 20 SQ CM OR < D-10 Diagnosis Description 97.322 Non-pressure chronic ulcer of left ankle with fat layer exposed Modifier: Quantity: 1 Physician Procedures : CPT4 Code Description Modifier [id number] - WC PHYS LEVEL 4 - EST PT  25 ICD-10 Diagnosis Description G85.496 Non-pressure chronic ulcer of left ankle with fat layer exposed I87.2 Venous insufficiency (chronic) (peripheral) Z65.994 Varicose veins of  bilateral lower extremities with other complications Quantity: 1 : [id number] - WC PHYS DEBR WO ANESTH 20 SQ CM ICD-10 Diagnosis Description L97.322 Non-pressure chronic ulcer of left ankle with fat layer exposed Quantity: 1 Electronic Signature(s) Signed: 09/14/2021 4:47:18 PM By: Fredirick Maudlin MD FACS Entered By: Fredirick Maudlin on 09/14/2021 16:47:18

## 2021-09-20 ENCOUNTER — Encounter (HOSPITAL_BASED_OUTPATIENT_CLINIC_OR_DEPARTMENT_OTHER): Payer: BC Managed Care – PPO | Admitting: General Surgery

## 2021-09-20 DIAGNOSIS — L97322 Non-pressure chronic ulcer of left ankle with fat layer exposed: Secondary | ICD-10-CM | POA: Diagnosis not present

## 2021-09-21 NOTE — Progress Notes (Signed)
NUSAYBA, CADENAS ([id number]) Visit Report for 09/20/2021 Arrival Information Details Patient Name: Date of Service: Cheryl [name], Cheryl [name] 09/20/2021 3:30 PM Medical Record Number: [id number] Patient Account Number: [id number] Date of Birth/Sex: Treating RN: 05/06/1955 (66 y.o. Elam Dutch Primary Care Laval Cafaro: Christain [place] Other Clinician: Referring Niccolo Burggraf: Treating Eli Pattillo/Extender: Percival Spanish Weeks in Treatment: 5 Visit Information History Since Last Visit Added or deleted any medications: No Patient Arrived: Ambulatory Any new allergies or adverse reactions: No Arrival Time: 15:17 Had a fall or experienced change in No Accompanied By: self activities of daily living that may affect Transfer Assistance: None risk of falls: Patient Identification Verified: Yes Signs or symptoms of abuse/neglect since last visito No Secondary Verification Process Completed: Yes Hospitalized since last visit: No Patient Requires Transmission-Based Precautions: No Implantable device outside of the clinic excluding No Patient Has Alerts: No cellular tissue based products placed in the center since last visit: Has Dressing in Place as Prescribed: Yes Has Compression in Place as Prescribed: No Pain Present Now: No Notes did not have time to put on compression stockings today Electronic Signature(s) Signed: 09/20/2021 5:58:34 PM By: Baruch Gouty RN, BSN Entered By: Baruch Gouty on 09/20/2021 15:19:02 -------------------------------------------------------------------------------- Encounter Discharge Information Details Patient Name: Date of Service: Cheryl [name] 09/20/2021 3:30 PM Medical Record Number: [id number] Patient Account Number: [id number] Date of Birth/Sex: Treating RN: Jun 10, 1955 (66 y.o. Elam Dutch Primary Care Tyquez Hollibaugh: Christain [place] Other Clinician: Referring Sevana Grandinetti: Treating Haelee Bolen/Extender: Percival Spanish Weeks  in Treatment: 5 Encounter Discharge Information Items Post Procedure Vitals Discharge Condition: Stable Temperature (F): 98 Ambulatory Status: Ambulatory Pulse (bpm): 73 Discharge Destination: Home Respiratory Rate (breaths/min): 18 Transportation: Private Auto Blood Pressure (mmHg): 138/80 Accompanied By: self Schedule Follow-up Appointment: Yes Clinical Summary of Care: Patient Declined Electronic Signature(s) Signed: 09/20/2021 5:58:34 PM By: Baruch Gouty RN, BSN Entered By: Baruch Gouty on 09/20/2021 15:43:31 -------------------------------------------------------------------------------- Lower Extremity Assessment Details Patient Name: Date of Service: Cheryl [name], Cheryl [name] 09/20/2021 3:30 PM Medical Record Number: [id number] Patient Account Number: [id number] Date of Birth/Sex: Treating RN: 1955/03/18 (66 y.o. Elam Dutch Primary Care Gunther Zawadzki: Christain [place] Other Clinician: Referring Legacy Lacivita: Treating Jonetta Dagley/Extender: Percival Spanish Weeks in Treatment: 5 Edema Assessment Assessed: [Left: No] [Right: No] Edema: [Left: Ye] [Right: s] Calf Left: Right: Point of Measurement: From Medial Instep 47 cm Ankle Left: Right: Point of Measurement: From Medial Instep 23 cm Vascular Assessment Pulses: Dorsalis Pedis Palpable: [Left:Yes] Electronic Signature(s) Signed: 09/20/2021 5:58:34 PM By: Baruch Gouty RN, BSN Entered By: Baruch Gouty on 09/20/2021 15:23:33 -------------------------------------------------------------------------------- Multi Wound Chart Details Patient Name: Date of Service: Cheryl [name] 09/20/2021 3:30 PM Medical Record Number: [id number] Patient Account Number: [id number] Date of Birth/Sex: Treating RN: March 11, 1955 (66 y.o. Elam Dutch Primary Care Shavaun Osterloh: Christain [place] Other Clinician: Referring Dillinger Aston: Treating Thaddius Manes/Extender: Percival Spanish Weeks in Treatment: 5 Vital  Signs Height(in): 67 Pulse(bpm): 17 Weight(lbs): 270 Blood Pressure(mmHg): 138/80 Body Mass Index(BMI): 42.3 Temperature(F): 98 Respiratory Rate(breaths/min): 20 Photos: [1:Left, Medial Foot] [N/A:N/A N/A] Wound Location: [1:Blister] [N/A:N/A] Wounding Event: [1:Venous Leg Ulcer] [N/A:N/A] Primary Etiology: [1:Anemia, Lymphedema, Hypertension, N/A] Comorbid History: [1:Osteoarthritis 08/01/2021] [N/A:N/A] Date Acquired: [1:5] [N/A:N/A] Weeks of Treatment: [1:Open] [N/A:N/A] Wound Status: [1:No] [N/A:N/A] Wound Recurrence: [1:0.2x0.2x0.1] [N/A:N/A] Measurements L x W x D (cm) [1:0.031] [N/A:N/A] A (cm) : rea [1:0.003] [N/A:N/A] Volume (cm) : [1:99.70%] [N/A:N/A] % Reduction in A [1:rea: 99.70%] [N/A:N/A] % Reduction in Volume: [1:Full Thickness Without  Exposed] [N/A:N/A] Classification: [1:Support Structures Small] [N/A:N/A] Exudate A mount: [1:Serous] [N/A:N/A] Exudate Type: [1:amber] [N/A:N/A] Exudate Color: [1:Distinct, outline attached] [N/A:N/A] Wound Margin: [1:Small (1-33%)] [N/A:N/A] Granulation A mount: [1:Pink] [N/A:N/A] Granulation Quality: [1:None Present (0%)] [N/A:N/A] Necrotic A mount: [1:Fat Layer (Subcutaneous Tissue): Yes N/A] Exposed Structures: [1:Fascia: No Tendon: No Muscle: No Joint: No Bone: No Large (67-100%)] [N/A:N/A] Epithelialization: [1:Debridement - Selective/Open Wound N/A] Debridement: Pre-procedure Verification/Time Out 15:30 [N/A:N/A] Taken: [1:Lidocaine 4% Topical Solution] [N/A:N/A] Pain Control: [1:Skin/Epidermis] [N/A:N/A] Level: [1:1] [N/A:N/A] Debridement A (sq cm): [1:rea Curette] [N/A:N/A] Instrument: [1:None] [N/A:N/A] Bleeding: [1:0] [N/A:N/A] Procedural Pain: [1:0] [N/A:N/A] Post Procedural Pain: [1:Procedure was tolerated well] [N/A:N/A] Debridement Treatment Response: [1:0.2x0.2x0.1] [N/A:N/A] Post Debridement Measurements L x W x D (cm) [1:0.003] [N/A:N/A] Post Debridement Volume: (cm) [1:Debridement]  [N/A:N/A] Treatment Notes Electronic Signature(s) Signed: 09/20/2021 3:37:07 PM By: Fredirick Maudlin MD FACS Signed: 09/20/2021 5:58:34 PM By: Baruch Gouty RN, BSN Entered By: Fredirick Maudlin on 09/20/2021 15:37:07 -------------------------------------------------------------------------------- Multi-Disciplinary Care Plan Details Patient Name: Date of Service: Cheryl [name], Cheryl [name] 09/20/2021 3:30 PM Medical Record Number: [id number] Patient Account Number: [id number] Date of Birth/Sex: Treating RN: May 25, 1955 (66 y.o. Elam Dutch Primary Care Melody Cirrincione: Christain [place] Other Clinician: Referring Conor Lata: Treating Daphane Odekirk/Extender: Percival Spanish Weeks in Treatment: 5 Multidisciplinary Care Plan reviewed with physician Active Inactive Wound/Skin Impairment Nursing Diagnoses: Impaired tissue integrity Knowledge deficit related to ulceration/compromised skin integrity Goals: Patient/caregiver will verbalize understanding of skin care regimen Date Initiated: 08/15/2021 Target Resolution Date: 10/20/2021 Goal Status: Active Ulcer/skin breakdown will have a volume reduction of 30% by week 4 Date Initiated: 08/15/2021 Date Inactivated: 09/20/2021 Target Resolution Date: 09/22/2021 Goal Status: Met Ulcer/skin breakdown will have a volume reduction of 50% by week 8 Date Initiated: 09/20/2021 Target Resolution Date: 10/20/2021 Goal Status: Active Interventions: Assess patient/caregiver ability to obtain necessary supplies Assess patient/caregiver ability to perform ulcer/skin care regimen upon admission and as needed Assess ulceration(s) every visit Treatment Activities: Skin care regimen initiated : 08/15/2021 Topical wound management initiated : 08/15/2021 Notes: Electronic Signature(s) Signed: 09/20/2021 5:58:34 PM By: Baruch Gouty RN, BSN Entered By: Baruch Gouty on 09/20/2021  15:27:55 -------------------------------------------------------------------------------- Pain Assessment Details Patient Name: Date of Service: Cheryl [name], Cheryl [name] 09/20/2021 3:30 PM Medical Record Number: [id number] Patient Account Number: [id number] Date of Birth/Sex: Treating RN: 12/29/1955 (66 y.o. Elam Dutch Primary Care Gleen Ripberger: Christain [place] Other Clinician: Referring Azarian Starace: Treating Yazhini Mcaulay/Extender: Percival Spanish Weeks in Treatment: 5 Active Problems Location of Pain Severity and Description of Pain Patient Has Paino No Site Locations Rate the pain. Current Pain Level: 0 Pain Management and Medication Current Pain Management: Electronic Signature(s) Signed: 09/20/2021 5:58:34 PM By: Baruch Gouty RN, BSN Entered By: Baruch Gouty on 09/20/2021 15:18:41 -------------------------------------------------------------------------------- Patient/Caregiver Education Details Patient Name: Date of Service: Cheryl [name] 8/16/2023andnbsp3:30 PM Medical Record Number: [id number] Patient Account Number: [id number] Date of Birth/Gender: Treating RN: [birth year] (66 y.o. Elam Dutch Primary Care Physician: Christain [place] Other Clinician: Referring Physician: Treating Physician/Extender: Ronney Asters in Treatment: 5 Education Assessment Education Provided To: Patient Education Topics Provided Venous: Methods: Explain/Verbal Responses: Reinforcements needed, State content correctly Electronic Signature(s) Signed: 09/20/2021 5:58:34 PM By: Baruch Gouty RN, BSN Entered By: Baruch Gouty on 09/20/2021 15:28:10 -------------------------------------------------------------------------------- Wound Assessment Details Patient Name: Date of Service: Cheryl [name], Cheryl [name] 09/20/2021 3:30 PM Medical Record Number: [id number] Patient Account Number: [id number] Date of Birth/Sex: Treating RN: 07/31/1955 (66 y.o. Elam Dutch Primary Care Blayze Haen: Kathryne Eriksson  H Other Clinician: Referring Yarissa Reining: Treating Diangelo Radel/Extender: Percival Spanish Weeks in Treatment: 5 Wound Status Wound Number: 1 Primary Etiology: Venous Leg Ulcer Wound Location: Left, Medial Foot Wound Status: Open Wounding Event: Blister Comorbid History: Anemia, Lymphedema, Hypertension, Osteoarthritis Date Acquired: 08/01/2021 Weeks Of Treatment: 5 Clustered Wound: No Photos Wound Measurements Length: (cm) 0.2 Width: (cm) 0.2 Depth: (cm) 0.1 Area: (cm) 0.031 Volume: (cm) 0.003 % Reduction in Area: 99.7% % Reduction in Volume: 99.7% Epithelialization: Large (67-100%) Tunneling: No Undermining: No Wound Description Classification: Full Thickness Without Exposed Support Structures Wound Margin: Distinct, outline attached Exudate Amount: Small Exudate Type: Serous Exudate Color: amber Foul Odor After Cleansing: No Slough/Fibrino No Wound Bed Granulation Amount: Small (1-33%) Exposed Structure Granulation Quality: Pink Fascia Exposed: No Necrotic Amount: None Present (0%) Fat Layer (Subcutaneous Tissue) Exposed: Yes Tendon Exposed: No Muscle Exposed: No Joint Exposed: No Bone Exposed: No Treatment Notes Wound #1 (Foot) Wound Laterality: Left, Medial Cleanser Soap and Water Discharge Instruction: May shower and wash wound with dial antibacterial soap and water prior to dressing change. Wound Cleanser Discharge Instruction: Cleanse the wound with wound cleanser prior to applying a clean dressing using gauze sponges, not tissue or cotton balls. [employer] Ancillary Kit - 15 Day Supply Discharge Instruction: Use supplies as instructed; Kit contains: (15) Saline Bullets; (15) 3x3 Gauze; 15 pr Gloves Peri-Wound Care Topical Gentamicin Discharge Instruction: As directed by physician Primary Dressing KerraCel Ag Gelling Fiber Dressing, 2x2 in (silver alginate) Discharge Instruction: Apply silver  alginate to wound bed as instructed Secondary Dressing Woven Gauze Sponge, Non-Sterile 4x4 in Discharge Instruction: Apply over primary dressing as directed. Secured With 53M Medipore H Soft Cloth Surgical T ape, 4 x 10 (in/yd) Discharge Instruction: Secure with tape as directed. Compression Wrap Compression Stockings Add-Ons Electronic Signature(s) Signed: 09/20/2021 5:58:34 PM By: Baruch Gouty RN, BSN Entered By: Baruch Gouty on 09/20/2021 15:25:27 -------------------------------------------------------------------------------- Vitals Details Patient Name: Date of Service: Cheryl [name] 09/20/2021 3:30 PM Medical Record Number: [id number] Patient Account Number: [id number] Date of Birth/Sex: Treating RN: 09/29/1955 (65 y.o. Elam Dutch Primary Care Analyse Angst: Christain [place] Other Clinician: Referring Courtenay Creger: Treating Braylynn Ghan/Extender: Percival Spanish Weeks in Treatment: 5 Vital Signs Time Taken: 15:18 Temperature (F): 98 Height (in): 67 Pulse (bpm): 73 Weight (lbs): 270 Respiratory Rate (breaths/min): 20 Body Mass Index (BMI): 42.3 Blood Pressure (mmHg): 138/80 Reference Range: 80 - 120 mg / dl Electronic Signature(s) Signed: 09/20/2021 5:58:34 PM By: Baruch Gouty RN, BSN Entered By: Baruch Gouty on 09/20/2021 15:18:32

## 2021-09-21 NOTE — Progress Notes (Signed)
Cheryl [name], Cheryl [name] ([id number]) Visit Report for 09/20/2021 Chief Complaint Document Details Patient Name: Date of Service: Cheryl [name], Cheryl [name] 09/20/2021 3:30 PM Medical Record Number: [id number] Patient Account Number: [id number] Date of Birth/Sex: Treating RN: February 28, 1955 (66 y.o. Elam Dutch Primary Care Provider: Christain Sacramento Other Clinician: Referring Provider: Treating Provider/Extender: Percival Spanish Weeks in Treatment: 5 Information Obtained from: Patient Chief Complaint Patient presents for treatment of an open ulcer due to venous insufficiency Electronic Signature(s) Signed: 09/20/2021 3:37:14 PM By: Fredirick Maudlin MD FACS Entered By: Fredirick Maudlin on 09/20/2021 15:37:14 -------------------------------------------------------------------------------- Debridement Details Patient Name: Date of Service: Cheryl [name] 09/20/2021 3:30 PM Medical Record Number: [id number] Patient Account Number: [id number] Date of Birth/Sex: Treating RN: 05-08-1955 (66 y.o. Elam Dutch Primary Care Provider: Christain [place] Other Clinician: Referring Provider: Treating Provider/Extender: Percival Spanish Weeks in Treatment: 5 Debridement Performed for Assessment: Wound #1 Left,Medial Foot Performed By: Physician Fredirick Maudlin, MD Debridement Type: Debridement Severity of Tissue Pre Debridement: Fat layer exposed Level of Consciousness (Pre-procedure): Awake and Alert Pre-procedure Verification/Time Out Yes - 15:30 Taken: Start Time: 15:30 Pain Control: Lidocaine 4% Topical Solution T Area Debrided (L x W): otal 1 (cm) x 1 (cm) = 1 (cm) Tissue and other material debrided: Non-Viable, Skin: Epidermis, Biofilm Level: Skin/Epidermis Debridement Description: Selective/Open Wound Instrument: Curette Bleeding: None Procedural Pain: 0 Post Procedural Pain: 0 Response to Treatment: Procedure was tolerated well Level of Consciousness (Post-  Awake and Alert procedure): Post Debridement Measurements of Total Wound Length: (cm) 0.2 Width: (cm) 0.2 Depth: (cm) 0.1 Volume: (cm) 0.003 Character of Wound/Ulcer Post Debridement: Improved Severity of Tissue Post Debridement: Fat layer exposed Post Procedure Diagnosis Same as Pre-procedure Electronic Signature(s) Signed: 09/20/2021 4:09:38 PM By: Fredirick Maudlin MD FACS Signed: 09/20/2021 5:58:34 PM By: Baruch Gouty RN, BSN Entered By: Baruch Gouty on 09/20/2021 15:35:33 -------------------------------------------------------------------------------- HPI Details Patient Name: Date of Service: Cheryl [name] 09/20/2021 3:30 PM Medical Record Number: [id number] Patient Account Number: [id number] Date of Birth/Sex: Treating RN: [birth year] (66 y.o. Elam Dutch Primary Care Provider: Christain [place] Other Clinician: Referring Provider: Treating Provider/Extender: Percival Spanish Weeks in Treatment: 5 History of Present Illness HPI Description: ADMISSION 08/15/2021 This is a 66 year old non-smoker, nondiabetic, with a past medical history significant for significant venous insufficiency and reflux. She has previously undergone bilateral saphenous vein ablations. She wears compression stockings on a regular basis. About 2 weeks ago, she developed a blister on her left medial ankle. She thought perhaps it had rubbed on her shoes. As it became more painful and red, she was seen in the emergency department. She was diagnosed with cellulitis and prescribed doxycycline. She was referred to the wound care center for further evaluation and management. She has a geographic cluster of small open wounds on her right medial ankle, just below the malleolus. There is no erythema or induration associated with the wounds. No purulent drainage or odor. They are fairly sensitive. 08/23/2021: The cluster of wounds is smaller today. They are more superficial and have just a  little bit of accumulated slough in each of the tiny pockmarks. 09/01/2021: The wounds continue to contract. Pain is less than on previous visits. There is light slough accumulation in each of the open pockmark wounds. 09/14/2021: The majority of her wounds are closed. There are just a couple of areas remaining at the most proximal portion of the geographic cluster of wounds. There was some eschar overlying these. It  does look like her foot has been rubbing on something as the skin at the end of the wounded area near her toes looks a bit raw. It does not appear to be frankly open at this time. She says she feels like the foam border dressing slides around in her footwear. 09/20/2021: We are down to just 2 small open areas. They are superficial and flush with the surrounding skin surface. Just a little bit of eschar and biofilm present. Electronic Signature(s) Signed: 09/20/2021 3:37:50 PM By: Fredirick Maudlin MD FACS Entered By: Fredirick Maudlin on 09/20/2021 15:37:50 -------------------------------------------------------------------------------- Physical Exam Details Patient Name: Date of Service: Cheryl [name], Cheryl [name] 09/20/2021 3:30 PM Medical Record Number: [id number] Patient Account Number: [id number] Date of Birth/Sex: Treating RN: November 11, 1955 (66 y.o. Elam Dutch Primary Care Provider: Christain [place] Other Clinician: Referring Provider: Treating Provider/Extender: Percival Spanish Weeks in Treatment: 5 Constitutional . . . . No acute distress.Marland Kitchen Respiratory Normal work of breathing on room air.. Notes 09/20/2021: We are down to just 2 small open areas. They are superficial and flush with the surrounding skin surface. Just a little bit of eschar and biofilm present. Electronic Signature(s) Signed: 09/20/2021 3:38:33 PM By: Fredirick Maudlin MD FACS Entered By: Fredirick Maudlin on 09/20/2021  15:38:33 -------------------------------------------------------------------------------- Physician Orders Details Patient Name: Date of Service: Cheryl [name], Cheryl [name] 09/20/2021 3:30 PM Medical Record Number: [id number] Patient Account Number: [id number] Date of Birth/Sex: Treating RN: 1956-01-03 (66 y.o. Martyn Malay, Linda Primary Care Provider: Christain Sacramento Other Clinician: Referring Provider: Treating Provider/Extender: Percival Spanish Weeks in Treatment: 5 Verbal / Phone Orders: No Diagnosis Coding ICD-10 Coding Code Description [phone] Non-pressure chronic ulcer of left ankle with fat layer exposed I87.2 Venous insufficiency (chronic) (peripheral) O71.219 Varicose veins of bilateral lower extremities with other complications Follow-up Appointments ppointment in 1 week. - Dr. Celine Ahr - room 2 Return A Friday 8/25 @ 3:45 pm Anesthetic Wound #1 Left,Medial Foot (In clinic) Topical Lidocaine 4% applied to wound bed Bathing/ Shower/ Hygiene May shower and wash wound with soap and water. - with dressing changes Edema Control - Lymphedema / SCD / Other Elevate legs to the level of the heart or above for 30 minutes daily and/or when sitting, a frequency of: Avoid standing for long periods of time. Patient to wear own compression stockings every day. Wound Treatment Wound #1 - Foot Wound Laterality: Left, Medial Cleanser: Soap and Water 1 x Per XJO/83 Days Discharge Instructions: May shower and wash wound with dial antibacterial soap and water prior to dressing change. Cleanser: Wound Cleanser 1 x Per Day/15 Days Discharge Instructions: Cleanse the wound with wound cleanser prior to applying a clean dressing using gauze sponges, not tissue or cotton balls. Cleanser: Byram Ancillary Kit - 15 Day Supply (Generic) 1 x Per Day/15 Days Discharge Instructions: Use supplies as instructed; Kit contains: (15) Saline Bullets; (15) 3x3 Gauze; 15 pr Gloves Topical: Gentamicin 1 x  Per Day/15 Days Discharge Instructions: As directed by physician Prim Dressing: KerraCel Ag Gelling Fiber Dressing, 2x2 in (silver alginate) (Generic) 1 x Per Day/15 Days ary Discharge Instructions: Apply silver alginate to wound bed as instructed Secondary Dressing: Woven Gauze Sponge, Non-Sterile 4x4 in (Generic) 1 x Per Day/15 Days Discharge Instructions: Apply over primary dressing as directed. Secured With: 36M Medipore H Soft Cloth Surgical T ape, 4 x 10 (in/yd) (Generic) 1 x Per Day/15 Days Discharge Instructions: Secure with tape as directed. Electronic Signature(s) Signed: 09/20/2021 4:09:38 PM By: Fredirick Maudlin  MD FACS Signed: 09/20/2021 5:58:34 PM By: Baruch Gouty RN, BSN Entered By: Baruch Gouty on 09/20/2021 15:42:21 -------------------------------------------------------------------------------- Problem List Details Patient Name: Date of Service: Cheryl [name], Cheryl [name] 09/20/2021 3:30 PM Medical Record Number: [id number] Patient Account Number: [id number] Date of Birth/Sex: Treating RN: [birth year] (66 y.o. Elam Dutch Primary Care Provider: Christain [place] Other Clinician: Referring Provider: Treating Provider/Extender: Percival Spanish Weeks in Treatment: 5 Active Problems ICD-10 Encounter Code Description Active Date MDM Diagnosis [phone] Non-pressure chronic ulcer of left ankle with fat layer exposed 08/15/2021 No Yes I87.2 Venous insufficiency (chronic) (peripheral) 08/15/2021 No Yes I83.893 Varicose veins of bilateral lower extremities with other complications 1/60/1093 No Yes Inactive Problems Resolved Problems Electronic Signature(s) Signed: 09/20/2021 3:36:02 PM By: Fredirick Maudlin MD FACS Entered By: Fredirick Maudlin on 09/20/2021 15:36:02 -------------------------------------------------------------------------------- Progress Note Details Patient Name: Date of Service: Cheryl [name] 09/20/2021 3:30 PM Medical Record Number:  [id number] Patient Account Number: [id number] Date of Birth/Sex: Treating RN: 09-26-1955 (66 y.o. Elam Dutch Primary Care Provider: Christain [place] Other Clinician: Referring Provider: Treating Provider/Extender: Percival Spanish Weeks in Treatment: 5 Subjective Chief Complaint Information obtained from Patient Patient presents for treatment of an open ulcer due to venous insufficiency History of Present Illness (HPI) ADMISSION 08/15/2021 This is a 66 year old non-smoker, nondiabetic, with a past medical history significant for significant venous insufficiency and reflux. She has previously undergone bilateral saphenous vein ablations. She wears compression stockings on a regular basis. About 2 weeks ago, she developed a blister on her left medial ankle. She thought perhaps it had rubbed on her shoes. As it became more painful and red, she was seen in the emergency department. She was diagnosed with cellulitis and prescribed doxycycline. She was referred to the wound care center for further evaluation and management. She has a geographic cluster of small open wounds on her right medial ankle, just below the malleolus. There is no erythema or induration associated with the wounds. No purulent drainage or odor. They are fairly sensitive. 08/23/2021: The cluster of wounds is smaller today. They are more superficial and have just a little bit of accumulated slough in each of the tiny pockmarks. 09/01/2021: The wounds continue to contract. Pain is less than on previous visits. There is light slough accumulation in each of the open pockmark wounds. 09/14/2021: The majority of her wounds are closed. There are just a couple of areas remaining at the most proximal portion of the geographic cluster of wounds. There was some eschar overlying these. It does look like her foot has been rubbing on something as the skin at the end of the wounded area near her toes looks a bit raw. It does  not appear to be frankly open at this time. She says she feels like the foam border dressing slides around in her footwear. 09/20/2021: We are down to just 2 small open areas. They are superficial and flush with the surrounding skin surface. Just a little bit of eschar and biofilm present. Patient History Information obtained from Patient. Family History Cancer - Mother,Siblings, Heart Disease - Father, Hypertension - Father, Stroke - Siblings, No family history of Diabetes, Hereditary Spherocytosis, Kidney Disease, Lung Disease, Seizures, Thyroid Problems, Tuberculosis. Social History Never smoker, Marital Status - Divorced, Alcohol Use - Never, Drug Use - No History, Caffeine Use - Daily. Medical History Hematologic/Lymphatic Patient has history of Anemia, Lymphedema Cardiovascular Patient has history of Hypertension Musculoskeletal Patient has history of Osteoarthritis Hospitalization/Surgery History - endovenous  ablation saphenous vein with laser. - breast biopsy. - cholecystectomy. - skin graft. Medical A Surgical History Notes nd Constitutional Symptoms (General Health) obesity Hematologic/Lymphatic Grave's disease Cardiovascular varicose veins, pericarditis Objective Constitutional No acute distress.. Vitals Time Taken: 3:18 PM, Height: 67 in, Weight: 270 lbs, BMI: 42.3, Temperature: 98 F, Pulse: 73 bpm, Respiratory Rate: 20 breaths/min, Blood Pressure: 138/80 mmHg. Respiratory Normal work of breathing on room air.. General Notes: 09/20/2021: We are down to just 2 small open areas. They are superficial and flush with the surrounding skin surface. Just a little bit of eschar and biofilm present. Integumentary (Hair, Skin) Wound #1 status is Open. Original cause of wound was Blister. The date acquired was: 08/01/2021. The wound has been in treatment 5 weeks. The wound is located on the Left,Medial Foot. The wound measures 0.2cm length x 0.2cm width x 0.1cm depth; 0.031cm^2  area and 0.003cm^3 volume. There is Fat Layer (Subcutaneous Tissue) exposed. There is no tunneling or undermining noted. There is a small amount of serous drainage noted. The wound margin is distinct with the outline attached to the wound base. There is small (1-33%) pink granulation within the wound bed. There is no necrotic tissue within the wound bed. Assessment Active Problems ICD-10 Non-pressure chronic ulcer of left ankle with fat layer exposed Venous insufficiency (chronic) (peripheral) Varicose veins of bilateral lower extremities with other complications Procedures Wound #1 Pre-procedure diagnosis of Wound #1 is a Venous Leg Ulcer located on the Left,Medial Foot .Severity of Tissue Pre Debridement is: Fat layer exposed. There was a Selective/Open Wound Skin/Epidermis Debridement with a total area of 1 sq cm performed by Fredirick Maudlin, MD. With the following instrument(s): Curette to remove Non-Viable tissue/material. Material removed includes Skin: Epidermis and Biofilm and after achieving pain control using Lidocaine 4% Topical Solution. No specimens were taken. A time out was conducted at 15:30, prior to the start of the procedure. There was no bleeding. The procedure was tolerated well with a pain level of 0 throughout and a pain level of 0 following the procedure. Post Debridement Measurements: 0.2cm length x 0.2cm width x 0.1cm depth; 0.003cm^3 volume. Character of Wound/Ulcer Post Debridement is improved. Severity of Tissue Post Debridement is: Fat layer exposed. Post procedure Diagnosis Wound #1: Same as Pre-Procedure Plan Follow-up Appointments: Return Appointment in 1 week. - Dr. Celine Ahr - room 2 Friday 8/25 @ 3:45 pm Bathing/ Shower/ Hygiene: May shower and wash wound with soap and water. - with dressing changes Edema Control - Lymphedema / SCD / Other: Elevate legs to the level of the heart or above for 30 minutes daily and/or when sitting, a frequency of: Avoid  standing for long periods of time. Patient to wear own compression stockings every day. WOUND #1: - Foot Wound Laterality: Left, Medial Cleanser: Soap and Water 1 x Per PJK/93 Days Discharge Instructions: May shower and wash wound with dial antibacterial soap and water prior to dressing change. Cleanser: Wound Cleanser 1 x Per Day/15 Days Discharge Instructions: Cleanse the wound with wound cleanser prior to applying a clean dressing using gauze sponges, not tissue or cotton balls. Cleanser: Byram Ancillary Kit - 15 Day Supply (Generic) 1 x Per Day/15 Days Discharge Instructions: Use supplies as instructed; Kit contains: (15) Saline Bullets; (15) 3x3 Gauze; 15 pr Gloves Topical: Gentamicin 1 x Per Day/15 Days Discharge Instructions: As directed by physician Prim Dressing: KerraCel Ag Gelling Fiber Dressing, 2x2 in (silver alginate) (Generic) 1 x Per Day/15 Days ary Discharge Instructions: Apply silver alginate to  wound bed as instructed Secondary Dressing: Woven Gauze Sponge, Non-Sterile 4x4 in (Generic) 1 x Per Day/15 Days Discharge Instructions: Apply over primary dressing as directed. Secured With: 11M Medipore H Soft Cloth Surgical T ape, 4 x 10 (in/yd) (Generic) 1 x Per Day/15 Days Discharge Instructions: Secure with tape as directed. 09/20/2021: We are down to just 2 small open areas. They are superficial and flush with the surrounding skin surface. Just a little bit of eschar and biofilm present. I used a curette to debride the eschar from her wounds. We will continue to use gentamicin topically with silver alginate and a gauze wrap. Follow-up in 1 week. Electronic Signature(s) Signed: 09/20/2021 3:39:13 PM By: Fredirick Maudlin MD FACS Entered By: Fredirick Maudlin on 09/20/2021 15:39:13 -------------------------------------------------------------------------------- HxROS Details Patient Name: Date of Service: Cheryl [name], Cheryl [name] 09/20/2021 3:30 PM Medical Record Number:  [id number] Patient Account Number: [id number] Date of Birth/Sex: Treating RN: [birth year] (66 y.o. Elam Dutch Primary Care Provider: Christain Sacramento Other Clinician: Referring Provider: Treating Provider/Extender: Percival Spanish Weeks in Treatment: 5 Information Obtained From Patient Constitutional Symptoms (General Health) Medical History: Past Medical History Notes: obesity Hematologic/Lymphatic Medical History: Positive for: Anemia; Lymphedema Past Medical History Notes: Grave's disease Cardiovascular Medical History: Positive for: Hypertension Past Medical History Notes: varicose veins, pericarditis Musculoskeletal Medical History: Positive for: Osteoarthritis Immunizations Pneumococcal Vaccine: Received Pneumococcal Vaccination: Yes Received Pneumococcal Vaccination On or After 60th Birthday: Yes Implantable Devices None Hospitalization / Surgery History Type of Hospitalization/Surgery endovenous ablation saphenous vein with laser breast biopsy cholecystectomy skin graft Family and Social History Cancer: Yes - Mother,Siblings; Diabetes: No; Heart Disease: Yes - Father; Hereditary Spherocytosis: No; Hypertension: Yes - Father; Kidney Disease: No; Lung Disease: No; Seizures: No; Stroke: Yes - Siblings; Thyroid Problems: No; Tuberculosis: No; Never smoker; Marital Status - Divorced; Alcohol Use: Never; Drug Use: No History; Caffeine Use: Daily; Financial Concerns: No; Food, Clothing or Shelter Needs: No; Support System Lacking: No; Transportation Concerns: No Engineer, maintenance) Signed: 09/20/2021 4:09:38 PM By: Fredirick Maudlin MD FACS Signed: 09/20/2021 5:58:34 PM By: Baruch Gouty RN, BSN Entered By: Fredirick Maudlin on 09/20/2021 15:37:58 -------------------------------------------------------------------------------- [facility] Details Patient Name: Date of Service: Cheryl [name] 09/20/2021 Medical Record Number: [id number] Patient  Account Number: [id number] Date of Birth/Sex: Treating RN: [birth year] (66 y.o. Elam Dutch Primary Care Provider: Christain [place] Other Clinician: Referring Provider: Treating Provider/Extender: Percival Spanish Weeks in Treatment: 5 Diagnosis Coding ICD-10 Codes Code Description [phone] Non-pressure chronic ulcer of left ankle with fat layer exposed I87.2 Venous insufficiency (chronic) (peripheral) I83.893 Varicose veins of bilateral lower extremities with other complications Facility Procedures CPT4 Code: [id number] Description: 75170 - DEBRIDE WOUND 1ST 20 SQ CM OR < ICD-10 Diagnosis Description L97.322 Non-pressure chronic ulcer of left ankle with fat layer exposed Modifier: Quantity: 1 Physician Procedures : CPT4 Code Description Modifier [id number] - WC PHYS LEVEL 4 - EST PT 25 ICD-10 Diagnosis Description L97.322 Non-pressure chronic ulcer of left ankle with fat layer exposed I87.2 Venous insufficiency (chronic) (peripheral) H67.591 Varicose veins of  bilateral lower extremities with other complications Quantity: 1 : [id number] - WC PHYS DEBR WO ANESTH 20 SQ CM ICD-10 Diagnosis Description S17.793 Non-pressure chronic ulcer of left ankle with fat layer exposed Quantity: 1 Electronic Signature(s) Signed: 09/20/2021 3:39:32 PM By: Fredirick Maudlin MD FACS Entered By: Fredirick Maudlin on 09/20/2021 15:39:32

## 2021-09-29 ENCOUNTER — Encounter (HOSPITAL_BASED_OUTPATIENT_CLINIC_OR_DEPARTMENT_OTHER): Payer: BC Managed Care – PPO | Admitting: General Surgery

## 2021-09-29 DIAGNOSIS — L97322 Non-pressure chronic ulcer of left ankle with fat layer exposed: Secondary | ICD-10-CM | POA: Diagnosis not present

## 2021-10-02 NOTE — Progress Notes (Signed)
Cheryl, [name] ([id number]) Visit Report for 09/29/2021 Chief Complaint Document Details Patient Name: Date of Service: Cheryl [name], Cheryl [name] 09/29/2021 3:45 PM Medical Record Number: [id number] Patient Account Number: [id number] Date of Birth/Sex: Treating RN: 11/18/1955 (66 y.o. Cheryl [name] Primary Care Provider: Christain Sacramento Other Clinician: Referring Provider: Treating Provider/Extender: Percival Spanish Weeks in Treatment: 6 Information Obtained from: Patient Chief Complaint Patient presents for treatment of an open ulcer due to venous insufficiency Electronic Signature(s) Signed: 09/29/2021 4:34:55 PM By: Fredirick Maudlin MD FACS Entered By: Fredirick Maudlin on 09/29/2021 16:34:55 -------------------------------------------------------------------------------- Debridement Details Patient Name: Date of Service: Cheryl [name], Cheryl [name] 09/29/2021 3:45 PM Medical Record Number: [id number] Patient Account Number: [id number] Date of Birth/Sex: Treating RN: September 17, 1955 (66 y.o. Iver Nestle, Jamie Primary Care Provider: Christain Sacramento Other Clinician: Referring Provider: Treating Provider/Extender: Percival Spanish Weeks in Treatment: 6 Debridement Performed for Assessment: Wound #1 Left,Medial Foot Performed By: Physician Fredirick Maudlin, MD Debridement Type: Debridement Severity of Tissue Pre Debridement: Fat layer exposed Level of Consciousness (Pre-procedure): Awake and Alert Pre-procedure Verification/Time Out Yes - 15:57 Taken: Start Time: 15:58 Pain Control: Lidocaine 4% T opical Solution T Area Debrided (L x W): otal 0.5 (cm) x 0.5 (cm) = 0.25 (cm) Tissue and other material debrided: Eschar, Slough, Slough Level: Non-Viable Tissue Debridement Description: Selective/Open Wound Instrument: Curette Bleeding: None Hemostasis Achieved: Pressure Procedural Pain: 3 Post Procedural Pain: 0 Response to Treatment: Procedure was tolerated  well Level of Consciousness (Post- Awake and Alert procedure): Post Debridement Measurements of Total Wound Length: (cm) 0.5 Width: (cm) 0.5 Depth: (cm) 0.1 Volume: (cm) 0.02 Character of Wound/Ulcer Post Debridement: Improved Severity of Tissue Post Debridement: Fat layer exposed Post Procedure Diagnosis Same as Pre-procedure Electronic Signature(s) Signed: 09/29/2021 4:55:05 PM By: Fredirick Maudlin MD FACS Signed: 10/02/2021 5:12:19 PM By: Blanche East RN Entered By: Blanche East on 09/29/2021 15:59:21 -------------------------------------------------------------------------------- HPI Details Patient Name: Date of Service: Cheryl [name], Cheryl [name] 09/29/2021 3:45 PM Medical Record Number: [id number] Patient Account Number: [id number] Date of Birth/Sex: Treating RN: [birth year] (66 y.o. Cheryl [name] Primary Care Provider: Christain Sacramento Other Clinician: Referring Provider: Treating Provider/Extender: Percival Spanish Weeks in Treatment: 6 History of Present Illness HPI Description: ADMISSION 08/15/2021 This is a 66 year old non-smoker, nondiabetic, with a past medical history significant for significant venous insufficiency and reflux. She has previously undergone bilateral saphenous vein ablations. She wears compression stockings on a regular basis. About 2 weeks ago, she developed a blister on her left medial ankle. She thought perhaps it had rubbed on her shoes. As it became more painful and red, she was seen in the emergency department. She was diagnosed with cellulitis and prescribed doxycycline. She was referred to the wound care center for further evaluation and management. She has a geographic cluster of small open wounds on her right medial ankle, just below the malleolus. There is no erythema or induration associated with the wounds. No purulent drainage or odor. They are fairly sensitive. 08/23/2021: The cluster of wounds is smaller today. They are more  superficial and have just a little bit of accumulated slough in each of the tiny pockmarks. 09/01/2021: The wounds continue to contract. Pain is less than on previous visits. There is light slough accumulation in each of the open pockmark wounds. 09/14/2021: The majority of her wounds are closed. There are just a couple of areas remaining at the most proximal portion of the geographic cluster of wounds. There was some eschar  overlying these. It does look like her foot has been rubbing on something as the skin at the end of the wounded area near her toes looks a bit raw. It does not appear to be frankly open at this time. She says she feels like the foam border dressing slides around in her footwear. 09/20/2021: We are down to just 2 small open areas. They are superficial and flush with the surrounding skin surface. Just a little bit of eschar and biofilm present. 09/29/2021: There are 2 areas that appeared open, but after removing eschar from one site, it was found to be healed. The remaining open area has a little bit of eschar and slough without any concern for infection. Electronic Signature(s) Signed: 09/29/2021 4:35:43 PM By: Fredirick Maudlin MD FACS Entered By: Fredirick Maudlin on 09/29/2021 16:35:43 -------------------------------------------------------------------------------- Physical Exam Details Patient Name: Date of Service: Cheryl [name], Cheryl [name] 09/29/2021 3:45 PM Medical Record Number: [id number] Patient Account Number: [id number] Date of Birth/Sex: Treating RN: 02-22-1955 (66 y.o. Cheryl [name] Primary Care Provider: Christain Sacramento Other Clinician: Referring Provider: Treating Provider/Extender: Percival Spanish Weeks in Treatment: 6 Constitutional Hypertensive, asymptomatic. . . . No acute distress.Marland Kitchen Respiratory Normal work of breathing on room air.. Notes 09/29/2021: There are 2 areas that appeared open, but after removing eschar from one site, it was found to  be healed. The remaining open area has a little bit of eschar and slough without any concern for infection. Electronic Signature(s) Signed: 09/29/2021 4:45:34 PM By: Fredirick Maudlin MD FACS Entered By: Fredirick Maudlin on 09/29/2021 16:45:34 -------------------------------------------------------------------------------- Physician Orders Details Patient Name: Date of Service: LEISEL, PINETTE 09/29/2021 3:45 PM Medical Record Number: [id number] Patient Account Number: [id number] Date of Birth/Sex: Treating RN: 11/30/1955 (66 y.o. Iver Nestle, Jamie Primary Care Provider: Christain Sacramento Other Clinician: Referring Provider: Treating Provider/Extender: Percival Spanish Weeks in Treatment: 6 Verbal / Phone Orders: No Diagnosis Coding ICD-10 Coding Code Description [phone] Non-pressure chronic ulcer of left ankle with fat layer exposed I87.2 Venous insufficiency (chronic) (peripheral) M78.675 Varicose veins of bilateral lower extremities with other complications Follow-up Appointments ppointment in 1 week. - Dr. Celine Ahr - room 2 Return A Friday 9/1 @ 3:15 pm Anesthetic Wound #1 Left,Medial Foot (In clinic) Topical Lidocaine 4% applied to wound bed Bathing/ Shower/ Hygiene May shower and wash wound with soap and water. - with dressing changes Edema Control - Lymphedema / SCD / Other Elevate legs to the level of the heart or above for 30 minutes daily and/or when sitting, a frequency of: Avoid standing for long periods of time. Patient to wear own compression stockings every day. Wound Treatment Wound #1 - Foot Wound Laterality: Left, Medial Cleanser: Soap and Water 1 x Per QGB/20 Days Discharge Instructions: May shower and wash wound with dial antibacterial soap and water prior to dressing change. Cleanser: Wound Cleanser 1 x Per Day/15 Days Discharge Instructions: Cleanse the wound with wound cleanser prior to applying a clean dressing using gauze sponges, not tissue  or cotton balls. Cleanser: Byram Ancillary Kit - 15 Day Supply (Generic) 1 x Per Day/15 Days Discharge Instructions: Use supplies as instructed; Kit contains: (15) Saline Bullets; (15) 3x3 Gauze; 15 pr Gloves Topical: Gentamicin 1 x Per Day/15 Days Discharge Instructions: As directed by physician Prim Dressing: KerraCel Ag Gelling Fiber Dressing, 2x2 in (silver alginate) (Generic) 1 x Per Day/15 Days ary Discharge Instructions: Apply silver alginate to wound bed as instructed Secondary Dressing: Woven Gauze Sponge, Non-Sterile 4x4  in (Generic) 1 x Per Day/15 Days Discharge Instructions: Apply over primary dressing as directed. Secured With: 60M Medipore H Soft Cloth Surgical T ape, 4 x 10 (in/yd) (Generic) 1 x Per Day/15 Days Discharge Instructions: Secure with tape as directed. Electronic Signature(s) Signed: 09/29/2021 4:55:05 PM By: Fredirick Maudlin MD FACS Entered By: Fredirick Maudlin on 09/29/2021 16:45:44 -------------------------------------------------------------------------------- Problem List Details Patient Name: Date of Service: SHUNDRA, WIRSING 09/29/2021 3:45 PM Medical Record Number: [id number] Patient Account Number: [id number] Date of Birth/Sex: Treating RN: [birth year] (66 y.o. Marta Lamas Primary Care Provider: Christain Sacramento Other Clinician: Referring Provider: Treating Provider/Extender: Percival Spanish Weeks in Treatment: 6 Active Problems ICD-10 Encounter Code Description Active Date MDM Diagnosis [phone] Non-pressure chronic ulcer of left ankle with fat layer exposed 08/15/2021 No Yes I87.2 Venous insufficiency (chronic) (peripheral) 08/15/2021 No Yes I83.893 Varicose veins of bilateral lower extremities with other complications 03/07/8655 No Yes Inactive Problems Resolved Problems Electronic Signature(s) Signed: 09/29/2021 4:34:34 PM By: Fredirick Maudlin MD FACS Entered By: Fredirick Maudlin on 09/29/2021  16:34:34 -------------------------------------------------------------------------------- Progress Note Details Patient Name: Date of Service: Cheryl [name], Cheryl [name] 09/29/2021 3:45 PM Medical Record Number: [id number] Patient Account Number: [id number] Date of Birth/Sex: Treating RN: [birth year] (66 y.o. Cheryl [name] Primary Care Provider: Christain Sacramento Other Clinician: Referring Provider: Treating Provider/Extender: Percival Spanish Weeks in Treatment: 6 Subjective Chief Complaint Information obtained from Patient Patient presents for treatment of an open ulcer due to venous insufficiency History of Present Illness (HPI) ADMISSION 08/15/2021 This is a 66 year old non-smoker, nondiabetic, with a past medical history significant for significant venous insufficiency and reflux. She has previously undergone bilateral saphenous vein ablations. She wears compression stockings on a regular basis. About 2 weeks ago, she developed a blister on her left medial ankle. She thought perhaps it had rubbed on her shoes. As it became more painful and red, she was seen in the emergency department. She was diagnosed with cellulitis and prescribed doxycycline. She was referred to the wound care center for further evaluation and management. She has a geographic cluster of small open wounds on her right medial ankle, just below the malleolus. There is no erythema or induration associated with the wounds. No purulent drainage or odor. They are fairly sensitive. 08/23/2021: The cluster of wounds is smaller today. They are more superficial and have just a little bit of accumulated slough in each of the tiny pockmarks. 09/01/2021: The wounds continue to contract. Pain is less than on previous visits. There is light slough accumulation in each of the open pockmark wounds. 09/14/2021: The majority of her wounds are closed. There are just a couple of areas remaining at the most proximal portion of the  geographic cluster of wounds. There was some eschar overlying these. It does look like her foot has been rubbing on something as the skin at the end of the wounded area near her toes looks a bit raw. It does not appear to be frankly open at this time. She says she feels like the foam border dressing slides around in her footwear. 09/20/2021: We are down to just 2 small open areas. They are superficial and flush with the surrounding skin surface. Just a little bit of eschar and biofilm present. 09/29/2021: There are 2 areas that appeared open, but after removing eschar from one site, it was found to be healed. The remaining open area has a little bit of eschar and slough without any concern for infection. Patient History  Information obtained from Patient. Family History Cancer - Mother,Siblings, Heart Disease - Father, Hypertension - Father, Stroke - Siblings, No family history of Diabetes, Hereditary Spherocytosis, Kidney Disease, Lung Disease, Seizures, Thyroid Problems, Tuberculosis. Social History Never smoker, Marital Status - Divorced, Alcohol Use - Never, Drug Use - No History, Caffeine Use - Daily. Medical History Hematologic/Lymphatic Patient has history of Anemia, Lymphedema Cardiovascular Patient has history of Hypertension Musculoskeletal Patient has history of Osteoarthritis Hospitalization/Surgery History - endovenous ablation saphenous vein with laser. - breast biopsy. - cholecystectomy. - skin graft. Medical A Surgical History Notes nd Constitutional Symptoms (General Health) obesity Hematologic/Lymphatic Grave's disease Cardiovascular varicose veins, pericarditis Objective Constitutional Hypertensive, asymptomatic. No acute distress.. Vitals Time Taken: 3:43 PM, Height: 67 in, Weight: 270 lbs, BMI: 42.3, Temperature: 98.2 F, Pulse: 91 bpm, Respiratory Rate: 20 breaths/min, Blood Pressure: 172/82 mmHg. Respiratory Normal work of breathing on room air.. General  Notes: 09/29/2021: There are 2 areas that appeared open, but after removing eschar from one site, it was found to be healed. The remaining open area has a little bit of eschar and slough without any concern for infection. Integumentary (Hair, Skin) Wound #1 status is Open. Original cause of wound was Blister. The date acquired was: 08/01/2021. The wound has been in treatment 6 weeks. The wound is located on the Left,Medial Foot. The wound measures 0.2cm length x 0.2cm width x 0.1cm depth; 0.031cm^2 area and 0.003cm^3 volume. There is Fat Layer (Subcutaneous Tissue) exposed. There is no tunneling or undermining noted. There is a small amount of serous drainage noted. The wound margin is distinct with the outline attached to the wound base. There is small (1-33%) pink granulation within the wound bed. There is no necrotic tissue within the wound bed. Assessment Active Problems ICD-10 Non-pressure chronic ulcer of left ankle with fat layer exposed Venous insufficiency (chronic) (peripheral) Varicose veins of bilateral lower extremities with other complications Procedures Wound #1 Pre-procedure diagnosis of Wound #1 is a Venous Leg Ulcer located on the Left,Medial Foot .Severity of Tissue Pre Debridement is: Fat layer exposed. There was a Selective/Open Wound Non-Viable Tissue Debridement with a total area of 0.25 sq cm performed by Fredirick Maudlin, MD. With the following instrument(s): Curette Material removed includes Eschar and Slough and after achieving pain control using Lidocaine 4% T opical Solution. No specimens were taken. A time out was conducted at 15:57, prior to the start of the procedure. There was no bleeding. The procedure was tolerated well with a pain level of 3 throughout and a pain level of 0 following the procedure. Post Debridement Measurements: 0.5cm length x 0.5cm width x 0.1cm depth; 0.02cm^3 volume. Character of Wound/Ulcer Post Debridement is improved. Severity of Tissue  Post Debridement is: Fat layer exposed. Post procedure Diagnosis Wound #1: Same as Pre-Procedure Plan Follow-up Appointments: Return Appointment in 1 week. - Dr. Celine Ahr - room 2 Friday 9/1 @ 3:15 pm Anesthetic: Wound #1 Left,Medial Foot: (In clinic) Topical Lidocaine 4% applied to wound bed Bathing/ Shower/ Hygiene: May shower and wash wound with soap and water. - with dressing changes Edema Control - Lymphedema / SCD / Other: Elevate legs to the level of the heart or above for 30 minutes daily and/or when sitting, a frequency of: Avoid standing for long periods of time. Patient to wear own compression stockings every day. WOUND #1: - Foot Wound Laterality: Left, Medial Cleanser: Soap and Water 1 x Per WUJ/81 Days Discharge Instructions: May shower and wash wound with dial antibacterial soap and water  prior to dressing change. Cleanser: Wound Cleanser 1 x Per Day/15 Days Discharge Instructions: Cleanse the wound with wound cleanser prior to applying a clean dressing using gauze sponges, not tissue or cotton balls. Cleanser: Byram Ancillary Kit - 15 Day Supply (Generic) 1 x Per Day/15 Days Discharge Instructions: Use supplies as instructed; Kit contains: (15) Saline Bullets; (15) 3x3 Gauze; 15 pr Gloves Topical: Gentamicin 1 x Per Day/15 Days Discharge Instructions: As directed by physician Prim Dressing: KerraCel Ag Gelling Fiber Dressing, 2x2 in (silver alginate) (Generic) 1 x Per Day/15 Days ary Discharge Instructions: Apply silver alginate to wound bed as instructed Secondary Dressing: Woven Gauze Sponge, Non-Sterile 4x4 in (Generic) 1 x Per Day/15 Days Discharge Instructions: Apply over primary dressing as directed. Secured With: 68M Medipore H Soft Cloth Surgical T ape, 4 x 10 (in/yd) (Generic) 1 x Per Day/15 Days Discharge Instructions: Secure with tape as directed. 09/29/2021: There are 2 areas that appeared open, but after removing eschar from one site, it was found to be healed.  The remaining open area has a little bit of eschar and slough without any concern for infection. I used a curette to debride eschar and slough off of the remaining wound. We will continue using topical gentamicin with silver alginate. The patient is wearing her own compression stocking. She will follow-up in 1 week. Electronic Signature(s) Signed: 09/29/2021 4:46:16 PM By: Fredirick Maudlin MD FACS Entered By: Fredirick Maudlin on 09/29/2021 16:46:16 -------------------------------------------------------------------------------- HxROS Details Patient Name: Date of Service: Cheryl [name], Cheryl [name] 09/29/2021 3:45 PM Medical Record Number: [id number] Patient Account Number: [id number] Date of Birth/Sex: Treating RN: 04/12/1955 (66 y.o. Cheryl [name] Primary Care Provider: Christain Sacramento Other Clinician: Referring Provider: Treating Provider/Extender: Percival Spanish Weeks in Treatment: 6 Information Obtained From Patient Constitutional Symptoms (General Health) Medical History: Past Medical History Notes: obesity Hematologic/Lymphatic Medical History: Positive for: Anemia; Lymphedema Past Medical History Notes: Grave's disease Cardiovascular Medical History: Positive for: Hypertension Past Medical History Notes: varicose veins, pericarditis Musculoskeletal Medical History: Positive for: Osteoarthritis Immunizations Pneumococcal Vaccine: Received Pneumococcal Vaccination: Yes Received Pneumococcal Vaccination On or After 60th Birthday: Yes Implantable Devices None Hospitalization / Surgery History Type of Hospitalization/Surgery endovenous ablation saphenous vein with laser breast biopsy cholecystectomy skin graft Family and Social History Cancer: Yes - Mother,Siblings; Diabetes: No; Heart Disease: Yes - Father; Hereditary Spherocytosis: No; Hypertension: Yes - Father; Kidney Disease: No; Lung Disease: No; Seizures: No; Stroke: Yes - Siblings; Thyroid  Problems: No; Tuberculosis: No; Never smoker; Marital Status - Divorced; Alcohol Use: Never; Drug Use: No History; Caffeine Use: Daily; Financial Concerns: No; Food, Clothing or Shelter Needs: No; Support System Lacking: No; Transportation Concerns: No Electronic Signature(s) Signed: 09/29/2021 4:55:05 PM By: Fredirick Maudlin MD FACS Signed: 10/02/2021 5:02:03 PM By: Sabas Sous By: Fredirick Maudlin on 09/29/2021 16:38:51 -------------------------------------------------------------------------------- [place] Details Patient Name: Date of Service: Cheryl [name], Cheryl [name] 09/29/2021 Medical Record Number: [id number] Patient Account Number: [id number] Date of Birth/Sex: Treating RN: January 09, 1956 (66 y.o. Marta Lamas Primary Care Provider: Christain Sacramento Other Clinician: Referring Provider: Treating Provider/Extender: Percival Spanish Weeks in Treatment: 6 Diagnosis Coding ICD-10 Codes Code Description [phone] Non-pressure chronic ulcer of left ankle with fat layer exposed I87.2 Venous insufficiency (chronic) (peripheral) F09.323 Varicose veins of bilateral lower extremities with other complications Facility Procedures CPT4 Code: [id number] Description: 54270 - DEBRIDE WOUND 1ST 20 SQ CM OR < ICD-10 Diagnosis Description L97.322 Non-pressure chronic ulcer of left ankle with fat layer exposed Modifier: Quantity:  1 Physician Procedures : CPT4 Code Description Modifier [id number] - WC PHYS LEVEL 4 - EST PT 25 ICD-10 Diagnosis Description V20.233 Non-pressure chronic ulcer of left ankle with fat layer exposed I87.2 Venous insufficiency (chronic) (peripheral) I35.686 Varicose veins of  bilateral lower extremities with other complications Quantity: 1 : [id number] - WC PHYS DEBR WO ANESTH 20 SQ CM ICD-10 Diagnosis Description L97.322 Non-pressure chronic ulcer of left ankle with fat layer exposed Quantity: 1 Electronic Signature(s) Signed: 09/29/2021 4:47:16 PM  By: Fredirick Maudlin MD FACS Entered By: Fredirick Maudlin on 09/29/2021 16:47:15

## 2021-10-02 NOTE — Progress Notes (Signed)
KILA, GODINA ([id number]) Visit Report for 09/29/2021 Arrival Information Details Patient Name: Date of Service: EMOGENE, MURATALLA 09/29/2021 3:45 PM Medical Record Number: [id number] Patient Account Number: [id number] Date of Birth/Sex: Treating RN: 1955/02/29 (66 y.o. Iver Nestle, Jamie Primary Care Isaiyah Feldhaus: Christain [place] Other Clinician: Referring Terri Rorrer: Treating Alexius Hangartner/Extender: Percival Spanish Weeks in Treatment: 6 Visit Information History Since Last Visit All ordered tests and consults were completed: Yes Patient Arrived: Ambulatory Added or deleted any medications: No Arrival Time: 15:42 Any new allergies or adverse reactions: No Accompanied By: self Had a fall or experienced change in No Transfer Assistance: None activities of daily living that may affect Patient Identification Verified: Yes risk of falls: Secondary Verification Process Completed: Yes Signs or symptoms of abuse/neglect since last visito No Patient Requires Transmission-Based Precautions: No Hospitalized since last visit: No Patient Has Alerts: No Implantable device outside of the clinic excluding No cellular tissue based products placed in the center since last visit: Has Dressing in Place as Prescribed: Yes Pain Present Now: No Electronic Signature(s) Signed: 10/02/2021 5:12:19 PM By: Blanche East RN Entered By: Blanche East on 09/29/2021 15:43:07 -------------------------------------------------------------------------------- Encounter Discharge Information Details Patient Name: Date of Service: JAYLANIE, BOSCHEE 09/29/2021 3:45 PM Medical Record Number: [id number] Patient Account Number: [id number] Date of Birth/Sex: Treating RN: 1955-09-24 (66 y.o. Marta Lamas Primary Care Devereaux Grayson: Christain [place] Other Clinician: Referring Arlissa Monteverde: Treating Dorice Stiggers/Extender: Percival Spanish Weeks in Treatment: 6 Encounter Discharge Information Items Post Procedure  Vitals Discharge Condition: Stable Temperature (F): 98.2 Ambulatory Status: Ambulatory Pulse (bpm): 91 Discharge Destination: Home Respiratory Rate (breaths/min): 20 Transportation: Private Auto Blood Pressure (mmHg): 172/82 Accompanied By: self Schedule Follow-up Appointment: Yes Clinical Summary of Care: Electronic Signature(s) Signed: 10/02/2021 5:12:19 PM By: Blanche East RN Entered By: Blanche East on 09/29/2021 16:19:48 -------------------------------------------------------------------------------- Lower Extremity Assessment Details Patient Name: Date of Service: KERRIA, SAPIEN 09/29/2021 3:45 PM Medical Record Number: [id number] Patient Account Number: [id number] Date of Birth/Sex: Treating RN: May 24, 1955 (66 y.o. Marta Lamas Primary Care Rozena Fierro: Christain [place] Other Clinician: Referring Nasha Diss: Treating Tiffanyann Deroo/Extender: Percival Spanish Weeks in Treatment: 6 Edema Assessment Assessed: [Left: No] [Right: No] Edema: [Left: Ye] [Right: s] Calf Left: Right: Point of Measurement: From Medial Instep 48 cm Ankle Left: Right: Point of Measurement: From Medial Instep 24 cm Vascular Assessment Pulses: Dorsalis Pedis Palpable: [Left:Yes] Electronic Signature(s) Signed: 10/02/2021 5:12:19 PM By: Blanche East RN Entered By: Blanche East on 09/29/2021 15:44:40 -------------------------------------------------------------------------------- Multi Wound Chart Details Patient Name: Date of Service: Alexander Mt 09/29/2021 3:45 PM Medical Record Number: [id number] Patient Account Number: [id number] Date of Birth/Sex: Treating RN: May 01, 1955 (66 y.o. [name] Primary Care Rennae Ferraiolo: Christain [place] Other Clinician: Referring Taniyah Ballow: Treating Matia Zelada/Extender: Percival Spanish Weeks in Treatment: 6 Vital Signs Height(in): 67 Pulse(bpm): 91 Weight(lbs): 270 Blood Pressure(mmHg): 172/82 Body Mass Index(BMI):  42.3 Temperature(F): 98.2 Respiratory Rate(breaths/min): 20 Photos: [N/A:N/A] Left, Medial Foot N/A N/A Wound Location: Blister N/A N/A Wounding Event: Venous Leg Ulcer N/A N/A Primary Etiology: Anemia, Lymphedema, Hypertension, N/A N/A Comorbid History: Osteoarthritis 08/01/2021 N/A N/A Date Acquired: 6 N/A N/A Weeks of Treatment: Open N/A N/A Wound Status: No N/A N/A Wound Recurrence: 0.2x0.2x0.1 N/A N/A Measurements L x W x D (cm) 0.031 N/A N/A A (cm) : rea 0.003 N/A N/A Volume (cm) : 99.70% N/A N/A % Reduction in A rea: 99.70% N/A N/A % Reduction in Volume: Full Thickness Without Exposed N/A  N/A Classification: Support Structures Small N/A N/A Exudate A mount: Serous N/A N/A Exudate Type: amber N/A N/A Exudate Color: Distinct, outline attached N/A N/A Wound Margin: Small (1-33%) N/A N/A Granulation A mount: Pink N/A N/A Granulation Quality: None Present (0%) N/A N/A Necrotic A mount: Fat Layer (Subcutaneous Tissue): Yes N/A N/A Exposed Structures: Fascia: No Tendon: No Muscle: No Joint: No Bone: No Large (67-100%) N/A N/A Epithelialization: Debridement - Selective/Open Wound N/A N/A Debridement: Pre-procedure Verification/Time Out 15:57 N/A N/A Taken: Lidocaine 4% Topical Solution N/A N/A Pain Control: Necrotic/Eschar, Slough N/A N/A Tissue Debrided: Non-Viable Tissue N/A N/A Level: 0.25 N/A N/A Debridement A (sq cm): rea Curette N/A N/A Instrument: None N/A N/A Bleeding: Pressure N/A N/A Hemostasis A chieved: 3 N/A N/A Procedural Pain: 0 N/A N/A Post Procedural Pain: Procedure was tolerated well N/A N/A Debridement Treatment Response: 0.5x0.5x0.1 N/A N/A Post Debridement Measurements L x W x D (cm) 0.02 N/A N/A Post Debridement Volume: (cm) Debridement N/A N/A Procedures Performed: Treatment Notes Wound #1 (Foot) Wound Laterality: Left, Medial Cleanser Soap and Water Discharge Instruction: May shower and wash wound  with dial antibacterial soap and water prior to dressing change. Wound Cleanser Discharge Instruction: Cleanse the wound with wound cleanser prior to applying a clean dressing using gauze sponges, not tissue or cotton balls. Byram Ancillary Kit - 15 Day Supply Discharge Instruction: Use supplies as instructed; Kit contains: (15) Saline Bullets; (15) 3x3 Gauze; 15 pr Gloves Peri-Wound Care Topical Gentamicin Discharge Instruction: As directed by physician Primary Dressing KerraCel Ag Gelling Fiber Dressing, 2x2 in (silver alginate) Discharge Instruction: Apply silver alginate to wound bed as instructed Secondary Dressing Woven Gauze Sponge, Non-Sterile 4x4 in Discharge Instruction: Apply over primary dressing as directed. Secured With 15M Medipore H Soft Cloth Surgical T ape, 4 x 10 (in/yd) Discharge Instruction: Secure with tape as directed. Compression Wrap Compression Stockings Add-Ons Electronic Signature(s) Signed: 09/29/2021 4:34:48 PM By: Fredirick Maudlin MD FACS Signed: 10/02/2021 5:02:03 PM By: Sabas Sous By: Fredirick Maudlin on 09/29/2021 16:34:48 -------------------------------------------------------------------------------- Multi-Disciplinary Care Plan Details Patient Name: Date of Service: QUENESHA, DOUGLASS 09/29/2021 3:45 PM Medical Record Number: [id number] Patient Account Number: [id number] Date of Birth/Sex: Treating RN: 05/20/1955 (66 y.o. Marta Lamas Primary Care Quintessa Simmerman: Christain [place] Other Clinician: Referring Nisha Dhami: Treating Ashraf Mesta/Extender: Ronney Asters in Treatment: 6 Multidisciplinary Care Plan reviewed with physician Active Inactive Wound/Skin Impairment Nursing Diagnoses: Impaired tissue integrity Knowledge deficit related to ulceration/compromised skin integrity Goals: Patient/caregiver will verbalize understanding of skin care regimen Date Initiated: 08/15/2021 Target Resolution Date:  10/20/2021 Goal Status: Active Ulcer/skin breakdown will have a volume reduction of 30% by week 4 Date Initiated: 08/15/2021 Date Inactivated: 09/20/2021 Target Resolution Date: 09/22/2021 Goal Status: Met Ulcer/skin breakdown will have a volume reduction of 50% by week 8 Date Initiated: 09/20/2021 Target Resolution Date: 10/20/2021 Goal Status: Active Interventions: Assess patient/caregiver ability to obtain necessary supplies Assess patient/caregiver ability to perform ulcer/skin care regimen upon admission and as needed Assess ulceration(s) every visit Treatment Activities: Skin care regimen initiated : 08/15/2021 Topical wound management initiated : 08/15/2021 Notes: Electronic Signature(s) Signed: 10/02/2021 5:12:19 PM By: Blanche East RN Entered By: Blanche East on 09/29/2021 15:46:45 -------------------------------------------------------------------------------- Pain Assessment Details Patient Name: Date of Service: CASHAE, WEICH 09/29/2021 3:45 PM Medical Record Number: [id number] Patient Account Number: [id number] Date of Birth/Sex: Treating RN: 08-02-1955 (66 y.o. Marta Lamas Primary Care [place]: Other Clinician: Christain [place] Referring Derreck Wiltsey: Treating Innocence Schlotzhauer/Extender: Percival Spanish  Weeks in Treatment: 6 Active Problems Location of Pain Severity and Description of Pain Patient Has Paino No Site Locations Pain Management and Medication Current Pain Management: Electronic Signature(s) Signed: 10/02/2021 5:12:19 PM By: Blanche East RN Entered By: Blanche East on 09/29/2021 15:43:57 -------------------------------------------------------------------------------- Patient/Caregiver Education Details Patient Name: Date of Service: Alexander Mt 8/25/2023andnbsp3:45 PM Medical Record Number: [id number] Patient Account Number: [id number] Date of Birth/Gender: Treating RN: 1955/05/21 (66 y.o. Marta Lamas Primary Care Physician:  Christain [place] Other Clinician: Referring Physician: Treating Physician/Extender: Ronney Asters in Treatment: 6 Education Assessment Education Provided To: Patient Education Topics Provided Wound/Skin Impairment: Methods: Explain/Verbal Responses: Reinforcements needed, State content correctly Electronic Signature(s) Signed: 10/02/2021 5:12:19 PM By: Blanche East RN Entered By: Blanche East on 09/29/2021 15:47:02 -------------------------------------------------------------------------------- Wound Assessment Details Patient Name: Date of Service: FLANNERY, CAVALLERO 09/29/2021 3:45 PM Medical Record Number: [id number] Patient Account Number: [id number] Date of Birth/Sex: Treating RN: 09/05/1955 (66 y.o. [name] Primary Care Sibyl Mikula: Christain [place] Other Clinician: Referring Brazil Voytko: Treating Levis Nazir/Extender: Percival Spanish Weeks in Treatment: 6 Wound Status Wound Number: 1 Primary Etiology: Venous Leg Ulcer Wound Location: Left, Medial Foot Wound Status: Open Wounding Event: Blister Comorbid History: Anemia, Lymphedema, Hypertension, Osteoarthritis Date Acquired: 08/01/2021 Weeks Of Treatment: 6 Clustered Wound: No Photos Wound Measurements Length: (cm) 0.2 Width: (cm) 0.2 Depth: (cm) 0.1 Area: (cm) 0.031 Volume: (cm) 0.003 % Reduction in Area: 99.7% % Reduction in Volume: 99.7% Epithelialization: Large (67-100%) Tunneling: No Undermining: No Wound Description Classification: Full Thickness Without Exposed Support Structures Wound Margin: Distinct, outline attached Exudate Amount: Small Exudate Type: Serous Exudate Color: amber Foul Odor After Cleansing: No Slough/Fibrino No Wound Bed Granulation Amount: Small (1-33%) Exposed Structure Granulation Quality: Pink Fascia Exposed: No Necrotic Amount: None Present (0%) Fat Layer (Subcutaneous Tissue) Exposed: Yes Tendon Exposed: No Muscle Exposed:  No Joint Exposed: No Bone Exposed: No Treatment Notes Wound #1 (Foot) Wound Laterality: Left, Medial Cleanser Soap and Water Discharge Instruction: May shower and wash wound with dial antibacterial soap and water prior to dressing change. Wound Cleanser Discharge Instruction: Cleanse the wound with wound cleanser prior to applying a clean dressing using gauze sponges, not tissue or cotton balls. Byram Ancillary Kit - 15 Day Supply Discharge Instruction: Use supplies as instructed; Kit contains: (15) Saline Bullets; (15) 3x3 Gauze; 15 pr Gloves Peri-Wound Care Topical Gentamicin Discharge Instruction: As directed by physician Primary Dressing KerraCel Ag Gelling Fiber Dressing, 2x2 in (silver alginate) Discharge Instruction: Apply silver alginate to wound bed as instructed Secondary Dressing Woven Gauze Sponge, Non-Sterile 4x4 in Discharge Instruction: Apply over primary dressing as directed. Secured With 86M Medipore H Soft Cloth Surgical T ape, 4 x 10 (in/yd) Discharge Instruction: Secure with tape as directed. Compression Wrap Compression Stockings Add-Ons Electronic Signature(s) Signed: 10/02/2021 5:02:03 PM By: Adline Peals Signed: 10/02/2021 5:12:19 PM By: Blanche East RN Entered By: Blanche East on 09/29/2021 15:46:14 -------------------------------------------------------------------------------- Vitals Details Patient Name: Date of Service: ARDYN, FORGE 09/29/2021 3:45 PM Medical Record Number: [id number] Patient Account Number: [id number] Date of Birth/Sex: Treating RN: [birth year] (66 y.o. Iver Nestle, Jamie Primary Care [place]: Christain [place] Other Clinician: Referring Amaia Lavallie: Treating Osei Anger/Extender: Percival Spanish Weeks in Treatment: 6 Vital Signs Time Taken: 15:43 Temperature (F): 98.2 Height (in): 67 Pulse (bpm): 91 Weight (lbs): 270 Respiratory Rate (breaths/min): 20 Body Mass Index (BMI): 42.3 Blood Pressure (mmHg):  172/82 Reference Range: 80 - 120 mg / dl Electronic Signature(s)  Signed: 10/02/2021 5:12:19 PM By: Blanche East RN Entered By: Blanche East on 09/29/2021 15:43:48

## 2021-10-06 ENCOUNTER — Encounter (HOSPITAL_BASED_OUTPATIENT_CLINIC_OR_DEPARTMENT_OTHER): Payer: Medicare Other | Attending: General Surgery | Admitting: General Surgery

## 2021-10-06 DIAGNOSIS — I872 Venous insufficiency (chronic) (peripheral): Secondary | ICD-10-CM | POA: Diagnosis not present

## 2021-10-06 DIAGNOSIS — I83893 Varicose veins of bilateral lower extremities with other complications: Secondary | ICD-10-CM | POA: Insufficient documentation

## 2021-10-06 DIAGNOSIS — Z872 Personal history of diseases of the skin and subcutaneous tissue: Secondary | ICD-10-CM | POA: Diagnosis not present

## 2021-10-06 DIAGNOSIS — Z09 Encounter for follow-up examination after completed treatment for conditions other than malignant neoplasm: Secondary | ICD-10-CM | POA: Insufficient documentation

## 2021-10-10 NOTE — Progress Notes (Signed)
Cheryl [name], Cheryl [name] ([id number]) Visit Report for 10/06/2021 Arrival Information Details Patient Name: Date of Service: Cheryl [name], Cheryl [name] Medical Record Number: [id number] Patient Account Number: [id number] Date of Birth/Sex: Treating RN: October 11, 1955 (66 y.o. Cheryl [name], Cheryl [name] Primary Care Cheryl [name]: Cheryl [name] Cheryl [name]: Referring Cheryl [name]: Treating Cheryl [name]/Extender: Cheryl [name] Weeks in Treatment: 7 Visit Information History Since Last Visit All ordered tests and consults were completed: Yes Patient Arrived: Ambulatory Added or deleted any medications: No Arrival Time: 15:11 Any new allergies or adverse reactions: No Accompanied By: self Had a fall or experienced change in No Transfer Assistance: None activities of daily living that may affect Patient Identification Verified: Yes risk of falls: Secondary Verification Process Completed: Yes Signs or symptoms of abuse/neglect since last visito No Patient Requires Transmission-Based Precautions: No Hospitalized since last visit: No Patient Has Alerts: No Implantable device outside of the clinic excluding No cellular tissue based products placed in the center since last visit: Has Dressing in Place as Prescribed: Yes Pain Present Now: No Electronic Signature(s) Signed: 10/10/2021 12:19:15 [name] By: Blanche East RN Entered By: Blanche East on 10/06/2021 15:13:10 -------------------------------------------------------------------------------- Clinic Level of Care Assessment Details Patient Name: Date of Service: Cheryl [name], Cheryl [name] 10/06/2021 3:15 [name] Medical Record Number: [id number] Patient Account Number: [id number] Date of Birth/Sex: Treating RN: [birth year] (66 y.o. Cheryl [name], [name] Primary Care Brettney Ficken: Cheryl [name] Cheryl [name]: Referring Demeco Ducksworth: Treating Isamar Wellbrock/Extender: Cheryl [name] Weeks in Treatment: 7 Clinic Level of Care Assessment Items TOOL 4 Quantity  Score X- 1 0 Use when only an EandM is performed on FOLLOW-UP visit ASSESSMENTS - Nursing Assessment / Reassessment X- 1 10 Reassessment of Co-morbidities (includes updates in patient status) []  - 0 Reassessment of Adherence to Treatment Plan ASSESSMENTS - Wound and Skin A ssessment / Reassessment X - Simple Wound Assessment / Reassessment - one wound 1 5 []  - 0 Complex Wound Assessment / Reassessment - multiple wounds []  - 0 Dermatologic / Skin Assessment (not related to wound area) ASSESSMENTS - Focused Assessment X- 1 5 Circumferential Edema Measurements - multi extremities []  - 0 Nutritional Assessment / Counseling / Intervention []  - 0 Lower Extremity Assessment (monofilament, tuning fork, pulses) []  - 0 Peripheral Arterial Disease Assessment (using hand held doppler) ASSESSMENTS - Ostomy and/or Continence Assessment and Care []  - 0 Incontinence Assessment and Management []  - 0 Ostomy Care Assessment and Management (repouching, etc.) PROCESS - Coordination of Care X - Simple Patient / Family Education for ongoing care 1 15 []  - 0 Complex (extensive) Patient / Family Education for ongoing care X- 1 10 Staff obtains Programmer, systems, Records, T Results / Process Orders est []  - 0 Staff telephones HHA, Nursing Homes / Clarify orders / etc []  - 0 Routine Transfer to another Facility (non-emergent condition) []  - 0 Routine Hospital Admission (non-emergent condition) []  - 0 New Admissions / Biomedical engineer / Ordering NPWT Apligraf, etc. , []  - 0 Emergency Hospital Admission (emergent condition) X- 1 10 Simple Discharge Coordination []  - 0 Complex (extensive) Discharge Coordination PROCESS - Special Needs []  - 0 Pediatric / Minor Patient Management []  - 0 Isolation Patient Management []  - 0 Hearing / Language / Visual special needs []  - 0 Assessment of Community assistance (transportation, D/C planning, etc.) []  - 0 Additional assistance / Altered  mentation []  - 0 Support Surface(s) Assessment (bed, cushion, seat, etc.) INTERVENTIONS - Wound Cleansing / Measurement X - Simple Wound Cleansing - one wound 1 5 []  -  0 Complex Wound Cleansing - multiple wounds []  - 0 Wound Imaging (photographs - any number of wounds) []  - 0 Wound Tracing (instead of photographs) X- 1 5 Simple Wound Measurement - one wound []  - 0 Complex Wound Measurement - multiple wounds INTERVENTIONS - Wound Dressings []  - 0 Small Wound Dressing one or multiple wounds X- 1 15 Medium Wound Dressing one or multiple wounds []  - 0 Large Wound Dressing one or multiple wounds []  - 0 Application of Medications - topical []  - 0 Application of Medications - injection INTERVENTIONS - Miscellaneous []  - 0 External ear exam []  - 0 Specimen Collection (cultures, biopsies, blood, body fluids, etc.) []  - 0 Specimen(s) / Culture(s) sent or taken to Lab for analysis []  - 0 Patient Transfer (multiple staff / / Similar devices) []  - 0 Simple Staple / Suture removal (25 or less) []  - 0 Complex Staple / Suture removal (26 or more) []  - 0 Hypo / Hyperglycemic Management (close monitor of Blood Glucose) []  - 0 Ankle / Brachial Index (ABI) - do not check if billed separately X- 1 5 Vital Signs Has the patient been seen at the hospital within the last three years: Yes Total Score: 85 Level Of Care: New/Established - Level 3 Electronic Signature(s) Signed: 10/10/2021 12:19:15 [name] By: RN Entered By: on 10/06/2021 15:39:48 -------------------------------------------------------------------------------- Encounter Discharge Information Details Patient Name: Date of Service: 10/06/2021 3:15 [name] Medical Record Number: Patient Account Number: Date of Birth/Sex: Treating RN: 1955-12-05 (66 y.o. Nurse, adult Primary Care Malin Cervini: Cheryl [name]: Referring Alianny Toelle: Treating  Rosaria Kubin/Extender: Weeks in Treatment: 7 Encounter Discharge Information Items Discharge Condition: Stable Ambulatory Status: Ambulatory Discharge Destination: Home Transportation: Ambulance Accompanied By: self Schedule Follow-up Appointment: No Clinical Summary of Care: Electronic Signature(s) Signed: 10/10/2021 12:19:15 [name] By: RN Entered By: 12/10/2021 on 10/06/2021 15:32:29 -------------------------------------------------------------------------------- Lower Extremity Assessment Details Patient Name: Date of Service: Cheryl [name], Cheryl [name] 10/06/2021 3:15 [name] Medical Record Number: [name] Patient Account Number: 12/06/2021 Date of Birth/Sex: Treating RN: 1955/03/07 (66 y.o. 06/28/1955 Primary Care Jazarah Capili: 77 Cheryl [name]: Referring Karelly Dewalt: Treating Mirtha Jain/Extender: Kateri Mc Weeks in Treatment: 7 Edema Assessment Assessed: [Left: No] [Right: No] Edema: [Left: Ye] [Right: s] Calf Left: Right: Point of Measurement: From Medial Instep 48 cm Ankle Left: Right: Point of Measurement: From Medial Instep 24 cm Vascular Assessment Pulses: Dorsalis Pedis Palpable: [Left:Yes] Electronic Signature(s) Signed: 10/10/2021 12:19:15 [name] By: Arvil Persons RN Entered By: 12/10/2021 on 10/06/2021 15:17:54 -------------------------------------------------------------------------------- Multi Wound Chart Details Patient Name: Date of Service: Cheryl [name] Ard 10/06/2021 3:15 [name] Medical Record Number: [name] Patient Account Number: 12/06/2021 Date of Birth/Sex: Treating RN: September 01, 1955 (66 y.o. 06/28/1955 Primary Care Ermal Haberer: 77 Cheryl [name]: Referring Aviyana Sonntag: Treating Leron Stoffers/Extender: Kateri Mc Weeks in Treatment: 7 Vital Signs Height(in): 67 Pulse(bpm): 74 Weight(lbs): 270 Blood Pressure(mmHg): 144/83 Body Mass Index(BMI):  42.3 Temperature(F): 97.7 Respiratory Rate(breaths/min): 18 Photos: [N/A:N/A] Left, Medial Foot N/A N/A Wound Location: Blister N/A N/A Wounding Event: Venous Leg Ulcer N/A N/A Primary Etiology: Anemia, Lymphedema, Hypertension, N/A N/A Comorbid History: Osteoarthritis 08/01/2021 N/A N/A Date Acquired: 7 N/A N/A Weeks of Treatment: Open N/A N/A Wound Status: No N/A N/A Wound Recurrence: 0x0x0 N/A N/A Measurements L x W x D (cm) 0 N/A N/A A (cm) : rea 0 N/A N/A Volume (cm) : 100.00%  N/A N/A % Reduction in Area: 100.00% N/A N/A % Reduction in Volume: Full Thickness Without Exposed N/A N/A Classification: Support Structures None Present N/A N/A Exudate Amount: Distinct, outline attached N/A N/A Wound Margin: Large (67-100%) N/A N/A Granulation Amount: Pink N/A N/A Granulation Quality: None Present (0%) N/A N/A Necrotic Amount: Fascia: No N/A N/A Exposed Structures: Fat Layer (Subcutaneous Tissue): No Tendon: No Muscle: No Joint: No Bone: No None N/A N/A Epithelialization: Treatment Notes Electronic Signature(s) Signed: 10/06/2021 3:28:20 [name] By: Fredirick Maudlin MD FACS Signed: 10/10/2021 12:19:15 [name] By: Blanche East RN Entered By: Fredirick Maudlin on 10/06/2021 15:28:20 -------------------------------------------------------------------------------- Multi-Disciplinary Care Plan Details Patient Name: Date of Service: JLYNN, HENGST 10/06/2021 3:15 [name] Medical Record Number: [id number] Patient Account Number: [id number] Date of Birth/Sex: Treating RN: May 26, 1955 (66 y.o. Cheryl [name], Cheryl [name] Primary Care Dalexa Gentz: Cheryl [name] Cheryl [name]: Referring Tyreece Gelles: Treating Najae Filsaime/Extender: Ronney Asters in Treatment: 7 Multidisciplinary Care Plan reviewed with physician Active Inactive Electronic Signature(s) Signed: 10/10/2021 12:19:15 [name] By: Blanche East RN Entered By: Blanche East on 10/06/2021  15:26:05 -------------------------------------------------------------------------------- Pain Assessment Details Patient Name: Date of Service: HAYLYNN, KEHN 10/06/2021 3:15 [name] Medical Record Number: [id number] Patient Account Number: [id number] Date of Birth/Sex: Treating RN: 1955/05/13 (66 y.o. Marta Lamas Primary Care Rosabell Geyer: Cheryl [name] Cheryl [name]: Referring Quasim Doyon: Treating Corinda Ammon/Extender: Cheryl [name] Weeks in Treatment: 7 Active Problems Location of Pain Severity and Description of Pain Patient Has Paino Yes Site Locations Rate the pain. Current Pain Level: 2 Pain Management and Medication Current Pain Management: Electronic Signature(s) Signed: 10/10/2021 12:19:15 [name] By: Blanche East RN Entered By: Blanche East on 10/06/2021 15:14:17 -------------------------------------------------------------------------------- Patient/Caregiver Education Details Patient Name: Date of Service: Alexander Mt 9/1/2023andnbsp3:15 [name] Medical Record Number: [id number] Patient Account Number: [id number] Date of Birth/Gender: Treating RN: 08-31-1955 (66 y.o. Marta Lamas Primary Care Physician: Cheryl [name] Cheryl [name]: Referring Physician: Treating Physician/Extender: Ronney Asters in Treatment: 7 Education Assessment Education Provided To: Patient Education Topics Provided Electronic Signature(s) Signed: 10/10/2021 12:19:15 [name] By: Blanche East RN Entered By: Blanche East on 10/06/2021 15:26:15 -------------------------------------------------------------------------------- Wound Assessment Details Patient Name: Date of Service: LOUEEN, ELM 10/06/2021 3:15 [name] Medical Record Number: [id number] Patient Account Number: [id number] Date of Birth/Sex: Treating RN: [birth year] (66 y.o. Cheryl [name], Cheryl [name] Primary Care Sundra Haddix: Cheryl [name] Cheryl [name]: Referring Juliann Olesky: Treating Viridiana Spaid/Extender:  Cheryl [name] Weeks in Treatment: 7 Wound Status Wound Number: 1 Primary Etiology: Venous Leg Ulcer Wound Location: Left, Medial Foot Wound Status: Open Wounding Event: Blister Comorbid History: Anemia, Lymphedema, Hypertension, Osteoarthritis Date Acquired: 08/01/2021 Weeks Of Treatment: 7 Clustered Wound: No Photos Wound Measurements Length: (cm) Width: (cm) Depth: (cm) Area: (cm) Volume: (cm) 0 % Reduction in Area: 100% 0 % Reduction in Volume: 100% 0 Epithelialization: None 0 Tunneling: No 0 Undermining: No Wound Description Classification: Full Thickness Without Exposed Support Structures Wound Margin: Distinct, outline attached Exudate Amount: None Present Foul Odor After Cleansing: No Slough/Fibrino No Wound Bed Granulation Amount: Large (67-100%) Exposed Structure Granulation Quality: Pink Fascia Exposed: No Necrotic Amount: None Present (0%) Fat Layer (Subcutaneous Tissue) Exposed: No Tendon Exposed: No Muscle Exposed: No Joint Exposed: No Bone Exposed: No Electronic Signature(s) Signed: 10/10/2021 12:19:15 [name] By: Blanche East RN Entered By: Blanche East on 10/06/2021 15:25:37 -------------------------------------------------------------------------------- Vitals Details Patient Name: Date of Service: Alexander Mt 10/06/2021 3:15 [name] Medical Record Number: [id number] Patient Account Number: [id number] Date of Birth/Sex: Treating  RN: [birth year] (66 y.o. Roselee Nova, Cheryl [name] Primary Care Severino Paolo: Barbie Banner Cheryl [name]: Referring Keileigh Vahey: Treating Wilman Tucker/Extender: Arvil Persons Weeks in Treatment: 7 Vital Signs Time Taken: 15:13 Temperature (F): 97.7 Height (in): 67 Pulse (bpm): 74 Weight (lbs): 270 Respiratory Rate (breaths/min): 18 Body Mass Index (BMI): 42.3 Blood Pressure (mmHg): 144/83 Reference Range: 80 - 120 mg / dl Electronic Signature(s) Signed: 10/10/2021 12:19:15 [name] By: Cheryl [name] Ard  RN Entered By: Cheryl [name] Ard on 10/06/2021 15:14:09

## 2021-10-10 NOTE — Progress Notes (Signed)
Cheryl [name], Cheryl [name] ([id number]) Visit Report for 10/06/2021 Chief Complaint Document Details Patient Name: Date of Service: Cheryl [name], Cheryl [name] 10/06/2021 3:15 PM Medical Record Number: [id number] Patient Account Number: [id number] Date of Birth/Sex: Treating RN: 15-Apr-1955 (66 y.o. Kateri Mc Primary Care Provider: Barbie Banner Other Clinician: Referring Provider: Treating Provider/Extender: Arvil Persons Weeks in Treatment: 7 Information Obtained from: Patient Chief Complaint Patient presents for treatment of an open ulcer due to venous insufficiency Electronic Signature(s) Signed: 10/06/2021 3:28:26 PM By: Duanne Guess MD FACS Entered By: Duanne Guess on 10/06/2021 15:28:26 -------------------------------------------------------------------------------- HPI Details Patient Name: Date of Service: Cheryl [name], Cheryl [name] 10/06/2021 3:15 PM Medical Record Number: [id number] Patient Account Number: [id number] Date of Birth/Sex: Treating RN: 01/11/1956 (66 y.o. Kateri Mc Primary Care Provider: Barbie Banner Other Clinician: Referring Provider: Treating Provider/Extender: Arvil Persons Weeks in Treatment: 7 History of Present Illness HPI Description: ADMISSION 08/15/2021 This is a 66 year old non-smoker, nondiabetic, with a past medical history significant for significant venous insufficiency and reflux. She has previously undergone bilateral saphenous vein ablations. She wears compression stockings on a regular basis. About 2 weeks ago, she developed a blister on her left medial ankle. She thought perhaps it had rubbed on her shoes. As it became more painful and red, she was seen in the emergency department. She was diagnosed with cellulitis and prescribed doxycycline. She was referred to the wound care center for further evaluation and management. She has a geographic cluster of small open wounds on her right medial ankle, just below the  malleolus. There is no erythema or induration associated with the wounds. No purulent drainage or odor. They are fairly sensitive. 08/23/2021: The cluster of wounds is smaller today. They are more superficial and have just a little bit of accumulated slough in each of the tiny pockmarks. 09/01/2021: The wounds continue to contract. Pain is less than on previous visits. There is light slough accumulation in each of the open pockmark wounds. 09/14/2021: The majority of her wounds are closed. There are just a couple of areas remaining at the most proximal portion of the geographic cluster of wounds. There was some eschar overlying these. It does look like her foot has been rubbing on something as the skin at the end of the wounded area near her toes looks a bit raw. It does not appear to be frankly open at this time. She says she feels like the foam border dressing slides around in her footwear. 09/20/2021: We are down to just 2 small open areas. They are superficial and flush with the surrounding skin surface. Just a little bit of eschar and biofilm present. 09/29/2021: There are 2 areas that appeared open, but after removing eschar from one site, it was found to be healed. The remaining open area has a little bit of eschar and slough without any concern for infection. 10/06/2021: Her wounds are healed. Electronic Signature(s) Signed: 10/06/2021 3:28:38 PM By: Duanne Guess MD FACS Entered By: Duanne Guess on 10/06/2021 15:28:38 -------------------------------------------------------------------------------- Physical Exam Details Patient Name: Date of Service: Cheryl [name], Cheryl [name] 10/06/2021 3:15 PM Medical Record Number: [id number] Patient Account Number: [id number] Date of Birth/Sex: Treating RN: Jul 24, 1955 (66 y.o. Kateri Mc Primary Care Provider: Barbie Banner Other Clinician: Referring Provider: Treating Provider/Extender: Arvil Persons Weeks in Treatment:  7 Constitutional Slightly hypertensive. . . . No acute distress.Marland Kitchen Respiratory Normal work of breathing on room air.. Notes 10/06/2021: Her wounds are healed. Electronic Signature(s) Signed:  10/06/2021 3:29:09 PM By: Duanne Guess MD FACS Entered By: Duanne Guess on 10/06/2021 15:29:09 -------------------------------------------------------------------------------- Physician Orders Details Patient Name: Date of Service: Cheryl [name], Cheryl [name] 10/06/2021 3:15 PM Medical Record Number: [id number] Patient Account Number: [id number] Date of Birth/Sex: Treating RN: 1955-03-31 (66 y.o. Kateri Mc Primary Care Provider: Barbie Banner Other Clinician: Referring Provider: Treating Provider/Extender: Arvil Persons Weeks in Treatment: 7 Verbal / Phone Orders: No Diagnosis Coding ICD-10 Coding Code Description [phone] Non-pressure chronic ulcer of left ankle with fat layer exposed I87.2 Venous insufficiency (chronic) (peripheral) F81.829 Varicose veins of bilateral lower extremities with other complications Discharge From [facility] Services Discharge from Wound Care Center - Congratulations!! Bathing/ Shower/ Hygiene May shower and wash wound with soap and water. Edema Control - Lymphedema / SCD / Other Elevate legs to the level of the heart or above for 30 minutes daily and/or when sitting, a frequency of: Avoid standing for long periods of time. Patient to wear own compression stockings every day. Exercise regularly Moisturize legs daily. Electronic Signature(s) Signed: 10/06/2021 3:29:20 PM By: Duanne Guess MD FACS Entered By: Duanne Guess on 10/06/2021 15:29:19 -------------------------------------------------------------------------------- Problem List Details Patient Name: Date of Service: Cheryl [name], Cheryl [name] 10/06/2021 3:15 PM Medical Record Number: [id number] Patient Account Number: [id number] Date of Birth/Sex: Treating RN: 1955/12/31 (66 y.o. Kateri Mc Primary Care Provider: Barbie Banner Other Clinician: Referring Provider: Treating Provider/Extender: Arvil Persons Weeks in Treatment: 7 Active Problems ICD-10 Encounter Code Description Active Date MDM Diagnosis [phone] Non-pressure chronic ulcer of left ankle with fat layer exposed 08/15/2021 No Yes I87.2 Venous insufficiency (chronic) (peripheral) 08/15/2021 No Yes I83.893 Varicose veins of bilateral lower extremities with other complications 08/15/2021 No Yes Inactive Problems Resolved Problems Electronic Signature(s) Signed: 10/06/2021 3:28:16 PM By: Duanne Guess MD FACS Entered By: Duanne Guess on 10/06/2021 15:28:15 -------------------------------------------------------------------------------- Progress Note Details Patient Name: Date of Service: Cheryl [name] 10/06/2021 3:15 PM Medical Record Number: [id number] Patient Account Number: [id number] Date of Birth/Sex: Treating RN: January 24, 1956 (66 y.o. Kateri Mc Primary Care Provider: Barbie Banner Other Clinician: Referring Provider: Treating Provider/Extender: Arvil Persons Weeks in Treatment: 7 Subjective Chief Complaint Information obtained from Patient Patient presents for treatment of an open ulcer due to venous insufficiency History of Present Illness (HPI) ADMISSION 08/15/2021 This is a 66 year old non-smoker, nondiabetic, with a past medical history significant for significant venous insufficiency and reflux. She has previously undergone bilateral saphenous vein ablations. She wears compression stockings on a regular basis. About 2 weeks ago, she developed a blister on her left medial ankle. She thought perhaps it had rubbed on her shoes. As it became more painful and red, she was seen in the emergency department. She was diagnosed with cellulitis and prescribed doxycycline. She was referred to the wound care center for further evaluation and management. She  has a geographic cluster of small open wounds on her right medial ankle, just below the malleolus. There is no erythema or induration associated with the wounds. No purulent drainage or odor. They are fairly sensitive. 08/23/2021: The cluster of wounds is smaller today. They are more superficial and have just a little bit of accumulated slough in each of the tiny pockmarks. 09/01/2021: The wounds continue to contract. Pain is less than on previous visits. There is light slough accumulation in each of the open pockmark wounds. 09/14/2021: The majority of her wounds are closed. There are just a couple of areas remaining at the most  proximal portion of the geographic cluster of wounds. There was some eschar overlying these. It does look like her foot has been rubbing on something as the skin at the end of the wounded area near her toes looks a bit raw. It does not appear to be frankly open at this time. She says she feels like the foam border dressing slides around in her footwear. 09/20/2021: We are down to just 2 small open areas. They are superficial and flush with the surrounding skin surface. Just a little bit of eschar and biofilm present. 09/29/2021: There are 2 areas that appeared open, but after removing eschar from one site, it was found to be healed. The remaining open area has a little bit of eschar and slough without any concern for infection. 10/06/2021: Her wounds are healed. Patient History Information obtained from Patient. Family History Cancer - Mother,Siblings, Heart Disease - Father, Hypertension - Father, Stroke - Siblings, No family history of Diabetes, Hereditary Spherocytosis, Kidney Disease, Lung Disease, Seizures, Thyroid Problems, Tuberculosis. Social History Never smoker, Marital Status - Divorced, Alcohol Use - Never, Drug Use - No History, Caffeine Use - Daily. Medical History Hematologic/Lymphatic Patient has history of Anemia, Lymphedema Cardiovascular Patient has  history of Hypertension Musculoskeletal Patient has history of Osteoarthritis Hospitalization/Surgery History - endovenous ablation saphenous vein with laser. - breast biopsy. - cholecystectomy. - skin graft. Medical A Surgical History Notes nd Constitutional Symptoms (General Health) obesity Hematologic/Lymphatic Grave's disease Cardiovascular varicose veins, pericarditis Objective Constitutional Slightly hypertensive. No acute distress.. Vitals Time Taken: 3:13 PM, Height: 67 in, Weight: 270 lbs, BMI: 42.3, Temperature: 97.7 F, Pulse: 74 bpm, Respiratory Rate: 18 breaths/min, Blood Pressure: 144/83 mmHg. Respiratory Normal work of breathing on room air.. General Notes: 10/06/2021: Her wounds are healed. Integumentary (Hair, Skin) Wound #1 status is Open. Original cause of wound was Blister. The date acquired was: 08/01/2021. The wound has been in treatment 7 weeks. The wound is located on the Left,Medial Foot. The wound measures 0cm length x 0cm width x 0cm depth; 0cm^2 area and 0cm^3 volume. There is no tunneling or undermining noted. There is a none present amount of drainage noted. The wound margin is distinct with the outline attached to the wound base. There is large (67-100%) pink granulation within the wound bed. There is no necrotic tissue within the wound bed. Assessment Active Problems ICD-10 Non-pressure chronic ulcer of left ankle with fat layer exposed Venous insufficiency (chronic) (peripheral) Varicose veins of bilateral lower extremities with other complications Plan Discharge From [facility] Services: Discharge from Wound Care Center - Congratulations!! Bathing/ Shower/ Hygiene: May shower and wash wound with soap and water. Edema Control - Lymphedema / SCD / Other: Elevate legs to the level of the heart or above for 30 minutes daily and/or when sitting, a frequency of: Avoid standing for long periods of time. Patient to wear own compression stockings every  day. Exercise regularly Moisturize legs daily. 10/06/2021: Her wounds are healed. We will discharge her from the wound care center. She was reminded to wear compression stockings regularly. Follow-up as needed. Electronic Signature(s) Signed: 10/06/2021 3:29:41 PM By: Duanne Guess MD FACS Entered By: Duanne Guess on 10/06/2021 15:29:41 -------------------------------------------------------------------------------- HxROS Details Patient Name: Date of Service: Cheryl [name], Cheryl [name] 10/06/2021 3:15 PM Medical Record Number: [id number] Patient Account Number: [id number] Date of Birth/Sex: Treating RN: [birth year] (66 y.o. Roselee Nova, Jamie Primary Care Provider: Barbie Banner Other Clinician: Referring Provider: Treating Provider/Extender: Arvil Persons Weeks in Treatment: 7 Information Obtained  From Patient Constitutional Symptoms (General Health) Medical History: Past Medical History Notes: obesity Hematologic/Lymphatic Medical History: Positive for: Anemia; Lymphedema Past Medical History Notes: Grave's disease Cardiovascular Medical History: Positive for: Hypertension Past Medical History Notes: varicose veins, pericarditis Musculoskeletal Medical History: Positive for: Osteoarthritis Immunizations Pneumococcal Vaccine: Received Pneumococcal Vaccination: Yes Received Pneumococcal Vaccination On or After 60th Birthday: Yes Implantable Devices None Hospitalization / Surgery History Type of Hospitalization/Surgery endovenous ablation saphenous vein with laser breast biopsy cholecystectomy skin graft Family and Social History Cancer: Yes - Mother,Siblings; Diabetes: No; Heart Disease: Yes - Father; Hereditary Spherocytosis: No; Hypertension: Yes - Father; Kidney Disease: No; Lung Disease: No; Seizures: No; Stroke: Yes - Siblings; Thyroid Problems: No; Tuberculosis: No; Never smoker; Marital Status - Divorced; Alcohol Use: Never; Drug Use: No History;  Caffeine Use: Daily; Financial Concerns: No; Food, Clothing or Shelter Needs: No; Support System Lacking: No; Transportation Concerns: No Psychologist, prison and probation services) Signed: 10/06/2021 4:29:30 PM By: Duanne Guess MD FACS Signed: 10/10/2021 12:19:15 PM By: Tommie Ard RN Entered By: Duanne Guess on 10/06/2021 15:28:44 -------------------------------------------------------------------------------- SuperBill Details Patient Name: Date of Service: Cheryl [name], Cheryl [name] 10/06/2021 Medical Record Number: [id number] Patient Account Number: [id number] Date of Birth/Sex: Treating RN: 1955-09-18 (66 y.o. Kateri Mc Primary Care Provider: Barbie Banner Other Clinician: Referring Provider: Treating Provider/Extender: Arvil Persons Weeks in Treatment: 7 Diagnosis Coding ICD-10 Codes Code Description [phone] Non-pressure chronic ulcer of left ankle with fat layer exposed I87.2 Venous insufficiency (chronic) (peripheral) B20.100 Varicose veins of bilateral lower extremities with other complications Facility Procedures CPT4 Code: [id number] Description: 99213 - WOUND CARE VISIT-LEV 3 EST PT Modifier: Quantity: 1 Physician Procedures : CPT4 Code Description Modifier [id number] - WC PHYS LEVEL 3 - EST PT ICD-10 Diagnosis Description L97.322 Non-pressure chronic ulcer of left ankle with fat layer exposed I87.2 Venous insufficiency (chronic) (peripheral) Y64.158 Varicose veins of  bilateral lower extremities with other complications Quantity: 1 Electronic Signature(s) Signed: 10/06/2021 4:29:30 PM By: Duanne Guess MD FACS Signed: 10/10/2021 12:19:15 PM By: Tommie Ard RN Previous Signature: 10/06/2021 3:29:54 PM Version By: Duanne Guess MD FACS Entered By: Tommie Ard on 10/06/2021 15:40:12

## 2022-07-17 ENCOUNTER — Other Ambulatory Visit: Payer: Self-pay | Admitting: Family Medicine

## 2022-07-17 DIAGNOSIS — Z1231 Encounter for screening mammogram for malignant neoplasm of breast: Secondary | ICD-10-CM

## 2022-08-27 ENCOUNTER — Ambulatory Visit
Admission: RE | Admit: 2022-08-27 | Discharge: 2022-08-27 | Disposition: A | Discharge status: Home / Self Care | Payer: Medicare Other | Source: Ambulatory Visit | Attending: Family Medicine | Admitting: Family Medicine

## 2022-08-27 DIAGNOSIS — Z1231 Encounter for screening mammogram for malignant neoplasm of breast: Secondary | ICD-10-CM

## 2023-02-18 ENCOUNTER — Ambulatory Visit: Payer: BC Managed Care – PPO | Admitting: Podiatry

## 2023-02-28 ENCOUNTER — Ambulatory Visit (INDEPENDENT_AMBULATORY_CARE_PROVIDER_SITE_OTHER): Payer: BC Managed Care – PPO | Admitting: Podiatry

## 2023-02-28 ENCOUNTER — Encounter: Payer: Self-pay | Admitting: Podiatry

## 2023-02-28 DIAGNOSIS — Z79899 Other long term (current) drug therapy: Secondary | ICD-10-CM | POA: Diagnosis not present

## 2023-02-28 DIAGNOSIS — B351 Tinea unguium: Secondary | ICD-10-CM | POA: Diagnosis not present

## 2023-02-28 NOTE — Patient Instructions (Signed)

## 2023-02-28 NOTE — Progress Notes (Signed)
Subjective:   Patient ID: Cheryl [name], female   DOB: 68 y.o.   MRN: [id number]   HPI Chief Complaint  Patient presents with   Nail Problem    RM#14 Bilateral nail fungus patient states it has been going on quite sometime.   68 year old female presents the office with above concerns.  She said that she has had fungus for quite some time.  Her primary care doctor been trimming her toenails.  She said that 1 point she thinks that she was on a pill for fungus and she is also using cream.  The cream has not been helping.  She does get occasional discomfort to her nails most of the left big toenail.  No swelling or redness or any drainage.  No other concerns.   Review of Systems  All other systems reviewed and are negative.   Past Medical History:  Diagnosis Date   Anemia    Chest pain    Chronic dermatitis    Edema 06/15/2010   Edema of both legs    Grave's disease    HLD (hyperlipidemia)    HTN (hypertension)    Leg edema 09/02/2012   Obesity    Pericarditis 10/05/2010   Thyroid disease 06/15/2010   Varicose veins    Varicose veins of leg with complications 08/10/2014   Varicose veins of lower extremities with other complications 09/02/2012    Past Surgical History:  Procedure Laterality Date   BREAST BIOPSY Bilateral    benign pt does not recall dates   CHOLECYSTECTOMY     ENDOVENOUS ABLATION SAPHENOUS VEIN W/ LASER     ENDOVENOUS ABLATION SAPHENOUS VEIN W/ LASER Left 04/21/2020   endovenous laser ablation left greater saphenous vein/anterior accessory branch by Cari Caraway MD    SKIN GRAFT       Current Outpatient Medications:    acetaminophen (TYLENOL) 500 MG tablet, Take 2 tablets (1,000 mg total) by mouth every 6 (six) hours as needed., Disp: 30 tablet, Rfl: 0   doxycycline (VIBRAMYCIN) 100 MG capsule, Take 1 capsule (100 mg total) by mouth 2 (two) times daily., Disp: 20 capsule, Rfl: 0   furosemide (LASIX) 20 MG tablet, Take 1 tablet (20 mg total) by mouth daily.,  Disp: 5 tablet, Rfl: 0   ibuprofen (ADVIL,MOTRIN) 200 MG tablet, Take 200 mg by mouth every 6 (six) hours as needed. As needed, when not taking Meloxicam, Disp: , Rfl:    levothyroxine (SYNTHROID, LEVOTHROID) 175 MCG tablet, Take 175 mcg by mouth daily. , Disp: , Rfl: 5   phentermine (ADIPEX-P) 37.5 MG tablet, Take 37.5 mg by mouth daily., Disp: , Rfl: 2   potassium chloride (KLOR-CON) 10 MEQ tablet, Take 1 tablet (10 mEq total) by mouth daily., Disp: 5 tablet, Rfl: 0  Allergies  Allergen Reactions   Antihistamines, Chlorpheniramine-Type    Simvastatin Other (See Comments)    Made her feel bad   Terbinafine Other (See Comments)    Made her skin gel-up          Objective:  Physical Exam  General: AAO x3, NAD  Dermatological: The nails are all hypertrophic, dystrophic with yellow discoloration and some old debris is present.  There is no edema, erythema or signs of infection.  No open lesions.  Vascular: Dorsalis Pedis artery and Posterior Tibial artery pedal pulses are 2/4 bilateral with immedate capillary fill time.  There is no pain with calf compression, swelling, warmth, erythema.   Neruologic: Grossly intact via light touch bilateral. .  Musculoskeletal: She has some tenderness to the toenails most of the left hallux toenail.  No area pinpoint tenderness.      Assessment:   Onychomycosis     Plan:  -Treatment options discussed including all alternatives, risks, and complications -Etiology of symptoms were discussed -As a courtesy debride the nails were any complications or bleeding.  In regards to the nail fungus we discussed different treatment options about oral treatment, topical treatment as well as alternatives including laser as well.  We did discuss nail removal particular left hallux toenail given discomfort.  Organ to try to treat the nail and if no improvement will then proceed with removal.  Discussed options and she was to proceed with oral Lamisil.  Will  check CBC and LFT prior to starting the medication.  Discussed side effects and to stop immediately should any occur.  Vivi Barrack DPM

## 2023-03-01 ENCOUNTER — Encounter: Payer: Self-pay | Admitting: Podiatry

## 2023-03-01 ENCOUNTER — Other Ambulatory Visit: Payer: Self-pay | Admitting: Podiatry

## 2023-03-01 DIAGNOSIS — Z79899 Other long term (current) drug therapy: Secondary | ICD-10-CM

## 2023-03-01 LAB — HEPATIC FUNCTION PANEL
ALT: 13 [IU]/L (ref 0–32)
AST: 12 [IU]/L (ref 0–40)
Albumin: 4.4 g/dL (ref 3.9–4.9)
Alkaline Phosphatase: 97 [IU]/L (ref 44–121)
Bilirubin Total: 0.4 mg/dL (ref 0.0–1.2)
Bilirubin, Direct: 0.14 mg/dL (ref 0.00–0.40)
Total Protein: 6.2 g/dL (ref 6.0–8.5)

## 2023-03-01 LAB — CBC WITH DIFFERENTIAL/PLATELET
Basophils Absolute: 0.1 10*3/uL (ref 0.0–0.2)
Basos: 1 %
EOS (ABSOLUTE): 0.1 10*3/uL (ref 0.0–0.4)
Eos: 1 %
Hematocrit: 38.4 % (ref 34.0–46.6)
Hemoglobin: 12.7 g/dL (ref 11.1–15.9)
Immature Grans (Abs): 0 10*3/uL (ref 0.0–0.1)
Immature Granulocytes: 0 %
Lymphocytes Absolute: 2.1 10*3/uL (ref 0.7–3.1)
Lymphs: 32 %
MCH: 30 pg (ref 26.6–33.0)
MCHC: 33.1 g/dL (ref 31.5–35.7)
MCV: 91 fL (ref 79–97)
Monocytes Absolute: 0.4 10*3/uL (ref 0.1–0.9)
Monocytes: 6 %
Neutrophils Absolute: 3.9 10*3/uL (ref 1.4–7.0)
Neutrophils: 60 %
Platelets: 240 10*3/uL (ref 150–450)
RBC: 4.23 x10E6/uL (ref 3.77–5.28)
RDW: 13.2 % (ref 11.7–15.4)
WBC: 6.5 10*3/uL (ref 3.4–10.8)

## 2023-03-01 MED ORDER — FLUCONAZOLE 150 MG PO TABS: 150.0000 mg | ORAL_TABLET | ORAL | 0 refills | Status: DC

## 2023-03-16 ENCOUNTER — Encounter (HOSPITAL_COMMUNITY): Payer: Self-pay | Admitting: *Deleted

## 2023-03-16 ENCOUNTER — Emergency Department (HOSPITAL_COMMUNITY)
Admission: EM | Admit: 2023-03-16 | Discharge: 2023-03-17 | Disposition: A | Discharge status: Home / Self Care | Payer: BC Managed Care – PPO | Attending: Emergency Medicine | Admitting: Emergency Medicine

## 2023-03-16 ENCOUNTER — Other Ambulatory Visit: Payer: Self-pay

## 2023-03-16 ENCOUNTER — Emergency Department (HOSPITAL_COMMUNITY): Payer: BC Managed Care – PPO

## 2023-03-16 DIAGNOSIS — Z20822 Contact with and (suspected) exposure to covid-19: Secondary | ICD-10-CM | POA: Insufficient documentation

## 2023-03-16 DIAGNOSIS — R030 Elevated blood-pressure reading, without diagnosis of hypertension: Secondary | ICD-10-CM | POA: Diagnosis not present

## 2023-03-16 DIAGNOSIS — R110 Nausea: Secondary | ICD-10-CM

## 2023-03-16 DIAGNOSIS — R42 Dizziness and giddiness: Secondary | ICD-10-CM | POA: Diagnosis present

## 2023-03-16 DIAGNOSIS — R112 Nausea with vomiting, unspecified: Secondary | ICD-10-CM | POA: Insufficient documentation

## 2023-03-16 LAB — BASIC METABOLIC PANEL
Anion gap: 8 (ref 5–15)
BUN: 10 mg/dL (ref 8–23)
CO2: 25 mmol/L (ref 22–32)
Calcium: 8.9 mg/dL (ref 8.9–10.3)
Chloride: 107 mmol/L (ref 98–111)
Creatinine, Ser: 0.58 mg/dL (ref 0.44–1.00)
GFR, Estimated: >60 mL/min (ref >60)
Glucose, Bld: 112 mg/dL — ABNORMAL HIGH (ref 70–99)
Potassium: 3.9 mmol/L (ref 3.5–5.1)
Sodium: 140 mmol/L (ref 135–145)

## 2023-03-16 LAB — URINALYSIS, ROUTINE W REFLEX MICROSCOPIC
Bilirubin Urine: NEGATIVE
Glucose, UA: NEGATIVE mg/dL
Hgb urine dipstick: NEGATIVE
Ketones, ur: NEGATIVE mg/dL
Leukocytes,Ua: NEGATIVE
Nitrite: NEGATIVE
Protein, ur: NEGATIVE mg/dL
Specific Gravity, Urine: 1.012 (ref 1.005–1.030)
pH: 7 (ref 5.0–8.0)

## 2023-03-16 LAB — CBC
HCT: 36.8 % (ref 36.0–46.0)
Hemoglobin: 12.5 g/dL (ref 12.0–15.0)
MCH: 29.9 pg (ref 26.0–34.0)
MCHC: 34 g/dL (ref 30.0–36.0)
MCV: 88 fL (ref 80.0–100.0)
Platelets: 230 10*3/uL (ref 150–400)
RBC: 4.18 MIL/uL (ref 3.87–5.11)
RDW: 13.1 % (ref 11.5–15.5)
WBC: 7.3 10*3/uL (ref 4.0–10.5)
nRBC: 0 % (ref 0.0–0.2)

## 2023-03-16 LAB — TROPONIN I (HIGH SENSITIVITY): Troponin I (High Sensitivity): 7 ng/L (ref <18)

## 2023-03-16 MED ORDER — ONDANSETRON 4 MG PO TBDP
8.0000 mg | ORAL_TABLET | Freq: Once | ORAL | Status: AC
  Administered 2023-03-16: 8 mg via ORAL
  Filled 2023-03-16: qty 2

## 2023-03-16 MED ORDER — AMLODIPINE BESYLATE 5 MG PO TABS
5.0000 mg | ORAL_TABLET | Freq: Once | ORAL | Status: AC
  Administered 2023-03-16: 5 mg via ORAL
  Filled 2023-03-16: qty 1

## 2023-03-16 NOTE — ED Provider Notes (Signed)
 [facility] Emergency Department Provider Note MRN:  [id number]  Arrival date & time: 03/17/23     Chief Complaint   Dizziness   History of Present Illness   Cheryl [name] is a 68 y.o. year-old female presents to the ED with chief complaint of dizziness.  She describes the dizziness as feeling like she was going to pass out.  She also had some associated nausea.  Onset was today at 1 PM.  She was seen at urgent care and noted to have significantly elevated blood pressure at 200s over 80s.  It was sent to the emergency department for further evaluation potential stroke.  Patient states that she did have some nausea and vomiting earlier.  She denies having any pain now.  She states that her dizziness has improved.  She denies taking any blood pressure medicines.  Denies any numbness, weakness, tingling.  Denies vision changes or speech changes..  History provided by patient.   Review of Systems  Pertinent positive and negative review of systems noted in HPI.    Physical Exam   Vitals:   03/16/23 2243 03/17/23 0026  BP: (!) 205/88 (!) 188/118  Pulse: 69 65  Resp: 17 16  Temp: 98.1 F (36.7 C)   SpO2: 99% 100%    CONSTITUTIONAL:  non toxic-appearing, NAD NEURO:  Alert and oriented x 3, CN 3-12 grossly intact, normal finger to nose, normal gait, no pronator drift EYES:  eyes equal and reactive ENT/NECK:  Supple, no stridor  CARDIO:  normal rate, regular rhythm, appears well-perfused  PULM:  No respiratory distress, CTAB GI/GU:  non-distended,  MSK/SPINE:  No gross deformities, no edema, moves all extremities  SKIN:  no rash, atraumatic   *Additional and/or pertinent findings included in MDM below  Diagnostic and Interventional Summary    EKG Interpretation Date/Time:  Saturday March 16 2023 21:44:57 EST Ventricular Rate:  68 PR Interval:  158 QRS Duration:  92 QT Interval:  420 QTC Calculation: 446 R Axis:   18  Text  Interpretation: Normal sinus rhythm Low voltage QRS Borderline ECG When compared with ECG of 26-Apr-2019 19:02, PREVIOUS ECG IS PRESENT No significant change was found Confirmed by Trine Likes [phone]) on 03/17/2023 1:16:12 AM       Labs Reviewed  BASIC METABOLIC PANEL - Abnormal; Notable for the following components:      Result Value   Glucose, Bld 112 (*)    All other components within normal limits  RESP PANEL BY RT-PCR (RSV, FLU A&B, COVID)  RVPGX2  CBC  URINALYSIS, ROUTINE W REFLEX MICROSCOPIC  TROPONIN I (HIGH SENSITIVITY)  TROPONIN I (HIGH SENSITIVITY)    CT HEAD WO CONTRAST ( )  Final Result    DG Chest 2 View  Final Result      Medications  ondansetron  (ZOFRAN -ODT) disintegrating tablet 8 mg (8 mg Oral Given 03/16/23 2118)  amLODipine  (NORVASC ) tablet 5 mg (5 mg Oral Given 03/16/23 2329)     Procedures  /  Critical Care Procedures  ED Course and Medical Decision Making  I have reviewed the triage vital signs, the nursing notes, and pertinent available records from the EMR.  Social Determinants Affecting Complexity of Care: Patient has no clinically significant social determinants affecting this chief complaint..   ED Course:    Medical Decision Making Patient sent in from urgent care with dizziness and nausea.  She states that she felt like she was going pass out.  She went to urgent care and was  found to have significantly elevated blood pressures.  She was sent to the ER over concern for dizziness and possible stroke.    On my exam, she does not exhibit any ataxia.  She is not currently dizzy.  She does not have any neurovascular deficits.  She has good sensation and strength in her extremities.  No slurred speech.  Patient was noted to have elevated blood pressures here, gave her a dose of amlodipine .  She is not currently taking any blood pressure medicine location at home.  Her medication list phentermine no and Lasix , but she states that she has not taken  these.  She has not been on any other medications other than Diflucan  and Synthroid.  She has had elevated blood pressures she had in the past up to the 180s.  With reassuring labs, normal head CT, and no present dizziness, I feel the patient can be safely discharged.  Patient did state that earlier when she turned her head side-to-side she experienced some dizziness, but she did not have any horizontal nystagmus on my exam.  Vertigo is also considered.  But she is able to walk without difficulty, she is not having any additional nausea or vomiting.  I feel that the patient can be discharged and follow-up with her primary care doctor.  I will defer hypertension management to them.  She states that she should be able to be seen by her doctor shortly.  I discussed the case with Dr. Trine, who agrees with plan.  Amount and/or Complexity of Data Reviewed Radiology: ordered.  Risk Prescription drug management.         Consultants: No consultations were needed in caring for this patient.   Treatment and Plan: I considered admission due to patient's initial presentation, but after considering the examination and diagnostic results, patient will not require admission and can be discharged with outpatient follow-up.  Patient discussed with attending physician, Dr. Trine, who agrees with plan for outpatient follow-up.  Final Clinical Impressions(s) / ED Diagnoses     ICD-10-CM   1. Lightheadedness  R42     2. Nausea  R11.0     3. Elevated blood pressure reading  R03.0       ED Discharge Orders     None         Discharge Instructions Discussed with and Provided to Patient:     Discharge Instructions      Please follow-up with your primary care doctor and have them recheck your blood pressure.  You may need to start blood pressure medication.  Return to the ER for new or worsening symptoms.    Dial 911 for slurred speech, numbness/weakness in your extremities, or  vision changes.       Vicky Charleston, PA-C 03/17/23 0150    Trine Raynell Moder, MD 03/17/23 [phone]

## 2023-03-16 NOTE — ED Triage Notes (Signed)
 The pt [facility]  she has had dizziness since this am  with nausea and vomiting

## 2023-03-17 ENCOUNTER — Emergency Department (HOSPITAL_COMMUNITY): Payer: BC Managed Care – PPO

## 2023-03-17 DIAGNOSIS — R42 Dizziness and giddiness: Secondary | ICD-10-CM | POA: Diagnosis not present

## 2023-03-17 LAB — RESP PANEL BY RT-PCR (RSV, FLU A&B, COVID)  RVPGX2
Influenza A by PCR: NEGATIVE
Influenza B by PCR: NEGATIVE
Resp Syncytial Virus by PCR: NEGATIVE
SARS Coronavirus 2 by RT PCR: NEGATIVE

## 2023-03-17 LAB — TROPONIN I (HIGH SENSITIVITY): Troponin I (High Sensitivity): 7 ng/L (ref <18)

## 2023-03-17 NOTE — Discharge Instructions (Signed)
 Please follow-up with your primary care doctor and have them recheck your blood pressure.  You may need to start blood pressure medication.  Return to the ER for new or worsening symptoms.    Dial 911 for slurred speech, numbness/weakness in your extremities, or vision changes.

## 2023-04-26 ENCOUNTER — Other Ambulatory Visit (HOSPITAL_BASED_OUTPATIENT_CLINIC_OR_DEPARTMENT_OTHER): Payer: Self-pay | Admitting: Family Medicine

## 2023-04-26 DIAGNOSIS — R0609 Other forms of dyspnea: Secondary | ICD-10-CM

## 2023-04-26 DIAGNOSIS — D649 Anemia, unspecified: Secondary | ICD-10-CM

## 2023-04-26 DIAGNOSIS — I779 Disorder of arteries and arterioles, unspecified: Secondary | ICD-10-CM

## 2023-05-28 ENCOUNTER — Ambulatory Visit (HOSPITAL_BASED_OUTPATIENT_CLINIC_OR_DEPARTMENT_OTHER)
Admission: RE | Admit: 2023-05-28 | Discharge: 2023-05-28 | Disposition: A | Discharge status: Home / Self Care | Source: Ambulatory Visit | Attending: Cardiology | Admitting: Cardiology

## 2023-05-28 ENCOUNTER — Ambulatory Visit (HOSPITAL_COMMUNITY)
Admission: RE | Admit: 2023-05-28 | Discharge: 2023-05-28 | Disposition: A | Discharge status: Home / Self Care | Source: Ambulatory Visit | Attending: Cardiology | Admitting: Cardiology

## 2023-05-28 DIAGNOSIS — D649 Anemia, unspecified: Secondary | ICD-10-CM | POA: Insufficient documentation

## 2023-05-28 DIAGNOSIS — R600 Localized edema: Secondary | ICD-10-CM

## 2023-05-28 DIAGNOSIS — R0609 Other forms of dyspnea: Secondary | ICD-10-CM | POA: Insufficient documentation

## 2023-05-28 DIAGNOSIS — I779 Disorder of arteries and arterioles, unspecified: Secondary | ICD-10-CM

## 2023-05-30 ENCOUNTER — Encounter: Payer: Self-pay | Admitting: Podiatry

## 2023-05-30 ENCOUNTER — Ambulatory Visit (INDEPENDENT_AMBULATORY_CARE_PROVIDER_SITE_OTHER): Payer: BC Managed Care – PPO | Admitting: Podiatry

## 2023-05-30 DIAGNOSIS — M21619 Bunion of unspecified foot: Secondary | ICD-10-CM | POA: Diagnosis not present

## 2023-05-30 DIAGNOSIS — B351 Tinea unguium: Secondary | ICD-10-CM | POA: Diagnosis not present

## 2023-05-30 NOTE — Patient Instructions (Addendum)
 Bunion: What to Know A bunion, or hallux valgus, is a bump that forms slowly on the inner side of your big toe joint. It happens when your big toe turns toward your second toe. Bunions may be small at first but get bigger over time. They can make walking painful. What are the causes? A bunion may be caused by: Wearing narrow or pointed shoes that force your big toe to press against the other toes. Problems with how your foot is shaped. Changes in your foot caused by some diseases or conditions. A foot injury. What increases the risk? You're more likely to get a bunion if: You wear shoes that squeeze your toes. You have certain diseases, such as: Rheumatoid arthritis. Cerebral palsy. Someone in your family gets bunions too. You have flat feet or low arches. You do things that put a lot of pressure on your feet, such as ballet. What are the signs or symptoms? The main symptom is a bump on the inner side of your big toe. You may also have: Pain. Redness and swelling around your big toe. Thick or hard skin on your big toe or between your toes. Stiffness or loss of movement in your big toe. Trouble walking. How is this diagnosed? A bunion may be diagnosed based on your symptoms, medical history, and activities.  You may also have tests, such as an X-ray. This helps your health care provider see the bones in your foot and look for damage to your joint. How is this treated? Treatment can help with symptoms and can stop the bunion from getting worse. What you need to do may depend on how bad your symptoms are. You may need to: Wear shoes that have a wide toe box. Use bunion pads to cushion your toes. Tape your toes together. Place an insert called an orthotic device in your shoe. This can help take pressure off your toe joint. Take medicine to help with pain and swelling. Put ice or heat on your foot. Do stretching exercises. Have surgery. You may need this if the bunion is causing very  bad symptoms. Follow these instructions at home: Managing pain, stiffness, and swelling     Use ice or an ice pack as told. Place a towel between your skin and the ice. Leave the ice on for 20 minutes, 2-3 times a day. Use heat as told. Use the heat source that your provider recommends, such as a moist heat pack or a heating pad. Do this as often as told. Place a towel between your skin and the heat source. Leave the heat on for 20-30 minutes. If your skin turns red, take off the ice or heat right away to prevent skin damage. The risk of damage is higher if you can't feel pain, heat, or cold. General instructions Exercise as told. Support your toe joint as told with: The right footwear. Shoe padding. Taping. Wear shoes that have a wide toe box. Avoid wearing tight shoes or shoes with high heels. Take your medicines only as told. Do not smoke, vape, or use nicotine or tobacco. Keep all follow-up visits. Your provider will check if the treatments are working. Contact a health care provider if: Your symptoms get worse. Your symptoms don't get better in 2 weeks. Get help right away if: You have very bad pain and trouble walking. This information is not intended to replace advice given to you by your health care provider. Make sure you discuss any questions you have with your health  care provider. Document Revised: 08/10/2022 Document Reviewed: 08/10/2022 Elsevier Patient Education  2024 Elsevier Inc.   Fluconazole  Tablets What is this medication? FLUCONAZOLE  (floo KON na zole) prevents and treats fungal or yeast infections. It belongs to a group of medications called antifungals. It will not prevent or treat colds, the flu, or infections caused by bacteria or viruses. This medicine may be used for other purposes; ask your health care provider or pharmacist if you have questions. COMMON BRAND NAME(S): Diflucan  What should I tell my care team before I take this medication? They  need to know if you have any of these conditions: Irregular heartbeat or rhythm Kidney disease Liver disease Low levels of potassium in the blood An unusual or allergic reaction to fluconazole , other medications, foods, dyes, or preservatives Pregnant or trying to get pregnant Breastfeeding How should I use this medication? Take this medication by mouth. Take it as directed on the prescription label at the same time every day. You can take it with or without food. If it upsets your stomach, take it with food. Take all of this medication unless your care team tells you to stop it early. Keep taking it even if you think you are better. Talk to your care team about the use of this medication in children. While it may be prescribed for children as young as newborns for selected conditions, precautions do apply. Overdosage: If you think you have taken too much of this medicine contact a poison control center or emergency room at once. NOTE: This medicine is only for you. Do not share this medicine with others. What if I miss a dose? If you miss a dose, take it as soon as you can. If it is almost time for your next dose, take only that dose. Do not take double or extra doses. What may interact with this medication? Do not take this medication with any of the following: Adagrasib Flibanserin Lomitapide Lonafarnib Other medications that cause heart rhythm changes Triazolam This medication may also interact with the following: Abrocitinib Certain antibiotics, such as rifabutin or rifampin Certain antivirals for HIV or hepatitis Certain medications for blood pressure, heart disease, irregular heartbeat Certain medications for cholesterol, such as atorvastatin, lovastatin, simvastatin Certain medications for depression, such as amitriptyline or nortriptyline Certain medications for diabetes, such as glipizide or glyburide Certain medications for seizures, such as carbamazepine or  phenytoin Certain medications that treat or prevent blood clots, such as warfarin Certain opioid medications for pain, such as alfentanil, fentanyl, methadone Cyclophosphamide Cyclosporine Ibrutinib Lemborexant Midazolam NSAIDS, medications for pain and inflammation, such as ibuprofen or naproxen  Olaparib Sirolimus Steroid medications, such as prednisone Tacrolimus Theophylline Tofacitinib Tolvaptan Vinblastine Vincristine Vitamin A Voriconazole This list may not describe all possible interactions. Give your health care provider a list of all the medicines, herbs, non-prescription drugs, or dietary supplements you use. Also tell them if you smoke, drink alcohol, or use illegal drugs. Some items may interact with your medicine. What should I watch for while using this medication? Visit your care team for regular checkups. If you are taking this medication for a long time you may need blood work. Tell your care team if your symptoms do not improve. Some fungal infections need many weeks or months of treatment to cure. Alcohol can increase possible damage to your liver. Avoid alcoholic drinks. If you have a vaginal infection, do not have sex until you have finished your treatment. You can wear a sanitary napkin. Do not use tampons. Wear freshly  washed cotton, not synthetic, panties. What side effects may I notice from receiving this medication? Side effects that you should report to your care team as soon as possible: Allergic reactions--skin rash, itching, hives, swelling of the face, lips, tongue, or throat Heart rhythm changes--fast or irregular heartbeat, dizziness, feeling faint or lightheaded, chest pain, trouble breathing Liver injury--right upper belly pain, loss of appetite, nausea, light-colored stool, dark yellow or brown urine, yellowing skin or eyes, unusual weakness or fatigue Low adrenal gland function--nausea, vomiting, loss of appetite, unusual weakness or fatigue,  dizziness Rash, fever, and swollen lymph nodes Redness, blistering, peeling, or loosening of the skin, including inside the mouth Seizures Side effects that usually do not require medical attention (report to your care team if they continue or are bothersome): Change in taste Diarrhea Dizziness Headache Nausea Stomach pain This list may not describe all possible side effects. Call your doctor for medical advice about side effects. You may report side effects to FDA at 1-800-FDA-1088. Where should I keep my medication? Keep out of the reach of children. Store at room temperature below 30 degrees C (86 degrees F). Throw away any medication after the expiration date. NOTE: This sheet is a summary. It may not cover all possible information. If you have questions about this medicine, talk to your doctor, pharmacist, or health care provider.  2024 Elsevier/Gold Standard (2022-03-28 00:00:00)

## 2023-05-31 LAB — HEPATIC FUNCTION PANEL
ALT: 16 [IU]/L (ref 0–32)
AST: 12 [IU]/L (ref 0–40)
Albumin: 4.4 g/dL (ref 3.9–4.9)
Alkaline Phosphatase: 98 [IU]/L (ref 44–121)
Bilirubin Total: 0.9 mg/dL (ref 0.0–1.2)
Bilirubin, Direct: 0.27 mg/dL (ref 0.00–0.40)
Total Protein: 6.2 g/dL (ref 6.0–8.5)

## 2023-05-31 LAB — CBC WITH DIFFERENTIAL/PLATELET
Basophils Absolute: 0.1 10*3/uL (ref 0.0–0.2)
Basos: 1 %
EOS (ABSOLUTE): 0.1 10*3/uL (ref 0.0–0.4)
Eos: 1 %
Hematocrit: 38.3 % (ref 34.0–46.6)
Hemoglobin: 12.4 g/dL (ref 11.1–15.9)
Immature Grans (Abs): 0 10*3/uL (ref 0.0–0.1)
Immature Granulocytes: 0 %
Lymphocytes Absolute: 2.4 10*3/uL (ref 0.7–3.1)
Lymphs: 36 %
MCH: 29.5 pg (ref 26.6–33.0)
MCHC: 32.4 g/dL (ref 31.5–35.7)
MCV: 91 fL (ref 79–97)
Monocytes Absolute: 0.4 10*3/uL (ref 0.1–0.9)
Monocytes: 6 %
Neutrophils Absolute: 3.7 10*3/uL (ref 1.4–7.0)
Neutrophils: 56 %
Platelets: 220 10*3/uL (ref 150–450)
RBC: 4.2 x10E6/uL (ref 3.77–5.28)
RDW: 13.6 % (ref 11.7–15.4)
WBC: 6.6 10*3/uL (ref 3.4–10.8)

## 2023-06-03 ENCOUNTER — Encounter: Payer: Self-pay | Admitting: Podiatry

## 2023-06-03 ENCOUNTER — Other Ambulatory Visit: Payer: Self-pay | Admitting: Podiatry

## 2023-06-03 LAB — ECHOCARDIOGRAM COMPLETE
AR max vel: 2.32 cm2
AV Area VTI: 2.31 cm2
AV Area mean vel: 2.21 cm2
AV Mean grad: 3 mmHg
AV Peak grad: 6.5 mmHg
Ao pk vel: 1.27 m/s
Area-P 1/2: 4.29 cm2
S' Lateral: 2.98 cm

## 2023-06-03 MED ORDER — FLUCONAZOLE 150 MG PO TABS: 150.0000 mg | ORAL_TABLET | ORAL | 0 refills | Status: DC

## 2023-06-05 NOTE — Progress Notes (Signed)
 Subjective:   Patient ID: Cheryl [name], female   DOB: 68 y.o.   MRN: [id number]   HPI Chief Complaint  Patient presents with   Nail Problem    RM#14 Follow up on nail fungus completed treatment.     68 year old female presents the office with above concerns.  States that she is doing well.  She has completed the course of fluconazole  which has helped some.  She has not had any side effects.   She also has concerns about a bunion that does cause discomfort at times.  She tried shoe modifications and offloading.   No injuries.     Objective:  Physical Exam  General: AAO x3, NAD  Dermatological: The nails are all hypertrophic, dystrophic with yellow discoloration and some old debris is present.  There is clearing of the proximal nail folds.  There is no edema, erythema or signs of infection.  No open lesions.  Vascular: Dorsalis Pedis artery and Posterior Tibial artery pedal pulses are 2/4 bilateral with immedate capillary fill time.  There is no pain with calf compression, swelling, warmth, erythema.   Neruologic: Grossly intact via light touch bilateral. .   Musculoskeletal: Bunion is present.  There is no other areas of discomfort.     Assessment:   Onychomycosis;      Plan:  -Treatment options discussed including all alternatives, risks, and complications -Etiology of symptoms were discussed -Will recheck blood work including CBC, LFT and likely extend fluconazole  for an additional 3 months.  Debrided the nails any complications or bleeding.  Recommend routine debridement. -In regards to the bunion we discussed with conservative as well as surgical options.  She is not continue conservative management for now.  Continue shoe modifications, offloading padding.  Consider steroid injection if needed.  She can use topical medications to help with discomfort as needed as well.  Return in about 3 months (around 08/29/2023) for nail fungus, bunion.  Charity Conch DPM

## 2023-08-06 ENCOUNTER — Other Ambulatory Visit: Payer: Self-pay | Admitting: Family Medicine

## 2023-08-06 DIAGNOSIS — Z1231 Encounter for screening mammogram for malignant neoplasm of breast: Secondary | ICD-10-CM

## 2023-08-29 ENCOUNTER — Ambulatory Visit (INDEPENDENT_AMBULATORY_CARE_PROVIDER_SITE_OTHER): Admitting: Podiatry

## 2023-08-29 ENCOUNTER — Encounter: Payer: Self-pay | Admitting: Podiatry

## 2023-08-29 DIAGNOSIS — M2010 Hallux valgus (acquired), unspecified foot: Secondary | ICD-10-CM

## 2023-08-29 DIAGNOSIS — B351 Tinea unguium: Secondary | ICD-10-CM

## 2023-08-29 MED ORDER — FLUCONAZOLE 150 MG PO TABS: 150.0000 mg | ORAL_TABLET | ORAL | 0 refills | Status: DC

## 2023-08-30 ENCOUNTER — Ambulatory Visit
Admission: RE | Admit: 2023-08-30 | Discharge: 2023-08-30 | Disposition: A | Discharge status: Home / Self Care | Source: Ambulatory Visit | Attending: Family Medicine | Admitting: Family Medicine

## 2023-08-30 DIAGNOSIS — Z1231 Encounter for screening mammogram for malignant neoplasm of breast: Secondary | ICD-10-CM

## 2023-09-01 NOTE — Progress Notes (Signed)
 Subjective:   Patient ID: Cheryl [name], female   DOB: 68 y.o.   MRN: [id number]   HPI Chief Complaint  Patient presents with   Nail Problem    Rm11 Patient here for nail fungus and bunion right foot/pt reports no pain with bunion and some improvement with nails      68 year old female presents the office with above concerns.  States that she is doing well.  She feels that the fluconazole  has been helping.  No side effects that she reports.  The bunion is not causing any discomfort at this time.     Objective:  Physical Exam  General: AAO x3, NAD  Dermatological: The nails are all hypertrophic, dystrophic with yellow discoloration and some old debris is present.  There is clearing of the proximal nail folds, and continues to improve.  There is no edema, erythema or signs of infection.  No open lesions.  Vascular: Dorsalis Pedis artery and Posterior Tibial artery pedal pulses are 2/4 bilateral with immedate capillary fill time.  There is no pain with calf compression, swelling, warmth, erythema.   Neruologic: Grossly intact via light touch bilateral. .   Musculoskeletal: Bunion is present, no pain noted.  There is no other areas of discomfort.     Assessment:   Onychomycosis     Plan:  -Treatment options discussed including all alternatives, risks, and complications -Etiology of symptoms were discussed - Serpe debrided the nails x 10 without any complications or bleeding.   - Recently had blood work done.  Refilled fluconazole .  Continue monitoring side effects.  Return in about 3 months (around 11/29/2023) for nai fungus.  Cheryl [name] DPM

## 2023-11-22 NOTE — Progress Notes (Unsigned)
 CARDIOLOGY CONSULT NOTE       Patient ID: Cheryl [name] MRN: [id number] DOB/AGE: [birth year] 68 y.o.   Referring Physician: Josette Aho PA Primary Physician: Lazoff, Shawn P, DO Primary Cardiologist: New Reason for Consultation: Dyspnea, HLD    HPI:  68 y.o. referred by Aho PA for dyspnea and HLD with HTN.  She has graves dx on synthroid replacement She has a history of pericarditis in 2012, which required a 4-day hospital stay. She experiences shortness of breath during her commute to work and feels more fatigued than usual after her 8 to 10-hour workday.  Lab work reviewed 11/08/23 showed LDL 87 TSH 1.6, A1c 5.2 andnormal LFTls  Currently not on statin  Echo done 05/28/23 reviewed: EF 55-60% normal RV no significant valve dx or pericardial effusion. Carotids 05/29/23 also normal with antegrade vertebral flow  ***  ROS All other systems reviewed and negative except as noted above  Past Medical History:  Diagnosis Date  . Anemia   . Chest pain   . Chronic dermatitis   . Edema 06/15/2010  . Edema of both legs   . Grave's disease   . HLD (hyperlipidemia)   . HTN (hypertension)   . Leg edema 09/02/2012  . Obesity   . Pericarditis 10/05/2010  . Thyroid  disease 06/15/2010  . Varicose veins   . 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 Varicose veins of lower extremities with other complications 09/02/2012    Family History  Problem Relation Age of Onset  . Cancer Mother        lung  . Other Mother        varicose veins  . Varicose Veins Mother   . Heart disease Father   . Hyperlipidemia Father   . Hypertension Father   . Other Father        pvd  . Heart attack Father   . Peripheral vascular disease Father        amputation  . Breast cancer Sister   . Cancer Sister   . Cancer Sister   . Coronary artery disease Other     Social History   Socioeconomic History  . Marital status: Divorced    Spouse name: Not on file  . Number of children: Not on file   . Years of education: Not on file  . Highest education level: Not on file  Occupational History  . Occupation: automobile purifiers  Tobacco Use  . Smoking status: Never  . Smokeless tobacco: Never  Vaping Use  . Vaping status: Never Used  Substance and Sexual Activity  . Alcohol use: No    Alcohol/week: 0.0 standard drinks of alcohol  . Drug use: No  . Sexual activity: Not on file  Other Topics Concern  . Not on file  Social History Narrative  . Not on file   Social Drivers of Health   Financial Resource Strain: Not on file  Food Insecurity: Low Risk  (04/17/2023)   Received from [facility]   Hunger Vital Sign   . Within the past 12 months, you worried that your food would run out before you got money to buy more: Never true   . Within the past 12 months, the food you bought just didn't last and you didn't have money to get more. : Never true  Transportation Needs: No Transportation Needs (04/17/2023)   Received from [facility]   . In the past 12 months, has lack of reliable transportation  kept you from medical appointments, meetings, work or from getting things needed for daily living? : No  Physical Activity: Not on file  Stress: Not on file  Social Connections: Not on file  Intimate Partner Violence: Not on file    Past Surgical History:  Procedure Laterality Date  . BREAST BIOPSY Left   . CHOLECYSTECTOMY    . ENDOVENOUS ABLATION SAPHENOUS VEIN W/ LASER    . ENDOVENOUS ABLATION SAPHENOUS VEIN W/ LASER Left 04/21/2020   endovenous laser ablation left greater saphenous vein/anterior accessory branch by Medford Blade MD   . SKIN GRAFT        Current Outpatient Medications:  .  acetaminophen  (TYLENOL ) 500 MG tablet, Take 2 tablets (1,000 mg total) by mouth every 6 (six) hours as needed., Disp: 30 tablet, Rfl: 0 .  fluconazole  (DIFLUCAN ) 150 MG tablet, Take 1 tablet (150 mg total) by mouth once a week., Disp: 12 tablet, Rfl: 0 .  ibuprofen  (ADVIL,MOTRIN) 200 MG tablet, Take 200 mg by mouth every 6 (six) hours as needed. As needed, when not taking Meloxicam , Disp: , Rfl:  .  levothyroxine (SYNTHROID, LEVOTHROID) 175 MCG tablet, Take 175 mcg by mouth daily. , Disp: , Rfl: 5    Physical Exam: There were no vitals taken for this visit.    Affect appropriate Healthy:  appears stated age HEENT: normal Neck supple with no adenopathy JVP normal no bruits no thyromegaly Lungs clear with no wheezing and good diaphragmatic motion Heart:  S1/S2 no murmur, no rub, gallop or click PMI normal Abdomen: benighn, BS positve, no tenderness, no AAA no bruit.  No HSM or HJR Distal pulses intact with no bruits No edema Neuro non-focal Skin warm and dry No muscular weakness   Labs:   Lab Results  Component Value Date   WBC 6.6 05/30/2023   HGB 12.4 05/30/2023   HCT 38.3 05/30/2023   MCV 91 05/30/2023   PLT 220 05/30/2023   No results for input(s): NA, K, CL, CO2, BUN, CREATININE, CALCIUM, PROT, BILITOT, ALKPHOS, ALT, AST, GLUCOSE in the last 168 hours.  Invalid input(s): LABALBU Lab Results  Component Value Date   CKTOTAL 54 06/28/2010   CKMB 1.6 06/28/2010   TROPONINI  06/28/2010    <0.30        Due to the release kinetics of cTnI, a negative result within the first hours of the onset of symptoms does not rule out myocardial infarction with certainty. If myocardial infarction is still suspected, repeat the test at appropriate intervals. **Please note change in reference range.**    Lab Results  Component Value Date   CHOL  05/31/2010    189        ATP III CLASSIFICATION:  <200     mg/dL   Desirable  799-760  mg/dL   Borderline High  >=759    mg/dL   High          Lab Results  Component Value Date   HDL 36 (L) 05/31/2010   Lab Results  Component Value Date   LDLCALC (H) 05/31/2010    115        Total Cholesterol/HDL:CHD Risk Coronary Heart Disease Risk Table                      Men   Women  1/2 Average Risk   3.4   3.3  Average Risk       5.0   4.4  2 X Average  Risk   9.6   7.1  3 X Average Risk  23.4   11.0        Use the calculated Patient Ratio above and the CHD Risk Table to determine the patient's CHD Risk.        ATP III CLASSIFICATION (LDL):  <100     mg/dL   Optimal  899-870  mg/dL   Near or Above                    Optimal  130-159  mg/dL   Borderline  839-810  mg/dL   High  >809     mg/dL   Very High   Lab Results  Component Value Date   TRIG 102 04/26/2019   TRIG 190 (H) 05/31/2010   Lab Results  Component Value Date   CHOLHDL 5.3 05/31/2010   No results found for: LDLDIRECT    Radiology: No results found.  EKG: SR rate 68 low voltage 03/18/23    ASSESSMENT AND PLAN:   HTN:  *** Dyspnea:  normal EF by echo Normal exam CXR 03/16/23 NAD. *** HLD:  calcium score to further risk stratify If high for age consider starting low dose statin LDL 87 Graves Dx:  on synthroid replacement TSH normal  Calcium Score ***  F/U PRN pending tests   Signed: Maude Emmer 11/22/2023, 12:47 PM

## 2023-11-28 ENCOUNTER — Ambulatory Visit: Attending: Internal Medicine | Admitting: Cardiovascular Disease

## 2023-11-28 ENCOUNTER — Encounter: Payer: Self-pay | Admitting: Cardiovascular Disease

## 2023-11-28 ENCOUNTER — Ambulatory Visit (HOSPITAL_COMMUNITY)
Admission: RE | Admit: 2023-11-28 | Discharge: 2023-11-28 | Disposition: A | Discharge status: Home / Self Care | Payer: Self-pay | Source: Ambulatory Visit | Attending: Cardiovascular Disease | Admitting: Cardiovascular Disease

## 2023-11-28 VITALS — BP 140/90 | HR 68 | Resp 16 | Ht 65.0 in | Wt 274.2 lb

## 2023-11-28 DIAGNOSIS — R0609 Other forms of dyspnea: Secondary | ICD-10-CM | POA: Diagnosis not present

## 2023-11-28 DIAGNOSIS — I10 Essential (primary) hypertension: Secondary | ICD-10-CM

## 2023-11-28 DIAGNOSIS — E782 Mixed hyperlipidemia: Secondary | ICD-10-CM

## 2023-11-28 MED ORDER — AMLODIPINE BESYLATE 5 MG PO TABS: 5.0000 mg | ORAL_TABLET | Freq: Every day | ORAL | 3 refills | Status: DC

## 2023-11-28 NOTE — Patient Instructions (Signed)
 Medication Instructions:  START amlodipine  5mg  once daily for BP  *If you need a refill on your cardiac medications before your next appointment, please call your pharmacy*  Testing: Dr. Delford has ordered a CT coronary calcium score.   Test locations:  [facility] at [facility]  [facility] at [facility]  This is $99 out of pocket.   Coronary CalciumScan A coronary calcium scan is an imaging test used to look for deposits of calcium and other fatty materials (plaques) in the inner lining of the blood vessels of the heart (coronary arteries). These deposits of calcium and plaques can partly clog and narrow the coronary arteries without producing any symptoms or warning signs. This puts a person at risk for a heart attack. This test can detect these deposits before symptoms develop. Tell a health care provider about: Any allergies you have. All medicines you are taking, including vitamins, herbs, eye drops, creams, and over-the-counter medicines. Any problems you or family members have had with anesthetic medicines. Any blood disorders you have. Any surgeries you have had. Any medical conditions you have. Whether you are pregnant or may be pregnant. What are the risks? Generally, this is a safe procedure. However, problems may occur, including: Harm to a pregnant woman and her unborn baby. This test involves the use of radiation. Radiation exposure can be dangerous to a pregnant woman and her unborn baby. If you are pregnant, you generally should not have this procedure done. Slight increase in the risk of cancer. This is because of the radiation involved in the test. What happens before the procedure? No preparation is needed for this procedure. What happens during the procedure? You will undress and remove any jewelry around your neck or chest. You will put on a hospital  gown. Sticky electrodes will be placed on your chest. The electrodes will be connected to an electrocardiogram (ECG) machine to record a tracing of the electrical activity of your heart. A CT scanner will take pictures of your heart. During this time, you will be asked to lie still and hold your breath for 2-3 seconds while a picture of your heart is being taken. The procedure may vary among health care providers and hospitals. What happens after the procedure? You can get dressed. You can return to your normal activities. It is up to you to get the results of your test. Ask your health care provider, or the department that is doing the test, when your results will be ready. Summary A coronary calcium scan is an imaging test used to look for deposits of calcium and other fatty materials (plaques) in the inner lining of the blood vessels of the heart (coronary arteries). Generally, this is a safe procedure. Tell your health care provider if you are pregnant or may be pregnant. No preparation is needed for this procedure. A CT scanner will take pictures of your heart. You can return to your normal activities after the scan is done. This information is not intended to replace advice given to you by your health care provider. Make sure you discuss any questions you have with your health care provider. Document Released: 07/21/2007 Document Revised: 12/12/2015 Document Reviewed: 12/12/2015 Elsevier Interactive Patient Education  2017 [employer].   Follow-Up: At [facility], you and your health needs are our priority.  As part of our continuing mission to provide you with exceptional heart care, our providers are all  part of one team.  This team includes your primary Cardiologist (physician) and Advanced Practice Providers or APPs (Physician Assistants and Nurse Practitioners) who all work together to provide you with the care you need, when you need it.  Your next appointment:    3-4  weeks with clinical pharmacy for BP check appointment   12 months with Dr. Delford  We recommend signing up for the patient portal called MyChart.  Sign up information is provided on this After Visit Summary.  MyChart is used to connect with patients for Virtual Visits (Telemedicine).  Patients are able to view lab/test results, encounter notes, upcoming appointments, etc.  Non-urgent messages can be sent to your provider as well.   To learn more about what you can do with MyChart, go to [email or web address].   Other Instructions

## 2023-11-29 ENCOUNTER — Ambulatory Visit: Payer: Self-pay | Admitting: Cardiovascular Disease

## 2023-11-29 DIAGNOSIS — R0609 Other forms of dyspnea: Secondary | ICD-10-CM

## 2023-11-29 DIAGNOSIS — E782 Mixed hyperlipidemia: Secondary | ICD-10-CM

## 2023-12-02 ENCOUNTER — Ambulatory Visit (INDEPENDENT_AMBULATORY_CARE_PROVIDER_SITE_OTHER): Admitting: Podiatry

## 2023-12-02 ENCOUNTER — Encounter: Payer: Self-pay | Admitting: Podiatry

## 2023-12-02 DIAGNOSIS — M778 Other enthesopathies, not elsewhere classified: Secondary | ICD-10-CM

## 2023-12-02 DIAGNOSIS — B351 Tinea unguium: Secondary | ICD-10-CM | POA: Diagnosis not present

## 2023-12-02 NOTE — Progress Notes (Unsigned)
 Subjective:   Patient ID: Cheryl [name], female   DOB: 68 y.o.   MRN: [id number]   HPI Chief Complaint  Patient presents with   Nail Problem    Patient is here for nail fungus F/U    67 year old female presents the office with above concerns.  States that she is doing well.  She feels that the fluconazole  has been helping and she just recently finished it.  For any side effects of the medication.  She does not report any other concerns today.   Objective:  Physical Exam  General: AAO x3, NAD  Dermatological: The nails are all hypertrophic, dystrophic with yellow discoloration and some old debris is present.  There is clearing of the proximal nail folds, and continues to improve the nails are still hypertrophic with distal yellow discoloration.    Vascular: Dorsalis Pedis artery and Posterior Tibial artery pedal pulses are 2/4 bilateral with immedate capillary fill time.  There is no pain with calf compression, swelling, warmth, erythema.   Neruologic: Grossly intact via light touch bilateral. .   Musculoskeletal: Bunion is present, no pain noted.  There is no other areas of discomfort.     Assessment:   Symptomatic onychomycosis     Plan:  -Treatment options discussed including all alternatives, risks, and complications should not -Etiology of symptoms were discussed -As a courtesy I debrided the nails without any complications or bleeding.  She just recently finished a course of fluconazole  and she tolerated well.  The nail seem to be growing out.  Will allow the nails to continue to grow out but if needed we can always do another round of fluconazole .  Discussed changing shoes and socks regularly to avoid reoccurrence.  Return in about 3 months (around 03/03/2024) for nail trim . (If needed)  Donnice JONELLE Fees DPM

## 2023-12-06 MED ORDER — ROSUVASTATIN CALCIUM 10 MG PO TABS: 10.0000 mg | ORAL_TABLET | Freq: Every day | ORAL | 3 refills | Status: AC

## 2023-12-06 NOTE — Telephone Encounter (Signed)
 Patient is returning call.

## 2023-12-11 ENCOUNTER — Telehealth: Payer: Self-pay | Admitting: Cardiovascular Disease

## 2023-12-11 NOTE — Telephone Encounter (Signed)
 Pt states she has been receiving calls but no voicemail from this office. I see no notes left. Pt believes it may be in regards to scheduling for a stress test. Please advise.

## 2023-12-12 ENCOUNTER — Encounter (HOSPITAL_COMMUNITY): Payer: Self-pay

## 2023-12-12 NOTE — Telephone Encounter (Signed)
 Call to patient to schedule stress test. No answer, VM box full. [facility] sent.

## 2023-12-17 ENCOUNTER — Other Ambulatory Visit: Payer: Self-pay | Admitting: Cardiovascular Disease

## 2023-12-17 DIAGNOSIS — E782 Mixed hyperlipidemia: Secondary | ICD-10-CM

## 2023-12-17 DIAGNOSIS — R0609 Other forms of dyspnea: Secondary | ICD-10-CM

## 2023-12-18 ENCOUNTER — Telehealth (HOSPITAL_COMMUNITY): Payer: Self-pay | Admitting: *Deleted

## 2023-12-18 NOTE — Telephone Encounter (Signed)
 Left message on voicemail per DPR in reference to upcoming appointment scheduled on 12/23/2023 at 7:45 with detailed instructions given per Myocardial Perfusion Study Information Sheet for the test. LM to arrive 15 minutes early, and that it is imperative to arrive on time for appointment to keep from having the test rescheduled. If you need to cancel or reschedule your appointment, please call the office within 24 hours of your appointment. Failure to do so may result in a cancellation of your appointment, and a $50 no show fee. Phone number given for call back for any questions.

## 2023-12-23 ENCOUNTER — Encounter (HOSPITAL_COMMUNITY)

## 2023-12-23 ENCOUNTER — Ambulatory Visit (HOSPITAL_COMMUNITY)
Admission: RE | Admit: 2023-12-23 | Discharge: 2023-12-23 | Disposition: A | Discharge status: Home / Self Care | Source: Ambulatory Visit | Attending: Cardiology | Admitting: Cardiology

## 2023-12-23 DIAGNOSIS — E782 Mixed hyperlipidemia: Secondary | ICD-10-CM | POA: Insufficient documentation

## 2023-12-23 DIAGNOSIS — R0609 Other forms of dyspnea: Secondary | ICD-10-CM | POA: Diagnosis present

## 2023-12-23 LAB — MYOCARDIAL PERFUSION IMAGING
LV dias vol: 131 mL (ref 46–106)
LV sys vol: 43 mL (ref 3.8–5.2)
Nuc Stress EF: 67 %
Peak HR: 82 {beats}/min
Rest HR: 80 {beats}/min
Rest Nuclear Isotope Dose: 12.2 mCi
SDS: 0
SRS: 5
SSS: 1
ST Depression (mm): 0 mm
Stress Nuclear Isotope Dose: 37.4 mCi
TID: 1.13

## 2023-12-23 MED ORDER — REGADENOSON 0.4 MG/5ML IV SOLN
INTRAVENOUS | Status: AC
Start: 2023-12-23 — End: 2023-12-23
  Filled 2023-12-23: qty 5

## 2023-12-23 MED ORDER — REGADENOSON 0.4 MG/5ML IV SOLN
0.4000 mg | Freq: Once | INTRAVENOUS | Status: AC
  Administered 2023-12-23: 0.4 mg via INTRAVENOUS

## 2023-12-23 MED ORDER — TECHNETIUM TC 99M TETROFOSMIN IV KIT
12.2000 | PACK | Freq: Once | INTRAVENOUS | Status: AC | PRN
  Administered 2023-12-23: 12.2 via INTRAVENOUS

## 2023-12-23 MED ORDER — TECHNETIUM TC 99M TETROFOSMIN IV KIT
37.4000 | PACK | Freq: Once | INTRAVENOUS | Status: AC | PRN
Start: 2023-12-23 — End: 2023-12-23
  Administered 2023-12-23: 37.4 via INTRAVENOUS

## 2023-12-24 ENCOUNTER — Ambulatory Visit: Payer: Self-pay | Admitting: Cardiovascular Disease

## 2023-12-25 ENCOUNTER — Ambulatory Visit: Payer: Self-pay | Admitting: Cardiovascular Disease

## 2023-12-31 ENCOUNTER — Ambulatory Visit

## 2023-12-31 VITALS — BP 117/75 | HR 99

## 2023-12-31 DIAGNOSIS — I10 Essential (primary) hypertension: Secondary | ICD-10-CM | POA: Diagnosis not present

## 2023-12-31 NOTE — Progress Notes (Signed)
 Patient ID: Cheryl [name]                 DOB: 17-Aug-1955                      MRN: [id number]      HPI: Cheryl [name] is a 68 y.o. female referred by Dr. Delford to HTN clinic. PMH is significant for HTN, HLD, Graves disease, and dyspnea.  Patient presents today for PharmD HTN clinic. Patient was last seen by cardiology provider in October 2025, at which time her blood pressure was 140/90 mmHg and amlodipine  5 mg was initiated. She reports her blood pressure has generally been normal and she hasn't required BP medications, except for an ED visit in February 2025. She recalls reporting to the ED after experiencing dizziness, nausea and vomiting and her BP was in the 200s/80s. Her exam and labs were normal. She was given amlodipine  and advised to follow up with her primary care provider. At the subsequent PCP visit, her blood pressure was 130/84 mmHg. She was instructed to monitor her blood pressure at home, and if readings remain mostly within range, no changes will be made until the next visit.   Today, she denies any SOB,  headache or chest pain. She is tolerating amlodipine  well with no side effects. She admits that lately she has been feeling better than before. She does not get tired when she shops anymore and doesn't have to pause to catch her breath.  She does not check BP at home but has a cuff, she reports not having the chance to get around to it but willing to start monitoring before going to work.  Current HTN meds: amlodipine  5 mg daily  Previously tried: none BP goal: <130/80 Labs: (11/2023): K 3.9, Scr 0.66; Crcl 110 ml/min  Family History:  Relation Problem Comments  Mother (Deceased) Cancer lung  Other varicose veins  Varicose Veins     Father (Deceased) Heart attack   Heart disease   Hyperlipidemia   Hypertension   Other pvd  Peripheral vascular disease amputation    Sister Metallurgist) Breast cancer   Cancer     Sister (Deceased) Cancer     Other Coronary artery  disease     Son Metallurgist)    Social History:  Alcohol: none Smoking: none   Diet: Patient reports low salt diet and denies adding salt to her foods  Exercise: No structured exercise. She is very active at work, frequently walking throughout the day. She mentioned being instructed to stand for periods ranging from 10 to 30 minutes at a time.  Home BP readings: No log provided   Wt Readings from Last 3 Encounters:  11/28/23 274 lb 3.2 oz (124.4 kg)  03/16/23 281 lb 15.5 oz (127.9 kg)  04/27/20 282 lb (127.9 kg)   BP Readings from Last 3 Encounters:  12/31/23 117/75  11/28/23 (!) 140/90  03/17/23 (!) 188/91   Pulse Readings from Last 3 Encounters:  12/31/23 99  11/28/23 68  03/17/23 67    Renal function: CrCl cannot be calculated (Patient's most recent lab result is older than the maximum 21 days allowed.).  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 30 tablet 0   amLODipine  (NORVASC ) 5 MG tablet Take 1 tablet (5 mg total) by mouth daily. 90 tablet 3   ibuprofen (ADVIL,MOTRIN) 200 MG tablet Take 200 mg by mouth every 6 (six) hours as needed. As needed, when not taking Meloxicam      levothyroxine (SYNTHROID) 137 MCG tablet Take 137 mcg by mouth daily before breakfast.     rosuvastatin  (CRESTOR ) 10 MG tablet Take 1 tablet (10 mg total) by mouth daily. 90 tablet 3   No current facility-administered  medications on file prior to visit.    Allergies  Allergen Reactions   Antihistamines, Chlorpheniramine-Type    Simvastatin Other (See Comments)    Made her feel bad   Terbinafine Other (See Comments)    Made her skin gel-up    Blood pressure 117/75, pulse 99, SpO2 100%.   Assessment/Plan:  1. Hypertension -  Hypertension Assessment: BP is controlled in office BP 117/ 75 mmHg below the goal (<130/80). Does not check BP at home; Discussed proper technique Tolerates amlodipine  well without any side effects Denies SOB, palpitation, chest pain, headaches,or swelling Reiterated the importance of regular exercise and low salt diet   Plan:  Continue taking amlodipine  5 mg daily Patient to keep record of BP readings with heart rate and report to us  at the next visit Patient to bring BP monitor to next visit to confirm accuracy Patient to see PharmD in 5-6 weeks for follow up     Thank you  Makayela Secrest E. Beren Yniguez, Pharm.D [place] [facility]. [facility] [address], [place], [place] [address] Phone: [phone]; Fax: [phone]

## 2023-12-31 NOTE — Telephone Encounter (Signed)
 Call to patient to see if she wanted to reschedule stress test, patient advises she completed stress test on 12/23/23.

## 2023-12-31 NOTE — Telephone Encounter (Signed)
 Call to patient, reviewed that stress test results were normal. Patient verbalizes understanding.

## 2023-12-31 NOTE — Patient Instructions (Addendum)
 Changes made by your pharmacist Halah Whiteside E. Keslie Gritz, PharmD at today's visit:    Instructions/Changes  (what do you need to do) Your Notes  (what you did and when you did it)  Continue amlodipine  5 mg daily   2.  Check your blood pressure daily and       record in the blood pressure log    3. PharmD visit in 6 weeks     Bring all of your meds, your BP cuff and your record of home blood pressures to your next appointment.   Halcyon Heck E. Sam Wunschel, Pharm.D [place] [facility]. [facility] [address], [place], [place] [address] Phone: [phone]; Fax: [phone]     HOW TO TAKE YOUR BLOOD PRESSURE AT HOME  Rest 5 minutes before taking your blood pressure.  Don't smoke or drink caffeinated beverages for at least 30 minutes before. Take your blood pressure before (not after) you eat. Sit comfortably with your back supported and both feet on the floor (don't cross your legs). Elevate your arm to heart level on a table or a desk. Use the proper sized cuff. It should fit smoothly and snugly around your bare upper arm. There should be enough room to slip a fingertip under the cuff. The bottom edge of the cuff should be 1 inch above the crease of the elbow. Ideally, take 3 measurements at one sitting and record the average.  Important lifestyle changes to control high blood pressure  Intervention  Effect on the BP  Lose extra pounds and watch your waistline Weight loss is one of the most effective lifestyle changes for controlling blood pressure. If you're overweight or obese, losing even a small amount of weight can help reduce blood pressure. Blood pressure might go down by about 1 millimeter of mercury (mm Hg) with each kilogram (about 2.2 pounds) of weight lost.  Exercise regularly As a general goal, aim for at least 30 minutes of moderate physical activity every day. Regular physical activity can lower high blood pressure by about 5 to 8 mm Hg.  Eat a  healthy diet Eating a diet rich in whole grains, fruits, vegetables, and low-fat dairy products and low in saturated fat and cholesterol. A healthy diet can lower high blood pressure by up to 11 mm Hg.  Reduce salt (sodium) in your diet Even a small reduction of sodium in the diet can improve heart health and reduce high blood pressure by about 5 to 6 mm Hg.  Limit alcohol One drink equals 12 ounces of beer, 5 ounces of wine, or 1.5 ounces of 80-proof liquor.  Limiting alcohol to less than one drink a day for women or two drinks a day for men can help lower blood pressure by about 4 mm Hg.   If you have any questions or concerns please use My Chart to send questions or call the office at [phone]

## 2023-12-31 NOTE — Assessment & Plan Note (Addendum)
 Assessment: BP is controlled in office BP 117/ 75 mmHg below the goal (<130/80). Does not check BP at home; Discussed proper technique Tolerates amlodipine  well without any side effects Denies SOB, palpitation, chest pain, headaches,or swelling Reiterated the importance of regular exercise and low salt diet   Plan:  Continue taking amlodipine  5 mg daily Patient to keep record of BP readings with heart rate and report to us  at the next visit Patient to bring BP monitor to next visit to confirm accuracy Patient to see PharmD in 5-6 weeks for follow up

## 2024-02-18 ENCOUNTER — Ambulatory Visit: Attending: Cardiology

## 2024-02-18 ENCOUNTER — Other Ambulatory Visit (HOSPITAL_COMMUNITY): Payer: Self-pay

## 2024-02-18 VITALS — BP 130/82 | HR 79

## 2024-02-18 DIAGNOSIS — I10 Essential (primary) hypertension: Secondary | ICD-10-CM

## 2024-02-18 MED ORDER — OMRON 3 SERIES BP MONITOR DEVI
1.0000 | Freq: Every day | 0 refills | Status: AC
  Filled 2024-02-18: qty 1, 30d supply, fill #0

## 2024-02-18 NOTE — Assessment & Plan Note (Addendum)
 Assessment: BP is 130/82 HR 79 in office BP slightly above the goal (<130/80) Home BP averages 152/70 above goal (<130/80); Question reliability of blood pressure cuff considering she placing at forearm because too snug on arm, will send rx for cuff downstairs Tolerates amlodipine  well without any side effects Denies palpitation, chest pain, headaches,or swelling; Reports some SOB upon exertion Reiterated the importance of regular exercise and low salt diet   Plan:  Continue taking amlodipine  5 mg daily Obtain new BP monitor and log BP for 2 weeks and call PharmD; monitor BP regularly and around same time each day Will consider increasing amlodipine  to 10 mg daily if additional BP control needed.

## 2024-02-18 NOTE — Patient Instructions (Signed)
 Changes made by your pharmacist Ayanna Gheen E. Azjah Pardo, PharmD, CPP at today's visit:    Instructions/Changes  (what do you need to do) Your Notes  (what you did and when you did it)  Continue amlodipine  5 mg daily   2. Call PharmD in 2 weeks with blood      pressure log and we will make      adjustments if needed   3. Will schedule next PharmD appointment     during telephone visit in 2 weeks     Bring all of your meds, your BP cuff and your record of home blood pressures to your next appointment.   Ruthene Methvin E. Krystn Dermody, Pharm.D, CPP [place] [facility]. [facility] [address], [place], [place] [address] Phone: [phone]; Fax: [phone]     HOW TO TAKE YOUR BLOOD PRESSURE AT HOME  Rest 5 minutes before taking your blood pressure.  Dont smoke or drink caffeinated beverages for at least 30 minutes before. Take your blood pressure before (not after) you eat. Sit comfortably with your back supported and both feet on the floor (dont cross your legs). Elevate your arm to heart level on a table or a desk. Use the proper sized cuff. It should fit smoothly and snugly around your bare upper arm. There should be enough room to slip a fingertip under the cuff. The bottom edge of the cuff should be 1 inch above the crease of the elbow. Ideally, take 3 measurements at one sitting and record the average.  Important lifestyle changes to control high blood pressure  Intervention  Effect on the BP  Lose extra pounds and watch your waistline Weight loss is one of the most effective lifestyle changes for controlling blood pressure. If you're overweight or obese, losing even a small amount of weight can help reduce blood pressure. Blood pressure might go down by about 1 millimeter of mercury (mm Hg) with each kilogram (about 2.2 pounds) of weight lost.  Exercise regularly As a general goal, aim for at least 30 minutes of moderate physical activity every day.  Regular physical activity can lower high blood pressure by about 5 to 8 mm Hg.  Eat a healthy diet Eating a diet rich in whole grains, fruits, vegetables, and low-fat dairy products and low in saturated fat and cholesterol. A healthy diet can lower high blood pressure by up to 11 mm Hg.  Reduce salt (sodium) in your diet Even a small reduction of sodium in the diet can improve heart health and reduce high blood pressure by about 5 to 6 mm Hg.  Limit alcohol One drink equals 12 ounces of beer, 5 ounces of wine, or 1.5 ounces of 80-proof liquor.  Limiting alcohol to less than one drink a day for women or two drinks a day for men can help lower blood pressure by about 4 mm Hg.   If you have any questions or concerns please use My Chart to send questions or call the office at [phone]

## 2024-02-18 NOTE — Progress Notes (Signed)
 Patient ID: Cheryl [name]                 DOB: 28-Mar-1955                      MRN: [id number]      HPI: Cheryl [name] is a 69 y.o. female referred by Dr. Delford to HTN clinic. PMH is significant for HTN, HLD, Graves disease, and dyspnea.  02/18/2024 (Today):  Patient presents today for PharmD hypertension follow-up. Last seen by PharmD in November 2025, with no medication changes at that time. Blood pressure was at goal during that visit, and the patient had not been monitoring at home; she was instructed to begin doing so. Since then, she has been checking her blood pressure, which has been variable, as readings were taken at different times of day and appear higher than expected. Her current cuff is too small, so she has been measuring on her forearm, which may result in less accurate readings. Denies palpitation, chest pain, headaches,or swelling; reports some SOB upon exertion.   Home BP readings: (sporadically 12/2023 - 02/2024) 137/67 70 165/81 74 121/63 68 125/59 68 172/80 75 166/78 58 169/68 65 164/64 70 153/71 68 Average: 152/70 HR 68  Current HTN meds: amlodipine  5 mg daily  Previously tried: none BP goal: <130/80  Labs: (02/2024): K 3.9, Scr 0.64; Crcl 113 ml/min  12/31/2023 Patient presents today for PharmD HTN clinic. Patient was last seen by cardiology provider in October 2025, at which time her blood pressure was 140/90 mmHg and amlodipine  5 mg was initiated. She reports her blood pressure has generally been normal and she hasn't required BP medications, except for an ED visit in February 2025. She recalls reporting to the ED after experiencing dizziness, nausea and vomiting and her BP was in the 200s/80s. Her exam and labs were normal. She was given amlodipine  and advised to follow up with her primary care provider. At the subsequent PCP visit, her blood pressure was 130/84 mmHg. She was instructed to monitor her blood pressure at home, and if readings remain mostly  within range, no changes will be made until the next visit.   Today, she denies any SOB,  headache or chest pain. She is tolerating amlodipine  well with no side effects. She admits that lately she has been feeling better than before. She does not get tired when she shops anymore and doesn't have to pause to catch her breath.  She does not check BP at home but has a cuff, she reports not having the chance to get around to it but willing to start monitoring before going to work.  Family History:  Relation Problem Comments  Mother (Deceased) Cancer lung  Other varicose veins  Varicose Veins     Father (Deceased) Heart attack   Heart disease   Hyperlipidemia   Hypertension   Other pvd  Peripheral vascular disease amputation    Sister Metallurgist) Breast cancer   Cancer     Sister (Deceased) Cancer     Other Coronary artery disease     Son Metallurgist)    Social History:  Alcohol: none Smoking: none  Diet: Patient reports low salt diet and denies adding salt to her foods  Exercise: No structured exercise. She is very active at work, frequently walking throughout the day. She mentioned being instructed to stand for periods ranging from 10 to 30 minutes at a time.  Wt Readings from Last 3 Encounters:  11/28/23  274 lb 3.2 oz (124.4 kg)  03/16/23 281 lb 15.5 oz (127.9 kg)  04/27/20 282 lb (127.9 kg)   BP Readings from Last 3 Encounters:  02/18/24 130/82  12/31/23 117/75  11/28/23 (!) 140/90   Pulse Readings from Last 3 Encounters:  02/18/24 79  12/31/23 99  11/28/23 68    Renal function: CrCl cannot be calculated (Patient's most recent lab result is older than the maximum 21 days allowed.).  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 30 tablet 0   amLODipine  (NORVASC ) 5 MG tablet Take 1 tablet (5 mg total) by mouth daily. 90 tablet 3   ibuprofen (ADVIL,MOTRIN) 200 MG tablet Take 200 mg by mouth every 6 (six) hours as needed. As needed, when not taking Meloxicam      levothyroxine (SYNTHROID) 137 MCG tablet Take 137 mcg by mouth daily before breakfast.     rosuvastatin  (CRESTOR ) 10 MG tablet Take 1 tablet (10 mg total) by mouth daily. 90 tablet 3   No current facility-administered medications on file prior to visit.    Allergies  Allergen Reactions   Antihistamines, Chlorpheniramine-Type    Simvastatin Other (See Comments)    Made her feel bad   Terbinafine Other (See Comments)    Made her skin gel-up    Blood pressure 130/82, pulse 79.   Assessment/Plan:  1. Hypertension -  Hypertension Assessment: BP is 130/82 HR 79 in office BP slightly above the goal (<130/80) Home BP averages 152/70 above goal (<130/80); Question reliability of blood pressure cuff considering she placing at forearm because too snug on arm, will send rx for cuff downstairs Tolerates amlodipine  well without any side effects Denies palpitation, chest pain, headaches,or swelling; Reports some SOB upon exertion Reiterated the importance of regular exercise and low salt diet   Plan:  Continue taking amlodipine  5 mg daily Obtain new BP monitor and log BP for 2 weeks and call PharmD; monitor BP regularly and around same time each day Will consider increasing amlodipine  to 10 mg daily if additional BP control needed.    Thank you  Akeela Busk E. Novie Maggio, Pharm.D [place] [facility]. [facility] [address], [place], [place] [address] Phone: [phone]; Fax: [phone]

## 2024-03-02 ENCOUNTER — Ambulatory Visit: Admitting: Podiatry

## 2024-03-03 ENCOUNTER — Telehealth: Payer: Self-pay

## 2024-03-03 MED ORDER — AMLODIPINE BESYLATE 10 MG PO TABS: 10.0000 mg | ORAL_TABLET | Freq: Every day | ORAL | 3 refills | Status: AC

## 2024-03-03 NOTE — Telephone Encounter (Signed)
 Called patient to check BP readings after our last visit and obtaining new cuff. Currently on amlodipine  5 mg daily. BP readings reported as follows: 149/90 HR 77, 160/88 HR 77, 145/91 HR 73, 160/84 HR 72, 166/76 HR 78, 161/91 HR 69, 158/91 HR 69. She checks blood pressure at 4 AM prior to taking her medication daily. She is usually tired in the afternoons to check blood pressure again. Patient denies chest pain or headache. She reports some dyspnea that occurs occasionally but relieved by taking deep breaths. We discussed ER precautions.   Instructed patient to increase amlodipine  to 10 mg daily. She will take two - 5 mg tablets until she runs out; I will send updated rx to pharmacy. Discussed she will likely need another antihypertensive, likely ARB or chlorthalidone. Encouraged patient to check blood pressure in the afternoons as well - she said she will try.  Scheduled her PharmD f/u appointment and instructed her to bring blood pressure log and new blood pressure monitor. All questions answered and she is to call me if any concerns arise.

## 2024-03-23 ENCOUNTER — Ambulatory Visit: Admitting: Podiatry

## 2024-03-24 ENCOUNTER — Ambulatory Visit
